# Patient Record
Sex: Female | Born: 1935 | Race: White | Hispanic: No | Smoking: Never smoker
Health system: Southern US, Community
[De-identification: ages and names within clinical notes are randomized; demographics above are authoritative.]

## PROBLEM LIST (undated history)

## (undated) DIAGNOSIS — F419 Anxiety disorder, unspecified: Secondary | ICD-10-CM

## (undated) DIAGNOSIS — I10 Essential (primary) hypertension: Secondary | ICD-10-CM

## (undated) DIAGNOSIS — G250 Essential tremor: Secondary | ICD-10-CM

## (undated) DIAGNOSIS — K589 Irritable bowel syndrome without diarrhea: Secondary | ICD-10-CM

## (undated) DIAGNOSIS — F32A Depression, unspecified: Secondary | ICD-10-CM

## (undated) DIAGNOSIS — N189 Chronic kidney disease, unspecified: Secondary | ICD-10-CM

## (undated) HISTORY — PX: EYE SURGERY: SHX253

---

## 2019-10-01 ENCOUNTER — Emergency Department (HOSPITAL_COMMUNITY): Payer: Medicare Other

## 2019-10-01 ENCOUNTER — Inpatient Hospital Stay (HOSPITAL_COMMUNITY)
Admission: EM | Admit: 2019-10-01 | Discharge: 2019-10-07 | DRG: 037 | Disposition: A | Discharge status: Rehabilitation Facility | Payer: Medicare Other | Attending: Internal Medicine | Admitting: Internal Medicine

## 2019-10-01 ENCOUNTER — Encounter (HOSPITAL_COMMUNITY): Payer: Self-pay | Admitting: Emergency Medicine

## 2019-10-01 ENCOUNTER — Inpatient Hospital Stay (HOSPITAL_COMMUNITY): Payer: Medicare Other

## 2019-10-01 ENCOUNTER — Other Ambulatory Visit: Payer: Self-pay

## 2019-10-01 DIAGNOSIS — R54 Age-related physical debility: Secondary | ICD-10-CM | POA: Diagnosis present

## 2019-10-01 DIAGNOSIS — R000 Tachycardia, unspecified: Secondary | ICD-10-CM | POA: Diagnosis not present

## 2019-10-01 DIAGNOSIS — E876 Hypokalemia: Secondary | ICD-10-CM | POA: Diagnosis not present

## 2019-10-01 DIAGNOSIS — I69328 Other speech and language deficits following cerebral infarction: Secondary | ICD-10-CM | POA: Diagnosis not present

## 2019-10-01 DIAGNOSIS — R2689 Other abnormalities of gait and mobility: Secondary | ICD-10-CM | POA: Diagnosis present

## 2019-10-01 DIAGNOSIS — Z66 Do not resuscitate: Secondary | ICD-10-CM | POA: Diagnosis not present

## 2019-10-01 DIAGNOSIS — N1831 Chronic kidney disease, stage 3a: Secondary | ICD-10-CM | POA: Diagnosis not present

## 2019-10-01 DIAGNOSIS — R29703 NIHSS score 3: Secondary | ICD-10-CM | POA: Diagnosis present

## 2019-10-01 DIAGNOSIS — R1310 Dysphagia, unspecified: Secondary | ICD-10-CM | POA: Diagnosis not present

## 2019-10-01 DIAGNOSIS — Z20822 Contact with and (suspected) exposure to covid-19: Secondary | ICD-10-CM | POA: Diagnosis not present

## 2019-10-01 DIAGNOSIS — I6522 Occlusion and stenosis of left carotid artery: Secondary | ICD-10-CM

## 2019-10-01 DIAGNOSIS — E46 Unspecified protein-calorie malnutrition: Secondary | ICD-10-CM | POA: Diagnosis not present

## 2019-10-01 DIAGNOSIS — K6811 Postprocedural retroperitoneal abscess: Secondary | ICD-10-CM | POA: Diagnosis not present

## 2019-10-01 DIAGNOSIS — I69318 Other symptoms and signs involving cognitive functions following cerebral infarction: Secondary | ICD-10-CM | POA: Diagnosis not present

## 2019-10-01 DIAGNOSIS — F329 Major depressive disorder, single episode, unspecified: Secondary | ICD-10-CM | POA: Diagnosis not present

## 2019-10-01 DIAGNOSIS — F411 Generalized anxiety disorder: Secondary | ICD-10-CM | POA: Diagnosis present

## 2019-10-01 DIAGNOSIS — I6389 Other cerebral infarction: Secondary | ICD-10-CM | POA: Diagnosis not present

## 2019-10-01 DIAGNOSIS — Z23 Encounter for immunization: Secondary | ICD-10-CM | POA: Diagnosis not present

## 2019-10-01 DIAGNOSIS — R4701 Aphasia: Secondary | ICD-10-CM | POA: Diagnosis present

## 2019-10-01 DIAGNOSIS — N179 Acute kidney failure, unspecified: Secondary | ICD-10-CM | POA: Diagnosis not present

## 2019-10-01 DIAGNOSIS — F32A Depression, unspecified: Secondary | ICD-10-CM | POA: Diagnosis present

## 2019-10-01 DIAGNOSIS — T148XXA Other injury of unspecified body region, initial encounter: Secondary | ICD-10-CM | POA: Diagnosis not present

## 2019-10-01 DIAGNOSIS — I10 Essential (primary) hypertension: Secondary | ICD-10-CM | POA: Diagnosis present

## 2019-10-01 DIAGNOSIS — R29810 Facial weakness: Secondary | ICD-10-CM | POA: Diagnosis present

## 2019-10-01 DIAGNOSIS — E785 Hyperlipidemia, unspecified: Secondary | ICD-10-CM | POA: Diagnosis present

## 2019-10-01 DIAGNOSIS — N390 Urinary tract infection, site not specified: Secondary | ICD-10-CM | POA: Diagnosis not present

## 2019-10-01 DIAGNOSIS — N189 Chronic kidney disease, unspecified: Secondary | ICD-10-CM | POA: Diagnosis not present

## 2019-10-01 DIAGNOSIS — K5900 Constipation, unspecified: Secondary | ICD-10-CM | POA: Diagnosis not present

## 2019-10-01 DIAGNOSIS — R0989 Other specified symptoms and signs involving the circulatory and respiratory systems: Secondary | ICD-10-CM | POA: Diagnosis not present

## 2019-10-01 DIAGNOSIS — Z8679 Personal history of other diseases of the circulatory system: Secondary | ICD-10-CM | POA: Diagnosis not present

## 2019-10-01 DIAGNOSIS — N183 Chronic kidney disease, stage 3 unspecified: Secondary | ICD-10-CM | POA: Diagnosis not present

## 2019-10-01 DIAGNOSIS — R636 Underweight: Secondary | ICD-10-CM | POA: Diagnosis present

## 2019-10-01 DIAGNOSIS — Z79899 Other long term (current) drug therapy: Secondary | ICD-10-CM

## 2019-10-01 DIAGNOSIS — M069 Rheumatoid arthritis, unspecified: Secondary | ICD-10-CM | POA: Diagnosis present

## 2019-10-01 DIAGNOSIS — G8191 Hemiplegia, unspecified affecting right dominant side: Secondary | ICD-10-CM | POA: Diagnosis present

## 2019-10-01 DIAGNOSIS — L89101 Pressure ulcer of unspecified part of back, stage 1: Secondary | ICD-10-CM | POA: Diagnosis present

## 2019-10-01 DIAGNOSIS — R7303 Prediabetes: Secondary | ICD-10-CM | POA: Diagnosis present

## 2019-10-01 DIAGNOSIS — E8809 Other disorders of plasma-protein metabolism, not elsewhere classified: Secondary | ICD-10-CM | POA: Diagnosis not present

## 2019-10-01 DIAGNOSIS — I129 Hypertensive chronic kidney disease with stage 1 through stage 4 chronic kidney disease, or unspecified chronic kidney disease: Secondary | ICD-10-CM | POA: Diagnosis present

## 2019-10-01 DIAGNOSIS — K580 Irritable bowel syndrome with diarrhea: Secondary | ICD-10-CM | POA: Diagnosis present

## 2019-10-01 DIAGNOSIS — I63312 Cerebral infarction due to thrombosis of left middle cerebral artery: Principal | ICD-10-CM | POA: Diagnosis present

## 2019-10-01 DIAGNOSIS — I69391 Dysphagia following cerebral infarction: Secondary | ICD-10-CM | POA: Diagnosis not present

## 2019-10-01 DIAGNOSIS — D62 Acute posthemorrhagic anemia: Secondary | ICD-10-CM | POA: Diagnosis not present

## 2019-10-01 DIAGNOSIS — G936 Cerebral edema: Secondary | ICD-10-CM | POA: Diagnosis present

## 2019-10-01 DIAGNOSIS — R471 Dysarthria and anarthria: Secondary | ICD-10-CM | POA: Diagnosis present

## 2019-10-01 DIAGNOSIS — Z811 Family history of alcohol abuse and dependence: Secondary | ICD-10-CM

## 2019-10-01 DIAGNOSIS — E43 Unspecified severe protein-calorie malnutrition: Secondary | ICD-10-CM | POA: Diagnosis present

## 2019-10-01 DIAGNOSIS — Z803 Family history of malignant neoplasm of breast: Secondary | ICD-10-CM

## 2019-10-01 DIAGNOSIS — I69322 Dysarthria following cerebral infarction: Secondary | ICD-10-CM | POA: Diagnosis not present

## 2019-10-01 DIAGNOSIS — K661 Hemoperitoneum: Secondary | ICD-10-CM | POA: Diagnosis present

## 2019-10-01 DIAGNOSIS — R1314 Dysphagia, pharyngoesophageal phase: Secondary | ICD-10-CM | POA: Diagnosis present

## 2019-10-01 DIAGNOSIS — I639 Cerebral infarction, unspecified: Secondary | ICD-10-CM | POA: Diagnosis present

## 2019-10-01 DIAGNOSIS — R531 Weakness: Secondary | ICD-10-CM | POA: Diagnosis not present

## 2019-10-01 DIAGNOSIS — I69390 Apraxia following cerebral infarction: Secondary | ICD-10-CM | POA: Diagnosis not present

## 2019-10-01 DIAGNOSIS — Z681 Body mass index (BMI) 19 or less, adult: Secondary | ICD-10-CM | POA: Diagnosis not present

## 2019-10-01 DIAGNOSIS — M720 Palmar fascial fibromatosis [Dupuytren]: Secondary | ICD-10-CM | POA: Diagnosis present

## 2019-10-01 DIAGNOSIS — L8990 Pressure ulcer of unspecified site, unspecified stage: Secondary | ICD-10-CM | POA: Insufficient documentation

## 2019-10-01 DIAGNOSIS — R4789 Other speech disturbances: Secondary | ICD-10-CM | POA: Diagnosis not present

## 2019-10-01 DIAGNOSIS — E871 Hypo-osmolality and hyponatremia: Secondary | ICD-10-CM | POA: Diagnosis present

## 2019-10-01 DIAGNOSIS — G250 Essential tremor: Secondary | ICD-10-CM | POA: Diagnosis present

## 2019-10-01 DIAGNOSIS — I63512 Cerebral infarction due to unspecified occlusion or stenosis of left middle cerebral artery: Secondary | ICD-10-CM

## 2019-10-01 DIAGNOSIS — M3119 Other thrombotic microangiopathy: Secondary | ICD-10-CM | POA: Diagnosis present

## 2019-10-01 DIAGNOSIS — I69351 Hemiplegia and hemiparesis following cerebral infarction affecting right dominant side: Secondary | ICD-10-CM | POA: Diagnosis present

## 2019-10-01 DIAGNOSIS — F419 Anxiety disorder, unspecified: Secondary | ICD-10-CM | POA: Diagnosis not present

## 2019-10-01 DIAGNOSIS — D72829 Elevated white blood cell count, unspecified: Secondary | ICD-10-CM | POA: Diagnosis not present

## 2019-10-01 DIAGNOSIS — I63232 Cerebral infarction due to unspecified occlusion or stenosis of left carotid arteries: Secondary | ICD-10-CM | POA: Diagnosis not present

## 2019-10-01 DIAGNOSIS — Z9889 Other specified postprocedural states: Secondary | ICD-10-CM | POA: Diagnosis not present

## 2019-10-01 HISTORY — DX: Irritable bowel syndrome, unspecified: K58.9

## 2019-10-01 HISTORY — DX: Depression, unspecified: F32.A

## 2019-10-01 HISTORY — DX: Anxiety disorder, unspecified: F41.9

## 2019-10-01 HISTORY — DX: Chronic kidney disease, unspecified: N18.9

## 2019-10-01 HISTORY — DX: Essential (primary) hypertension: I10

## 2019-10-01 HISTORY — DX: Essential tremor: G25.0

## 2019-10-01 LAB — CBC
HCT: 36.2 % (ref 36.0–46.0)
Hemoglobin: 11.2 g/dL — ABNORMAL LOW (ref 12.0–15.0)
MCH: 29.2 pg (ref 26.0–34.0)
MCHC: 30.9 g/dL (ref 30.0–36.0)
MCV: 94.5 fL (ref 80.0–100.0)
Platelets: 363 10*3/uL (ref 150–400)
RBC: 3.83 MIL/uL — ABNORMAL LOW (ref 3.87–5.11)
RDW: 13.8 % (ref 11.5–15.5)
WBC: 6.9 10*3/uL (ref 4.0–10.5)
nRBC: 0 % (ref 0.0–0.2)

## 2019-10-01 LAB — DIFFERENTIAL
Abs Immature Granulocytes: 0.02 10*3/uL (ref 0.00–0.07)
Basophils Absolute: 0.1 10*3/uL (ref 0.0–0.1)
Basophils Relative: 1 %
Eosinophils Absolute: 0.1 10*3/uL (ref 0.0–0.5)
Eosinophils Relative: 1 %
Immature Granulocytes: 0 %
Lymphocytes Relative: 24 %
Lymphs Abs: 1.7 10*3/uL (ref 0.7–4.0)
Monocytes Absolute: 0.7 10*3/uL (ref 0.1–1.0)
Monocytes Relative: 11 %
Neutro Abs: 4.4 10*3/uL (ref 1.7–7.7)
Neutrophils Relative %: 63 %

## 2019-10-01 LAB — I-STAT CHEM 8, ED
BUN: 24 mg/dL — ABNORMAL HIGH (ref 8–23)
Calcium, Ion: 1.18 mmol/L (ref 1.15–1.40)
Chloride: 104 mmol/L (ref 98–111)
Creatinine, Ser: 1.4 mg/dL — ABNORMAL HIGH (ref 0.44–1.00)
Glucose, Bld: 106 mg/dL — ABNORMAL HIGH (ref 70–99)
HCT: 35 % — ABNORMAL LOW (ref 36.0–46.0)
Hemoglobin: 11.9 g/dL — ABNORMAL LOW (ref 12.0–15.0)
Potassium: 4.7 mmol/L (ref 3.5–5.1)
Sodium: 137 mmol/L (ref 135–145)
TCO2: 22 mmol/L (ref 22–32)

## 2019-10-01 LAB — COMPREHENSIVE METABOLIC PANEL
ALT: 15 U/L (ref 0–44)
AST: 22 U/L (ref 15–41)
Albumin: 3.8 g/dL (ref 3.5–5.0)
Alkaline Phosphatase: 75 U/L (ref 38–126)
Anion gap: 4 — ABNORMAL LOW (ref 5–15)
BUN: 24 mg/dL — ABNORMAL HIGH (ref 8–23)
CO2: 22 mmol/L (ref 22–32)
Calcium: 9.8 mg/dL (ref 8.9–10.3)
Chloride: 111 mmol/L (ref 98–111)
Creatinine, Ser: 1.44 mg/dL — ABNORMAL HIGH (ref 0.44–1.00)
GFR calc Af Amer: 37 mL/min — ABNORMAL LOW (ref >60)
GFR calc non Af Amer: 32 mL/min — ABNORMAL LOW (ref >60)
Glucose, Bld: 112 mg/dL — ABNORMAL HIGH (ref 70–99)
Potassium: 4.7 mmol/L (ref 3.5–5.1)
Sodium: 137 mmol/L (ref 135–145)
Total Bilirubin: 0.7 mg/dL (ref 0.3–1.2)
Total Protein: 7.4 g/dL (ref 6.5–8.1)

## 2019-10-01 LAB — APTT: aPTT: 25 seconds (ref 24–36)

## 2019-10-01 LAB — RESPIRATORY PANEL BY RT PCR (FLU A&B, COVID)
Influenza A by PCR: NEGATIVE
Influenza B by PCR: NEGATIVE
SARS Coronavirus 2 by RT PCR: NEGATIVE

## 2019-10-01 LAB — HEMOGLOBIN A1C
Hgb A1c MFr Bld: 5.7 % — ABNORMAL HIGH (ref 4.8–5.6)
Mean Plasma Glucose: 116.89 mg/dL

## 2019-10-01 LAB — PROTIME-INR
INR: 1 (ref 0.8–1.2)
Prothrombin Time: 13.2 seconds (ref 11.4–15.2)

## 2019-10-01 LAB — CBG MONITORING, ED: Glucose-Capillary: 103 mg/dL — ABNORMAL HIGH (ref 70–99)

## 2019-10-01 MED ORDER — FLUOXETINE HCL 10 MG PO CAPS: 10.0000 mg | ORAL_CAPSULE | Freq: Every day | ORAL | Status: DC

## 2019-10-01 MED ORDER — SENNOSIDES-DOCUSATE SODIUM 8.6-50 MG PO TABS: 1.0000 | ORAL_TABLET | Freq: Every evening | ORAL | Status: DC | PRN

## 2019-10-01 MED ORDER — ATORVASTATIN CALCIUM 80 MG PO TABS
80.0000 mg | ORAL_TABLET | Freq: Every day | ORAL | Status: DC
  Administered 2019-10-01 – 2019-10-07 (×6): 80 mg via ORAL
  Filled 2019-10-01 (×5): qty 1

## 2019-10-01 MED ORDER — ASPIRIN 81 MG PO CHEW
81.0000 mg | CHEWABLE_TABLET | Freq: Every day | ORAL | Status: DC
  Administered 2019-10-01 – 2019-10-03 (×3): 81 mg via ORAL
  Filled 2019-10-01 (×3): qty 1

## 2019-10-01 MED ORDER — FLUOXETINE HCL 10 MG PO CAPS
10.0000 mg | ORAL_CAPSULE | Freq: Every day | ORAL | Status: DC
  Administered 2019-10-02 – 2019-10-07 (×6): 10 mg via ORAL
  Filled 2019-10-01 (×7): qty 1

## 2019-10-01 MED ORDER — ACETAMINOPHEN 650 MG RE SUPP: 650.0000 mg | RECTAL | Status: DC | PRN

## 2019-10-01 MED ORDER — STROKE: EARLY STAGES OF RECOVERY BOOK: Freq: Once | Status: DC

## 2019-10-01 MED ORDER — ACETAMINOPHEN 160 MG/5ML PO SOLN: 650.0000 mg | ORAL | Status: DC | PRN

## 2019-10-01 MED ORDER — SODIUM CHLORIDE 0.9% FLUSH
3.0000 mL | Freq: Once | INTRAVENOUS | Status: AC
  Administered 2019-10-01: 3 mL via INTRAVENOUS

## 2019-10-01 MED ORDER — ACETAMINOPHEN 325 MG PO TABS
650.0000 mg | ORAL_TABLET | ORAL | Status: DC | PRN
  Administered 2019-10-05 – 2019-10-06 (×2): 650 mg via ORAL
  Filled 2019-10-01 (×2): qty 2

## 2019-10-01 MED ORDER — IOHEXOL 350 MG/ML SOLN
100.0000 mL | Freq: Once | INTRAVENOUS | Status: AC | PRN
  Administered 2019-10-01: 100 mL via INTRAVENOUS

## 2019-10-01 MED ORDER — LACTATED RINGERS IV SOLN: INTRAVENOUS | Status: AC

## 2019-10-01 MED ORDER — ASPIRIN 300 MG RE SUPP: 300.0000 mg | Freq: Every day | RECTAL | Status: DC

## 2019-10-01 MED ORDER — ASPIRIN 325 MG PO TABS: 325.0000 mg | ORAL_TABLET | Freq: Every day | ORAL | Status: DC

## 2019-10-01 NOTE — Hospital Course (Addendum)
Hospital Course:  Acute Stroke: Patient presented to the [facility] on 10/01/19 after waking up with new onset R-sided weakness and difficulty speaking. Last known well time was >12 hours prior. Code stroke was called and CT head demonstrated a L parietal infarct; findings corroborated by MRI Brain. CTA neck revealed critical stenosis >90% of L internal carotid artery; findings corroborated by Vascular US carotid. TTE showed LVEF 70-75%, Grade I diastolic dysfunction, no valvular pathology, and no identified source of embolism. Patient was found to have profound weakness of RUE and moderate dysarthria with slurring of speech. Strength and sensation were intact elsewhere. Patient was out of the therapeutic window for thrombolysis. Permissive hypertension protocol was observed and patient received appropriate medical management with ASA, Plavix, and a statin. On 9/30, the patient's dysarthria and weakness were noted to be acutely worse but CT head was unchanged. It was determined that her worsened symptoms were likely related to post-stroke vasogenic edema and expected to improve with time.  Patient underwent L carotid endarterectomy on 10/1. On 10/2 her strength and speech were improved compared to previous. PT/OT/Speech recommended inpatient rehabilitation and patient was referred to CIR.    Follow-up recommendations:  Acute stroke:  Patient was living independently prior to stroke. Planning of moving in with her son and daughter-in-law after inpatient rehab.  - Reassess ADLs/IADLs  MDD: Anxiety: Patient with baseline depression/anxiety and at high risk of post-stroke depression.  - Suggest PHQ-9 at follow-up   HLD: Patient was previously on cholestyramine monotherapy. Started on atorvastatin 80mg  daily s/p stroke. - Recommend follow-up Lipid panel in 6-8 weeks.   Abnormal CT finding: CTA neck showed prominent pannus posterior to the dens with subluxation of C1 on C2 in a patient with ?history of  rheumatoid arthritis. Patient asymptomatic. - Consider rheumatology follow-up

## 2019-10-01 NOTE — Consult Note (Addendum)
Neurology Consult H&P  CC: Slurred speech, right sided weakness (face, arm, leg) and unable to walk this morning.  History is obtained from: Patient, chart, EMS, conveyed by her son.  HPI: Elizabeth Tapia is a 86 y.o. female who lives alone was found by her son to have slurred speech and right sided weakness. This was not her normal baseline and EMS called.   At baseline, she is independent and in good health which was the case when she went to bed last night.   LKW: 2000 on 09/30/2019 tpa given?: No, due to outside the window and possible hemorrhage. IR Thrombectomy? No Modified Rankin Scale: 1-No significant post stroke disability and can perform usual duties with stroke symptoms NIHSS: 3  ROS: A complete ROS was performed and is negative except as noted in the HPI.  History reviewed. No pertinent past medical history.  History reviewed. No pertinent family history.  Social History:  has no history on file for tobacco use, alcohol use, and drug use.  Prior to Admission medications   Not on File   Exam: Current vital signs: BP (!) 148/78   Pulse 88   Temp 98.4 F (36.9 C) (Oral)   Resp 17   SpO2 99%   Physical Exam  Constitutional: Appears well-developed and well-nourished.  Psych: Affect appropriate to situation Eyes: No scleral injection HENT: No OP obstrucion Head: Normocephalic.  Cardiovascular: Normal rate and regular rhythm.  Respiratory: Effort normal and breath sounds normal to anterior ascultation GI: Soft.  No distension. There is no tenderness.  Skin: WDI  Neuro: Mental Status: Patient is awake, alert, oriented to person, place, month, year, and situation. Patient is able to give a clear and coherent history. No signs of aphasia or neglect Cranial Nerves: II: Visual Fields are full. Pupils are equal, round, and reactive to light.   III,IV, VI: EOMI without ptosis or diploplia.  V: Facial sensation is symmetric to temperature VII: Facial movement is  symmetric.  VIII: hearing is intact to voice X: Uvula elevates symmetrically XI: Shoulder shrug is symmetric. XII: tongue is midline without atrophy or fasciculations.  Motor: Tone is normal. Bulk is normal. 5/5 strength was present in all four extremities.  Sensory: Sensation is symmetric to light touch and temperature in the arms and legs. Deep Tendon Reflexes: 2+ and symmetric in the biceps and patellae.  Plantars: Toes are downgoing bilaterally.  Cerebellar: FNF and HKS are intact bilaterally  I have reviewed the images obtained: Hypodensity left parietal cortex suggesting age indeterminate stroke. Small associated hyperdensity could represent hemorrhage. CTA neck showed critical stenosis in proximal left internal carotid artery >90%.     Primary Diagnosis:  Cerebral infarction due to thrombosis of left middle cerebral artery. - End artery.  Impression: 86 year old woman with stroke causing right sided weakness. The patient was coherent and able to state she thought she had a stroke. CT scan suggested possible petechial hemorrhage. CTA neck with proximal left internal carotid artery stenosis >90% may be likely source of stroke noted on CT and the patient will need further evaluation.  Plan: - Brain MRI.  - Carotid doppler. - Vascular surgery evaluation as this may be symptomatic stenosis. - Recommend TTE. - Recommend ordering Lipid panel. - Recommend Statin if LDL > 70 - Recommend obtaining HbA1c. - Aspirin 81mg  daily. - SBP goal - permissive hypertension first 24 h < 220/110. Hold home meds.  - Telemetry monitoring for arrhythmia. - Recommend bedside Swallow screen. - Recommend Stroke educatio -  Recommend PT/OT/SLP consult. - SCDs for now and consider chemical PPx if MRI is stable.  Discussed plan with ED and admission to hospitalist service.  This patient is critically ill and at significant risk of neurological worsening, death and care requires constant monitoring  of vital signs, hemodynamics,respiratory and cardiac monitoring, neurological assessment, discussion with family, other specialists and medical decision making of high complexity. I spent 75 minutes of neurocritical care time  in the care of  this patient. This was time spent independent of any time provided by nurse practitioner or PA.  Electronically signed by: Dr. Marisue Humble Pager: [phone]

## 2019-10-01 NOTE — H&P (Addendum)
Date: 10/01/2019               Patient Name:  Elizabeth Tapia MRN: [id number]  DOB: 02-25-1929 Age / Sex: 86 y.o., female   PCP: Tarri Fuller, MD              Medical Service: Internal Medicine Teaching Service              Attending Physician: Dr. Harden Mo    First Contact: Dr. Glenford Bayley Pager: [phone]  Second Contact: Dr. Lum Babe Pager: [phone]            After Hours (After 5p/  First Contact Pager: [phone]  weekends / holidays): Second Contact Pager: [phone]   Chief Complaint: Slurred speech, R sided weakness  History of Present Illness: History provided by patient and corroborated by her son. Elizabeth Tapia is an 86 year old female with a history of hypertension, hyperlipidemia, CKD IIIa, ?rheumatoid arthritis, anxiety, and depression who presents with an acute stroke. Patient was in her usual state of health last night and awoke this morning with slurred speech and R sided weakness. She was unable to walk. She lives with her grandson and called to him for help. She has no history of prior stroke or known vascular disease.  Her son reports that her speech is noticeably slurred compared to her baseline and that there is new facial asymmetry. She has a tremor most noticeable in her R arm that "has been there a long time" but is appreciably worse today.  Tremor is present at rest.  She denies vision changes, chest pain, SOB, or numbness.  She denies any dysuria, urinary frequency, or changes in bowel movements. She does endorse a slightly depressed appetite over the past few days and states that she "might have had a fever a few days ago, but it didn't amount to anything." She denies any cough, URI symptoms, or sick contacts. She is not vaccinated against COVID-19, but is willing to get her vaccine while in the hospital.   Brief ED Course- Code Stroke was activated and patient underwent CT w/o contrast which showed hypodensity of the L parietal cortex suggesting  stroke of indeterminate age and a small associated hyperdensity that could represent hemorrhage. CTA neck showed critical stenosis in proximal L internal carotid artery >90%. Because patient's last known well time was >12 hours ago and the possibility of hemorrhage, TPA was not administered. Patient admitted to IM teaching service for stroke workup.   Meds:  Current Meds  Medication Sig  . acetaminophen (TYLENOL) 500 MG tablet Take 500 mg by mouth every 6 (six) hours as needed for mild pain.  . cholestyramine (QUESTRAN) 4 g packet Take 1 packet by mouth 3 (three) times daily.  . diazepam (VALIUM) 2 MG tablet Take 2 mg by mouth every 12 (twelve) hours as needed for anxiety.  Marland Kitchen FLUoxetine (PROZAC) 10 MG capsule Take 10 mg by mouth daily.  . propranolol (INDERAL) 60 MG tablet Take 60 mg by mouth 2 (two) times daily.     Allergies: Allergies as of 10/01/2019  . (No Known Allergies)   History reviewed. No pertinent past medical history.  Family History: Sister died of breast CA. No other known family history of CA, stroke, or heart disease.   Social History: Lives with grandson but is mostly independent. Manages her own medications but is closely monitored by a son who lives nearby.  No etOH, tobacco, or drug use.   Review of Systems: A  complete ROS was negative except as per HPI.   Physical Exam: Blood pressure (!) 167/68, pulse 91, temperature 98.4 F (36.9 C), temperature source Oral, resp. rate 20, SpO2 98 %. Physical Exam Vitals reviewed.  Constitutional:      General: She is not in acute distress. Eyes:     Extraocular Movements: Extraocular movements intact.     Pupils: Pupils are equal, round, and reactive to light.  Neck:     Comments: Carotid bruit noted on L side. No bruit appreciated on R side.  Cardiovascular:     Rate and Rhythm: Normal rate and regular rhythm.  Neurological:     Mental Status: She is alert.     Cranial Nerves: Facial asymmetry (droop of lower R  face) present.     Sensory: Sensation is intact.     Motor: Tremor (R upper extremity) present.     Comments: Strength 3/5 in R upper extremity Strength 5/5 in LUE and bilateral LE Patient unable to perform finger to nose test due to tremor but heel to shin test was normal.  Psychiatric:        Mood and Affect: Mood and affect normal.        Speech: Speech is slurred.        Behavior: Behavior normal.        Thought Content: Thought content normal.     EKG: personally reviewed my interpretation is normal sinus rhythm.  Assessment & Plan by Problem: Elizabeth Tapia is an 86 yo woman with a history of HTN, HLD, CKD IIIa, anxiety, and depression admitted for workup of a stroke.  Active Problems:   Stroke [facility])  Stroke: Patient with hypertension and hyperlipidemia presenting with new onset slurred speech and R sided weakness. CT head consistent with ischemic stroke and possible petechial hemorrhage. CTA neck with critical stenosis of L carotid artery >90% and audible L-sided carotid bruit on exam. This may be the source of embolic stroke and warrants further workup to mitigate further stroke.  - Neurology following, appreciate their recommendations for the following:  - Brain MRI.   -Carotid doppler.  - Vascular surgery evaluation as this may be symptomatic stenosis.  -TTE.  - SCDs for now and consider chemical PPx if MRI is stable -A1c today - Initiate 81mg  daily. - Permissive hypertension first 24 h < 220/110. Hold home meds.  - Telemetry monitoring for arrhythmia. -Swallow study  - Diet per SLP -PT/OT/SLP consult.  Hyperlipidemia: - Hold home cholestyramine for now.  - Lipid panel  - Add statin if LDL >70  Hypertension: CKD IIIa: - Hold home propranolol during permissive hypertensive period as above  Depression: Anxiety: - Continue home Prozac 10mg  daily  Dispo: Admit patient to Inpatient with expected length of stay greater than 2 midnights.  Signed: , Medical Student 10/01/2019, 4:07 PM  Pager: [phone]  Attestation for Student Documentation:  I personally was present and performed or re-performed the history, physical exam and medical decision-making activities of this service and have verified that the service and findings are accurately documented in the student's note.  10/03/2019, MD 10/01/2019, 5:12 PM

## 2019-10-01 NOTE — ED Triage Notes (Signed)
Pt here via [facility] as code stroke. Pt have trouble ambulating, slurred speech, right sided weakness and right sided facial droop. Pt LKW was 9/27 2000.

## 2019-10-01 NOTE — ED Notes (Signed)
Attempted to call report x1, states that charge nurse needs to review

## 2019-10-01 NOTE — Progress Notes (Signed)
REASON FOR CONSULT:    Symptomatic left carotid stenosis.  The consult is requested by neurology.  ASSESSMENT & PLAN:   SYMPTOMATIC LEFT CAROTID STENOSIS: This patient presents with a left brain stroke and is found to have a very tight left carotid stenosis.  Depending upon how she does over the next few days she may be a candidate for left carotid endarterectomy.  She seems fairly with it for her age.  She has been started on aspirin.  I do not think that she is on a statin which I would recommend.  I would also recommend considering IV heparin given the severity of the stenosis if neurology does not think there are any signs of hemorrhage.  I will follow and consider left carotid endarterectomy.  I have discussed this with the patient.  We discussed the procedure potential complications.  She seems to understand.  Her carotid duplex and echocardiogram have been ordered but are pending.   Waverly Ferrari, MD Office: [phone]   HPI:   Elizabeth Tapia is a pleasant 86 y.o. female, who tells me that she developed the sudden onset of right-sided weakness at 10 PM last night.  However the records say this occurred this morning.  She reportedly had right sided weakness with slurred speech and facial droop on the right.  She lives independently but tells me that she has help at her home most of the time.  She is widowed.  She is right-handed.  She denies any previous history of stroke, TIAs, expressive or receptive aphasia, or amaurosis fugax.  She tells me that she occasionally takes aspirin but not routinely.  She is not on a statin.  She has no real risk factors for peripheral vascular disease.  She denies any history of diabetes, hypertension, hypercholesterolemia, family history of premature cardiovascular disease, or smoking history.  History reviewed. No pertinent past medical history.  History reviewed. No pertinent family history.  SOCIAL HISTORY: She is widowed.  She has 3  children 2 of which are living.  She is not a smoker. Social History   Socioeconomic History  . Marital status: Unknown    Spouse name: Not on file  . Number of children: Not on file  . Years of education: Not on file  . Highest education level: Not on file  Occupational History  . Not on file  Tobacco Use  . Smoking status: Not on file  Substance and Sexual Activity  . Alcohol use: Not on file  . Drug use: Not on file  . Sexual activity: Not on file  Other Topics Concern  . Not on file  Social History Narrative  . Not on file   Social Determinants of Health   Financial Resource Strain:   . Difficulty of Paying Living Expenses: Not on file  Food Insecurity:   . Worried About Programme researcher, broadcasting/film/video in the Last Year: Not on file  . Ran Out of Food in the Last Year: Not on file  Transportation Needs:   . Lack of Transportation (Medical): Not on file  . Lack of Transportation (Non-Medical): Not on file  Physical Activity:   . Days of Exercise per Week: Not on file  . Minutes of Exercise per Session: Not on file  Stress:   . Feeling of Stress : Not on file  Social Connections:   . Frequency of Communication with Friends and Family: Not on file  . Frequency of Social Gatherings with Friends and Family: Not on file  .  Attends Religious Services: Not on file  . Active Member of Clubs or Organizations: Not on file  . Attends Banker Meetings: Not on file  . Marital Status: Not on file  Intimate Partner Violence:   . Fear of Current or Ex-Partner: Not on file  . Emotionally Abused: Not on file  . Physically Abused: Not on file  . Sexually Abused: Not on file    No Known Allergies  Current Facility-Administered Medications  Medication Dose Route Frequency Provider Last Rate Last Admin  .  stroke: mapping our early stages of recovery book   Does not apply Once Masoudi, Shawna Orleans, MD      . acetaminophen (TYLENOL) tablet 650 mg  650 mg Oral Q4H PRN Masoudi,  Elhamalsadat, MD       Or  . acetaminophen (TYLENOL) 160 MG/5ML solution 650 mg  650 mg Per Tube Q4H PRN Masoudi, Elhamalsadat, MD       Or  . acetaminophen (TYLENOL) suppository 650 mg  650 mg Rectal Q4H PRN Masoudi, Elhamalsadat, MD      . aspirin chewable tablet 81 mg  81 mg Oral Daily Masoudi, Elhamalsadat, MD   81 mg at 10/01/19 1835  . FLUoxetine (PROZAC) capsule 10 mg  10 mg Oral Daily Masoudi, Elhamalsadat, MD      . senna-docusate (Senokot-S) tablet 1 tablet  1 tablet Oral QHS PRN Masoudi, Elhamalsadat, MD      . sodium chloride flush (NS) 0.9 % injection 3 mL  3 mL Intravenous Once Cathren Laine, MD       Current Outpatient Medications  Medication Sig Dispense Refill  . acetaminophen (TYLENOL) 500 MG tablet Take 500 mg by mouth every 6 (six) hours as needed for mild pain.    . cholestyramine (QUESTRAN) 4 g packet Take 1 packet by mouth 3 (three) times daily.    . diazepam (VALIUM) 2 MG tablet Take 2 mg by mouth every 12 (twelve) hours as needed for anxiety.    Marland Kitchen FLUoxetine (PROZAC) 10 MG capsule Take 10 mg by mouth daily.    . propranolol (INDERAL) 60 MG tablet Take 60 mg by mouth 2 (two) times daily.      REVIEW OF SYSTEMS:  [X]  denotes positive finding, [ ]  denotes negative finding Cardiac  Comments:  Chest pain or chest pressure:    Shortness of breath upon exertion: x   Short of breath when lying flat:    Irregular heart rhythm:        Vascular    Pain in calf, thigh, or hip brought on by ambulation:    Pain in feet at night that wakes you up from your sleep:     Blood clot in your veins:    Leg swelling:         Pulmonary    Oxygen at home:    Productive cough:     Wheezing:         Neurologic    Sudden weakness in arms or legs:  x  right arm  Sudden numbness in arms or legs:  x  right arm  Sudden onset of difficulty speaking or slurred speech:    Temporary loss of vision in one eye:     Problems with dizziness:         Gastrointestinal    Blood in stool:      Vomited blood:         Genitourinary    Burning when urinating:     Blood  in urine:        Psychiatric    Major depression:         Hematologic    Bleeding problems:    Problems with blood clotting too easily:        Skin    Rashes or ulcers:        Constitutional    Fever or chills:     PHYSICAL EXAM:   Vitals:   10/01/19 1615 10/01/19 1800 10/01/19 1815 10/01/19 1830  BP: (!) 161/59 (!) 154/81 (!) 163/86 (!) 164/79  Pulse: 85 100 100 (!) 102  Resp: 18 (!) 23    Temp:      TempSrc:      SpO2: 99% 97% 96% 96%    GENERAL: The patient is a well-nourished female, in no acute distress. The vital signs are documented above. CARDIAC: There is a regular rate and rhythm.  VASCULAR: I do not detect carotid bruits. She has palpable femoral, popliteal, dorsalis pedis, and posterior tibial pulses bilaterally. She has no significant lower extremity swelling. PULMONARY: There is good air exchange bilaterally without wheezing or rales. ABDOMEN: Soft and non-tender with normal pitched bowel sounds.  MUSCULOSKELETAL: There are no major deformities or cyanosis. NEUROLOGIC: She has significant right upper extremity weakness.  She has no significant lower extremity weakness.  She has good strength on the left side. SKIN: There are no ulcers or rashes noted. PSYCHIATRIC: The patient has a normal affect.  DATA:    CT ANGIOGRAM NECK: I have reviewed her CT angiogram of the neck.  She has a very tight possible short segment occlusion of the proximal ICA on the left.  There is no significant right carotid stenosis.  CT angiogram of the head shows possible distal arterial occlusion in the left MCA territory.  She has an area of hypoperfusion in the left parietal lobe corresponding to the area of hypodensity seen on her CT of the head.  CT HEAD: CT of the head shows a hypodensity in the left parietal cortex compatible with infarction.  MR BRAIN: I have reviewed her MRI of the brain.  This  shows multiple foci of acute infarcts within the left frontal and parietal lobes in a watershed distribution.  The most confluent area of acute infarct is in the left parietal lobe in the distal left MCA territory.  This corresponds with the finding on CT.  EKG: She has a normal sinus rhythm.

## 2019-10-01 NOTE — Progress Notes (Addendum)
Discussed Elizabeth Tapia with on-call neurology team regarding vascular surgery's recommendation for considering IV heparin until carotid endarterectomy. Advised to not initiate heparin at this time due to risk of hemorrhage and to maintain permissive hypertension for increased cerebral perfusion. Fluids started to keep bp elevated.

## 2019-10-01 NOTE — ED Notes (Signed)
Transportation requested  

## 2019-10-01 NOTE — Code Documentation (Signed)
Patient from home where she lives alone and her son checks up on her. She is typically very independent. She was LKW last night 09/30/19 at 2000. This am she was found unable to walk, right sided weakness, speech slurred and a right facial droop. [facility] was called and a code stroke was paged out. Pt taken to [facility] and met by RN and SRN. Neurologist met pt in [place]. A CT/CTA/CTP all completed. Pt's initial NIHSS 3 for right drift in upper and lower and mild dysarthria. BP 173/64, CBG WNL. She is outside the window for TPA and her scans were negative for LVO per MD. Care Plan: q2x12 then q4 vitals/mNIHSS. Hand off with Deidre Ala, RN. Elizabeth Tapia, Elizabeth Brunt, RN

## 2019-10-01 NOTE — ED Notes (Signed)
Pt remains in MRI 

## 2019-10-01 NOTE — ED Notes (Signed)
Attempted to call report x2

## 2019-10-01 NOTE — ED Provider Notes (Addendum)
[facility] EMERGENCY DEPARTMENT Provider Note   CSN: [id number] Arrival date & time: 10/01/19  1029     History Chief Complaint  Patient presents with  . Cerebrovascular Accident    Elizabeth Tapia is a 86 y.o. female.  Patient arrives as code stroke activation - patient noted to have right sided weakness, and slurred speech this AM. Symptoms acute onset, moderate, constant, persistent. Patient was last noted to be at her baseline around 8 PM last night. Pt denies headache. No chest pain or discomfort. No sob. Denies change in vision. Denies numbness. Pt does not recall having similar symptoms previously.   The history is provided by the patient and the EMS personnel. The history is limited by the condition of the patient.       History reviewed. No pertinent past medical history.  There are no problems to display for this patient.   History reviewed. No pertinent surgical history.   OB History   No obstetric history on file.     History reviewed. No pertinent family history.  Social History   Tobacco Use  . Smoking status: Not on file  Substance Use Topics  . Alcohol use: Not on file  . Drug use: Not on file    Home Medications Prior to Admission medications   Not on File    Allergies    Patient has no allergy information on record.  Review of Systems   Review of Systems  Constitutional: Negative for chills and fever.  HENT: Negative for trouble swallowing.   Eyes: Negative for visual disturbance.  Respiratory: Negative for shortness of breath.   Cardiovascular: Negative for chest pain.  Gastrointestinal: Negative for abdominal pain.  Genitourinary: Negative for dysuria.  Musculoskeletal: Negative for back pain and neck pain.  Skin: Negative for rash.  Neurological: Positive for weakness. Negative for headaches.  Hematological: Does not bruise/bleed easily.  Psychiatric/Behavioral: Negative for agitation.    Physical  Exam Updated Vital Signs BP (!) 173/64   Pulse 97   Temp 98.4 F (36.9 C)   Physical Exam Vitals and nursing note reviewed.  Constitutional:      Appearance: Normal appearance. She is well-developed.  HENT:     Head: Atraumatic.     Nose: Nose normal.     Mouth/Throat:     Mouth: Mucous membranes are moist.  Eyes:     General: No scleral icterus.    Conjunctiva/sclera: Conjunctivae normal.     Pupils: Pupils are equal, round, and reactive to light.  Neck:     Vascular: No carotid bruit.     Trachea: No tracheal deviation.  Cardiovascular:     Rate and Rhythm: Normal rate and regular rhythm.     Pulses: Normal pulses.     Heart sounds: Normal heart sounds. No murmur heard.  No friction rub. No gallop.   Pulmonary:     Effort: Pulmonary effort is normal. No respiratory distress.     Breath sounds: Normal breath sounds.  Abdominal:     General: Bowel sounds are normal. There is no distension.     Palpations: Abdomen is soft.     Tenderness: There is no abdominal tenderness. There is no guarding.  Genitourinary:    Comments: No cva tenderness.  Musculoskeletal:        General: No swelling.     Cervical back: Normal range of motion and neck supple. No rigidity. No muscular tenderness.  Skin:    General: Skin is warm and  dry.     Findings: No rash.  Neurological:     Mental Status: She is alert.     Comments: Alert, speech relatively clear ?mild dysarthria. Right sided weakness. Right pronator drift.   Psychiatric:        Mood and Affect: Mood normal.     ED Results / Procedures / Treatments   Labs (all labs ordered are listed, but only abnormal results are displayed) Results for orders placed or performed during the hospital encounter of 10/01/19  Protime-INR  Result Value Ref Range   Prothrombin Time 13.2 11.4 - 15.2 seconds   INR 1.0 0.8 - 1.2  APTT  Result Value Ref Range   aPTT 25 24 - 36 seconds  CBC  Result Value Ref Range   WBC 6.9 4.0 - 10.5 K/uL    RBC 3.83 (L) 3.87 - 5.11 MIL/uL   Hemoglobin 11.2 (L) 12.0 - 15.0 g/dL   HCT 78.2 36 - 46 %   MCV 94.5 80.0 - 100.0 fL   MCH 29.2 26.0 - 34.0 pg   MCHC 30.9 30.0 - 36.0 g/dL   RDW 95.6 21.3 - 08.6 %   Platelets 363 150 - 400 K/uL   nRBC 0.0 0.0 - 0.2 %  Differential  Result Value Ref Range   Neutrophils Relative % 63 %   Neutro Abs 4.4 1.7 - 7.7 K/uL   Lymphocytes Relative 24 %   Lymphs Abs 1.7 0.7 - 4.0 K/uL   Monocytes Relative 11 %   Monocytes Absolute 0.7 0 - 1 K/uL   Eosinophils Relative 1 %   Eosinophils Absolute 0.1 0 - 0 K/uL   Basophils Relative 1 %   Basophils Absolute 0.1 0 - 0 K/uL   Immature Granulocytes 0 %   Abs Immature Granulocytes 0.02 0.00 - 0.07 K/uL  CBG monitoring, ED  Result Value Ref Range   Glucose-Capillary 103 (H) 70 - 99 mg/dL  I-stat chem 8, ED  Result Value Ref Range   Sodium 137 135 - 145 mmol/L   Potassium 4.7 3.5 - 5.1 mmol/L   Chloride 104 98 - 111 mmol/L   BUN 24 (H) 8 - 23 mg/dL   Creatinine, Ser 5.78 (H) 0.44 - 1.00 mg/dL   Glucose, Bld 469 (H) 70 - 99 mg/dL   Calcium, Ion 6.29 5.28 - 1.40 mmol/L   TCO2 22 22 - 32 mmol/L   Hemoglobin 11.9 (L) 12.0 - 15.0 g/dL   HCT 41.3 (L) 36 - 46 %   CT CEREBRAL PERFUSION W CONTRAST  Result Date: 10/01/2019 CLINICAL DATA:  Acute neuro deficit. Right facial droop and right-sided weakness. Slurred speech. EXAM: CT ANGIOGRAPHY HEAD AND NECK TECHNIQUE: Multidetector CT imaging of the head and neck was performed using the standard protocol during bolus administration of intravenous contrast. Multiplanar CT image reconstructions and MIPs were obtained to evaluate the vascular anatomy. Carotid stenosis measurements (when applicable) are obtained utilizing NASCET criteria, using the distal internal carotid diameter as the denominator. CONTRAST:  OMNIPAQUE IOHEXOL 350 MG/ML SOLN COMPARISON:  CT head 10/01/2019 FINDINGS: CTA NECK FINDINGS Aortic arch: Atherosclerotic aortic arch. Proximal great vessels  widely patent. 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 Right carotid system: Mild atherosclerotic disease right carotid bifurcation without significant stenosis. Left carotid system: High-grade stenosis proximal left internal carotid artery with irregular lumen. This may be soft plaque or plaque rupture. This could be a source of emboli. Moderate stenosis proximal left external carotid artery. Vertebral arteries: Both  vertebral arteries widely patent to the basilar without stenosis. Skeleton: Cervical spondylosis.  No acute skeletal abnormality. Other neck: Prominent pannus posterior to the dens compatible with arthropathy. This is causing mild flattening of the ventral cord and mild spinal stenosis. Rule out rheumatoid arthritis or CPPD. No bony erosion identified. C1 is subluxed to the right which is likely due to ligament laxity. No fracture identified. Upper chest: Apical scarring bilaterally. No acute abnormality in the lung apices. Review of the MIP images confirms the above findings CTA HEAD FINDINGS Anterior circulation: Mild atherosclerotic disease in the cavernous carotid bilaterally without significant stenosis. Right middle cerebral artery widely patent. Hypoplastic left A1 segment. Both anterior cerebral arteries are patent from the right. Left M1 segment widely patent. No large vessel occlusion. There is an area of hypoperfusion in the left parietal lobe where there is an area of infarct on CT. This could be acute. Posterior circulation: Both vertebral arteries patent to the basilar. PICA patent bilaterally. Basilar widely patent. Right AICA patent. Superior cerebellar and posterior cerebral arteries patent bilaterally without stenosis or large vessel occlusion. Venous sinuses: Normal venous enhancement. Anatomic variants: None Review of the MIP images confirms the above findings IMPRESSION: 1. 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 String sinus  present with plaque irregularity. Possible source of emboli. 2. Area of hypoperfusion left parietal lobe on CTA corresponding to an area of hypodensity on CT. Possible distal arterial occlusion left MCA territory. I am not able to identify definite acute vessel occlusion. 3. Prominent pannus posterior to the dens with subluxation of C1 on C2 compatible with arthropathy and instability. Possible CPPD or rheumatoid arthritis. 4. These results were called by telephone at the time of interpretation on 10/01/2019 at 11:18 am to provider [facility] , who verbally acknowledged these results. Electronically Signed   By: Marlan Palau M.D.   On: 10/01/2019 11:19   CT HEAD CODE STROKE WO CONTRAST  Result Date: 10/01/2019 CLINICAL DATA:  Code stroke. Acute neuro deficit. Right facial droop and right-sided weakness. Slurred speech. EXAM: CT HEAD WITHOUT CONTRAST TECHNIQUE: Contiguous axial images were obtained from the base of the skull through the vertex without intravenous contrast. COMPARISON:  None. FINDINGS: Brain: Generalized atrophy. Extensive white matter hypodensity bilaterally. Hypodensity in the left parietal compatible with infarct possibly acute or subacute. This is approximately 2 cm in size. Small area of increased density within the hypodensity could represent acute hemorrhage. Vascular: Negative for hyperdense vessel Skull: Negative Sinuses/Orbits: Chronic bony thickening of the sphenoid sinus with associated air-fluid level. Remaining sinuses clear. Bilateral cataract extraction. Other: None ASPECTS (Alberta Stroke Program Early CT Score) - Ganglionic level infarction (caudate, lentiform nuclei, internal capsule, insula, M1-M3 cortex): 7 - Supraganglionic infarction (M4-M6 cortex): 2 Total score (0-10 with 10 being normal): 9 IMPRESSION: 1. Hypodensity left parietal cortex compatible with infarction which could be acute or subacute. Small associated hyperdensity could represent hemorrhage. No prior  studies for comparison 2. Generalized atrophy with extensive chronic microvascular ischemic change in the white matter 3. ASPECTS is 9 4. Code stroke imaging results were communicated on 10/01/2019 at 10:47 am to provider Thomasena Edis via Loretha Stapler Electronically Signed   By: Marlan Palau M.D.   On: 10/01/2019 10:51   CT ANGIO HEAD CODE STROKE  Result Date: 10/01/2019 CLINICAL DATA:  Acute neuro deficit. Right facial droop and right-sided weakness. Slurred speech. EXAM: CT ANGIOGRAPHY HEAD AND NECK TECHNIQUE: Multidetector CT imaging of the head and neck was performed using the  standard protocol during bolus administration of intravenous contrast. Multiplanar CT image reconstructions and MIPs were obtained to evaluate the vascular anatomy. Carotid stenosis measurements (when applicable) are obtained utilizing NASCET criteria, using the distal internal carotid diameter as the denominator. CONTRAST:  OMNIPAQUE IOHEXOL 350 MG/ML SOLN COMPARISON:  CT head 10/01/2019 FINDINGS: CTA NECK FINDINGS Aortic arch: Atherosclerotic aortic arch. Proximal great vessels widely patent. 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 Right carotid system: Mild atherosclerotic disease right carotid bifurcation without significant stenosis. Left carotid system: High-grade stenosis proximal left internal carotid artery with irregular lumen. This may be soft plaque or plaque rupture. This could be a source of emboli. Moderate stenosis proximal left external carotid artery. Vertebral arteries: Both vertebral arteries widely patent to the basilar without stenosis. Skeleton: Cervical spondylosis.  No acute skeletal abnormality. Other neck: Prominent pannus posterior to the dens compatible with arthropathy. This is causing mild flattening of the ventral cord and mild spinal stenosis. Rule out rheumatoid arthritis or CPPD. No bony erosion identified. C1 is subluxed to the right which is likely due to ligament laxity. No  fracture identified. Upper chest: Apical scarring bilaterally. No acute abnormality in the lung apices. Review of the MIP images confirms the above findings CTA HEAD FINDINGS Anterior circulation: Mild atherosclerotic disease in the cavernous carotid bilaterally without significant stenosis. Right middle cerebral artery widely patent. Hypoplastic left A1 segment. Both anterior cerebral arteries are patent from the right. Left M1 segment widely patent. No large vessel occlusion. There is an area of hypoperfusion in the left parietal lobe where there is an area of infarct on CT. This could be acute. Posterior circulation: Both vertebral arteries patent to the basilar. PICA patent bilaterally. Basilar widely patent. Right AICA patent. Superior cerebellar and posterior cerebral arteries patent bilaterally without stenosis or large vessel occlusion. Venous sinuses: Normal venous enhancement. Anatomic variants: None Review of the MIP images confirms the above findings IMPRESSION: 1. 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 String sinus present with plaque irregularity. Possible source of emboli. 2. Area of hypoperfusion left parietal lobe on CTA corresponding to an area of hypodensity on CT. Possible distal arterial occlusion left MCA territory. I am not able to identify definite acute vessel occlusion. 3. Prominent pannus posterior to the dens with subluxation of C1 on C2 compatible with arthropathy and instability. Possible CPPD or rheumatoid arthritis. 4. These results were called by telephone at the time of interpretation on 10/01/2019 at 11:18 am to provider [facility] , who verbally acknowledged these results. Electronically Signed   By: Marlan Palau M.D.   On: 10/01/2019 11:19   CT ANGIO NECK CODE STROKE  Result Date: 10/01/2019 CLINICAL DATA:  Acute neuro deficit. Right facial droop and right-sided weakness. Slurred speech. EXAM: CT ANGIOGRAPHY HEAD AND NECK TECHNIQUE:  Multidetector CT imaging of the head and neck was performed using the standard protocol during bolus administration of intravenous contrast. Multiplanar CT image reconstructions and MIPs were obtained to evaluate the vascular anatomy. Carotid stenosis measurements (when applicable) are obtained utilizing NASCET criteria, using the distal internal carotid diameter as the denominator. CONTRAST:  OMNIPAQUE IOHEXOL 350 MG/ML SOLN COMPARISON:  CT head 10/01/2019 FINDINGS: CTA NECK FINDINGS Aortic arch: Atherosclerotic aortic arch. Proximal great vessels widely patent. 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 Right carotid system: Mild atherosclerotic disease right carotid bifurcation without significant stenosis. Left carotid system: High-grade stenosis proximal left internal carotid artery with irregular lumen. This may be soft  plaque or plaque rupture. This could be a source of emboli. Moderate stenosis proximal left external carotid artery. Vertebral arteries: Both vertebral arteries widely patent to the basilar without stenosis. Skeleton: Cervical spondylosis.  No acute skeletal abnormality. Other neck: Prominent pannus posterior to the dens compatible with arthropathy. This is causing mild flattening of the ventral cord and mild spinal stenosis. Rule out rheumatoid arthritis or CPPD. No bony erosion identified. C1 is subluxed to the right which is likely due to ligament laxity. No fracture identified. Upper chest: Apical scarring bilaterally. No acute abnormality in the lung apices. Review of the MIP images confirms the above findings CTA HEAD FINDINGS Anterior circulation: Mild atherosclerotic disease in the cavernous carotid bilaterally without significant stenosis. Right middle cerebral artery widely patent. Hypoplastic left A1 segment. Both anterior cerebral arteries are patent from the right. Left M1 segment widely patent. No large vessel occlusion. There is an area of hypoperfusion  in the left parietal lobe where there is an area of infarct on CT. This could be acute. Posterior circulation: Both vertebral arteries patent to the basilar. PICA patent bilaterally. Basilar widely patent. Right AICA patent. Superior cerebellar and posterior cerebral arteries patent bilaterally without stenosis or large vessel occlusion. Venous sinuses: Normal venous enhancement. Anatomic variants: None Review of the MIP images confirms the above findings IMPRESSION: 1. 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 String sinus present with plaque irregularity. Possible source of emboli. 2. Area of hypoperfusion left parietal lobe on CTA corresponding to an area of hypodensity on CT. Possible distal arterial occlusion left MCA territory. I am not able to identify definite acute vessel occlusion. 3. Prominent pannus posterior to the dens with subluxation of C1 on C2 compatible with arthropathy and instability. Possible CPPD or rheumatoid arthritis. 4. These results were called by telephone at the time of interpretation on 10/01/2019 at 11:18 am to provider [facility] , who verbally acknowledged these results. Electronically Signed   By: Marlan Palau M.D.   On: 10/01/2019 11:19    ED ECG REPORT   Date: 10/01/2019  Rate: 84  Rhythm: normal sinus rhythm  QRS Axis: normal  Intervals: normal  ST/T Wave abnormalities: normal  Conduction Disutrbances:none  Narrative Interpretation:   Old EKG Reviewed: unchanged  I have personally reviewed the EKG tracing    Radiology CT CEREBRAL PERFUSION W CONTRAST  Result Date: 10/01/2019 CLINICAL DATA:  Acute neuro deficit. Right facial droop and right-sided weakness. Slurred speech. EXAM: CT ANGIOGRAPHY HEAD AND NECK TECHNIQUE: Multidetector CT imaging of the head and neck was performed using the standard protocol during bolus administration of intravenous contrast. Multiplanar CT image reconstructions and MIPs were obtained to  evaluate the vascular anatomy. Carotid stenosis measurements (when applicable) are obtained utilizing NASCET criteria, using the distal internal carotid diameter as the denominator. CONTRAST:  OMNIPAQUE IOHEXOL 350 MG/ML SOLN COMPARISON:  CT head 10/01/2019 FINDINGS: CTA NECK FINDINGS Aortic arch: Atherosclerotic aortic arch. Proximal great vessels widely patent. 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 Right carotid system: Mild atherosclerotic disease right carotid bifurcation without significant stenosis. Left carotid system: High-grade stenosis proximal left internal carotid artery with irregular lumen. This may be soft plaque or plaque rupture. This could be a source of emboli. Moderate stenosis proximal left external carotid artery. Vertebral arteries: Both vertebral arteries widely patent to the basilar without stenosis. Skeleton: Cervical spondylosis.  No acute skeletal abnormality. Other neck: Prominent pannus posterior to the dens compatible with arthropathy. This is causing  mild flattening of the ventral cord and mild spinal stenosis. Rule out rheumatoid arthritis or CPPD. No bony erosion identified. C1 is subluxed to the right which is likely due to ligament laxity. No fracture identified. Upper chest: Apical scarring bilaterally. No acute abnormality in the lung apices. Review of the MIP images confirms the above findings CTA HEAD FINDINGS Anterior circulation: Mild atherosclerotic disease in the cavernous carotid bilaterally without significant stenosis. Right middle cerebral artery widely patent. Hypoplastic left A1 segment. Both anterior cerebral arteries are patent from the right. Left M1 segment widely patent. No large vessel occlusion. There is an area of hypoperfusion in the left parietal lobe where there is an area of infarct on CT. This could be acute. Posterior circulation: Both vertebral arteries patent to the basilar. PICA patent bilaterally. Basilar widely  patent. Right AICA patent. Superior cerebellar and posterior cerebral arteries patent bilaterally without stenosis or large vessel occlusion. Venous sinuses: Normal venous enhancement. Anatomic variants: None Review of the MIP images confirms the above findings IMPRESSION: 1. 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 String sinus present with plaque irregularity. Possible source of emboli. 2. Area of hypoperfusion left parietal lobe on CTA corresponding to an area of hypodensity on CT. Possible distal arterial occlusion left MCA territory. I am not able to identify definite acute vessel occlusion. 3. Prominent pannus posterior to the dens with subluxation of C1 on C2 compatible with arthropathy and instability. Possible CPPD or rheumatoid arthritis. 4. These results were called by telephone at the time of interpretation on 10/01/2019 at 11:18 am to provider [facility] , who verbally acknowledged these results. Electronically Signed   By: Marlan Palau M.D.   On: 10/01/2019 11:19   CT HEAD CODE STROKE WO CONTRAST  Result Date: 10/01/2019 CLINICAL DATA:  Code stroke. Acute neuro deficit. Right facial droop and right-sided weakness. Slurred speech. EXAM: CT HEAD WITHOUT CONTRAST TECHNIQUE: Contiguous axial images were obtained from the base of the skull through the vertex without intravenous contrast. COMPARISON:  None. FINDINGS: Brain: Generalized atrophy. Extensive white matter hypodensity bilaterally. Hypodensity in the left parietal compatible with infarct possibly acute or subacute. This is approximately 2 cm in size. Small area of increased density within the hypodensity could represent acute hemorrhage. Vascular: Negative for hyperdense vessel Skull: Negative Sinuses/Orbits: Chronic bony thickening of the sphenoid sinus with associated air-fluid level. Remaining sinuses clear. Bilateral cataract extraction. Other: None ASPECTS (Alberta Stroke Program Early CT Score) -  Ganglionic level infarction (caudate, lentiform nuclei, internal capsule, insula, M1-M3 cortex): 7 - Supraganglionic infarction (M4-M6 cortex): 2 Total score (0-10 with 10 being normal): 9 IMPRESSION: 1. Hypodensity left parietal cortex compatible with infarction which could be acute or subacute. Small associated hyperdensity could represent hemorrhage. No prior studies for comparison 2. Generalized atrophy with extensive chronic microvascular ischemic change in the white matter 3. ASPECTS is 9 4. Code stroke imaging results were communicated on 10/01/2019 at 10:47 am to provider Thomasena Edis via Loretha Stapler Electronically Signed   By: Marlan Palau M.D.   On: 10/01/2019 10:51   CT ANGIO HEAD CODE STROKE  Result Date: 10/01/2019 CLINICAL DATA:  Acute neuro deficit. Right facial droop and right-sided weakness. Slurred speech. EXAM: CT ANGIOGRAPHY HEAD AND NECK TECHNIQUE: Multidetector CT imaging of the head and neck was performed using the standard protocol during bolus administration of intravenous contrast. Multiplanar CT image reconstructions and MIPs were obtained to evaluate the vascular anatomy. Carotid stenosis measurements (when applicable) are obtained utilizing NASCET criteria,  using the distal internal carotid diameter as the denominator. CONTRAST:  OMNIPAQUE IOHEXOL 350 MG/ML SOLN COMPARISON:  CT head 10/01/2019 FINDINGS: CTA NECK FINDINGS Aortic arch: Atherosclerotic aortic arch. Proximal great vessels widely patent. 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 Right carotid system: Mild atherosclerotic disease right carotid bifurcation without significant stenosis. Left carotid system: High-grade stenosis proximal left internal carotid artery with irregular lumen. This may be soft plaque or plaque rupture. This could be a source of emboli. Moderate stenosis proximal left external carotid artery. Vertebral arteries: Both vertebral arteries widely patent to the basilar without stenosis.  Skeleton: Cervical spondylosis.  No acute skeletal abnormality. Other neck: Prominent pannus posterior to the dens compatible with arthropathy. This is causing mild flattening of the ventral cord and mild spinal stenosis. Rule out rheumatoid arthritis or CPPD. No bony erosion identified. C1 is subluxed to the right which is likely due to ligament laxity. No fracture identified. Upper chest: Apical scarring bilaterally. No acute abnormality in the lung apices. Review of the MIP images confirms the above findings CTA HEAD FINDINGS Anterior circulation: Mild atherosclerotic disease in the cavernous carotid bilaterally without significant stenosis. Right middle cerebral artery widely patent. Hypoplastic left A1 segment. Both anterior cerebral arteries are patent from the right. Left M1 segment widely patent. No large vessel occlusion. There is an area of hypoperfusion in the left parietal lobe where there is an area of infarct on CT. This could be acute. Posterior circulation: Both vertebral arteries patent to the basilar. PICA patent bilaterally. Basilar widely patent. Right AICA patent. Superior cerebellar and posterior cerebral arteries patent bilaterally without stenosis or large vessel occlusion. Venous sinuses: Normal venous enhancement. Anatomic variants: None Review of the MIP images confirms the above findings IMPRESSION: 1. 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 String sinus present with plaque irregularity. Possible source of emboli. 2. Area of hypoperfusion left parietal lobe on CTA corresponding to an area of hypodensity on CT. Possible distal arterial occlusion left MCA territory. I am not able to identify definite acute vessel occlusion. 3. Prominent pannus posterior to the dens with subluxation of C1 on C2 compatible with arthropathy and instability. Possible CPPD or rheumatoid arthritis. 4. These results were called by telephone at the time of interpretation on  10/01/2019 at 11:18 am to provider [facility] , who verbally acknowledged these results. Electronically Signed   By: Marlan Palau M.D.   On: 10/01/2019 11:19   CT ANGIO NECK CODE STROKE  Result Date: 10/01/2019 CLINICAL DATA:  Acute neuro deficit. Right facial droop and right-sided weakness. Slurred speech. EXAM: CT ANGIOGRAPHY HEAD AND NECK TECHNIQUE: Multidetector CT imaging of the head and neck was performed using the standard protocol during bolus administration of intravenous contrast. Multiplanar CT image reconstructions and MIPs were obtained to evaluate the vascular anatomy. Carotid stenosis measurements (when applicable) are obtained utilizing NASCET criteria, using the distal internal carotid diameter as the denominator. CONTRAST:  OMNIPAQUE IOHEXOL 350 MG/ML SOLN COMPARISON:  CT head 10/01/2019 FINDINGS: CTA NECK FINDINGS Aortic arch: Atherosclerotic aortic arch. Proximal great vessels widely patent. 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 Right carotid system: Mild atherosclerotic disease right carotid bifurcation without significant stenosis. Left carotid system: High-grade stenosis proximal left internal carotid artery with irregular lumen. This may be soft plaque or plaque rupture. This could be a source of emboli. Moderate stenosis proximal left external carotid artery. Vertebral arteries: Both vertebral arteries widely patent to the basilar without stenosis. Skeleton:  Cervical spondylosis.  No acute skeletal abnormality. Other neck: Prominent pannus posterior to the dens compatible with arthropathy. This is causing mild flattening of the ventral cord and mild spinal stenosis. Rule out rheumatoid arthritis or CPPD. No bony erosion identified. C1 is subluxed to the right which is likely due to ligament laxity. No fracture identified. Upper chest: Apical scarring bilaterally. No acute abnormality in the lung apices. Review of the MIP images confirms the above  findings CTA HEAD FINDINGS Anterior circulation: Mild atherosclerotic disease in the cavernous carotid bilaterally without significant stenosis. Right middle cerebral artery widely patent. Hypoplastic left A1 segment. Both anterior cerebral arteries are patent from the right. Left M1 segment widely patent. No large vessel occlusion. There is an area of hypoperfusion in the left parietal lobe where there is an area of infarct on CT. This could be acute. Posterior circulation: Both vertebral arteries patent to the basilar. PICA patent bilaterally. Basilar widely patent. Right AICA patent. Superior cerebellar and posterior cerebral arteries patent bilaterally without stenosis or large vessel occlusion. Venous sinuses: Normal venous enhancement. Anatomic variants: None Review of the MIP images confirms the above findings IMPRESSION: 1. 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 String sinus present with plaque irregularity. Possible source of emboli. 2. Area of hypoperfusion left parietal lobe on CTA corresponding to an area of hypodensity on CT. Possible distal arterial occlusion left MCA territory. I am not able to identify definite acute vessel occlusion. 3. Prominent pannus posterior to the dens with subluxation of C1 on C2 compatible with arthropathy and instability. Possible CPPD or rheumatoid arthritis. 4. These results were called by telephone at the time of interpretation on 10/01/2019 at 11:18 am to provider [facility] , who verbally acknowledged these results. Electronically Signed   By: Marlan Palau M.D.   On: 10/01/2019 11:19    Procedures Procedures (including critical care time)  Medications Ordered in ED Medications  sodium chloride flush (NS) 0.9 % injection 3 mL (has no administration in time range)  iohexol (OMNIPAQUE) 350 MG/ML injection 100 mL (100 mLs Intravenous Contrast Given 10/01/19 1055)    ED Course  I have reviewed the triage vital signs and  the nursing notes.  Pertinent labs & imaging results that were available during my care of the patient were reviewed by me and considered in my medical decision making (see chart for details).    MDM Rules/Calculators/A&P                         Iv ns. Continuous pulse ox and cardiac monitoring. Stat labs and imaging. Pt was code stroke activation and taken immediately to CT.   MDM Number of Diagnoses or Management Options   Amount and/or Complexity of Data Reviewed Clinical lab tests: ordered and reviewed Tests in the radiology section of CPT: ordered and reviewed Tests in the medicine section of CPT: ordered and reviewed Discussion of test results with the performing providers: yes Decide to obtain previous medical records or to obtain history from someone other than the patient: yes Obtain history from someone other than the patient: yes Review and summarize past medical records: yes Discuss the patient with other providers: yes Independent visualization of images, tracings, or specimens: yes  Risk of Complications, Morbidity, and/or Mortality Presenting problems: high Diagnostic procedures: high Management options: high   Reviewed nursing notes and prior charts for additional history.   CT reviewed/interpreted by me - left parietal cva.  Labs reviewed/interpreted by me - Na, K normal. Cr sl elevated. WBC normal.  Recheck pt, no change in neuro exam from initial.  Neurology consulted.  Given time of onset symptoms, LVO neg, mild deficit - TPA not indicated. They indicate they will follow and consult, and request medicine admission.  Unassigned medicine consulted for admission.     Final Clinical Impression(s) / ED Diagnoses Final diagnoses:  None    Rx / DC Orders ED Discharge Orders    None           Cathren Laine, MD 10/01/19 1242

## 2019-10-02 ENCOUNTER — Encounter (HOSPITAL_COMMUNITY): Payer: Self-pay | Admitting: Internal Medicine

## 2019-10-02 ENCOUNTER — Inpatient Hospital Stay (HOSPITAL_COMMUNITY): Payer: Medicare Other

## 2019-10-02 DIAGNOSIS — F32A Depression, unspecified: Secondary | ICD-10-CM | POA: Diagnosis present

## 2019-10-02 DIAGNOSIS — R4789 Other speech disturbances: Secondary | ICD-10-CM

## 2019-10-02 DIAGNOSIS — F419 Anxiety disorder, unspecified: Secondary | ICD-10-CM

## 2019-10-02 DIAGNOSIS — R1310 Dysphagia, unspecified: Secondary | ICD-10-CM

## 2019-10-02 DIAGNOSIS — R000 Tachycardia, unspecified: Secondary | ICD-10-CM

## 2019-10-02 DIAGNOSIS — I10 Essential (primary) hypertension: Secondary | ICD-10-CM

## 2019-10-02 DIAGNOSIS — E785 Hyperlipidemia, unspecified: Secondary | ICD-10-CM

## 2019-10-02 DIAGNOSIS — F329 Major depressive disorder, single episode, unspecified: Secondary | ICD-10-CM

## 2019-10-02 DIAGNOSIS — F411 Generalized anxiety disorder: Secondary | ICD-10-CM | POA: Diagnosis present

## 2019-10-02 DIAGNOSIS — N189 Chronic kidney disease, unspecified: Secondary | ICD-10-CM | POA: Diagnosis present

## 2019-10-02 DIAGNOSIS — I6389 Other cerebral infarction: Secondary | ICD-10-CM

## 2019-10-02 DIAGNOSIS — R531 Weakness: Secondary | ICD-10-CM

## 2019-10-02 DIAGNOSIS — R7303 Prediabetes: Secondary | ICD-10-CM | POA: Diagnosis present

## 2019-10-02 DIAGNOSIS — I639 Cerebral infarction, unspecified: Secondary | ICD-10-CM

## 2019-10-02 LAB — CBC
HCT: 33.7 % — ABNORMAL LOW (ref 36.0–46.0)
Hemoglobin: 10.7 g/dL — ABNORMAL LOW (ref 12.0–15.0)
MCH: 29.6 pg (ref 26.0–34.0)
MCHC: 31.8 g/dL (ref 30.0–36.0)
MCV: 93.1 fL (ref 80.0–100.0)
Platelets: 345 10*3/uL (ref 150–400)
RBC: 3.62 MIL/uL — ABNORMAL LOW (ref 3.87–5.11)
RDW: 13.6 % (ref 11.5–15.5)
WBC: 6.3 10*3/uL (ref 4.0–10.5)
nRBC: 0 % (ref 0.0–0.2)

## 2019-10-02 LAB — LIPID PANEL
Cholesterol: 214 mg/dL — ABNORMAL HIGH (ref 0–200)
HDL: 44 mg/dL (ref >40)
LDL Cholesterol: 155 mg/dL — ABNORMAL HIGH (ref 0–99)
Total CHOL/HDL Ratio: 4.9 RATIO
Triglycerides: 75 mg/dL (ref <150)
VLDL: 15 mg/dL (ref 0–40)

## 2019-10-02 LAB — COMPREHENSIVE METABOLIC PANEL
ALT: 15 U/L (ref 0–44)
AST: 22 U/L (ref 15–41)
Albumin: 3.4 g/dL — ABNORMAL LOW (ref 3.5–5.0)
Alkaline Phosphatase: 69 U/L (ref 38–126)
Anion gap: 12 (ref 5–15)
BUN: 22 mg/dL (ref 8–23)
CO2: 21 mmol/L — ABNORMAL LOW (ref 22–32)
Calcium: 9.5 mg/dL (ref 8.9–10.3)
Chloride: 101 mmol/L (ref 98–111)
Creatinine, Ser: 1.33 mg/dL — ABNORMAL HIGH (ref 0.44–1.00)
GFR calc Af Amer: 41 mL/min — ABNORMAL LOW (ref >60)
GFR calc non Af Amer: 36 mL/min — ABNORMAL LOW (ref >60)
Glucose, Bld: 82 mg/dL (ref 70–99)
Potassium: 4.5 mmol/L (ref 3.5–5.1)
Sodium: 134 mmol/L — ABNORMAL LOW (ref 135–145)
Total Bilirubin: 1 mg/dL (ref 0.3–1.2)
Total Protein: 7 g/dL (ref 6.5–8.1)

## 2019-10-02 LAB — ECHOCARDIOGRAM COMPLETE
Area-P 1/2: 5.84 cm2
Calc EF: 71.3 %
Height: 62 in
S' Lateral: 1.5 cm
Single Plane A2C EF: 70.6 %
Single Plane A4C EF: 70.5 %
Weight: 1472 oz

## 2019-10-02 MED ORDER — LACTATED RINGERS IV SOLN: INTRAVENOUS | Status: AC

## 2019-10-02 MED ORDER — DIAZEPAM 2 MG PO TABS: 2.0000 mg | ORAL_TABLET | Freq: Two times a day (BID) | ORAL | Status: DC | PRN

## 2019-10-02 NOTE — Consult Note (Signed)
Physical Medicine and Rehabilitation Consult   Reason for Consult: Stroke with functional deficits.  Referring Physician: Dr. Sandre Kitty.   HPI: Elizabeth Tapia is a 86 y.o. RH-female with history of HTN, CKD, anxiety/depression, tremors who was admitted on 09/11/2019 with dysarthria and right hemiparesis.  History taken from chart review, son, and patient due to cognitive slowing. patient also reported having had malaise with decrease in appetite times few days.  CTA head/neck showed critical stenosis left ICA greater than 90% with string sign, hypoperfusion left parietal lobe with possible distal arterial occlusion left MCA and prominent pannus with subluxation of C1 on C2 compatible with arthropathy and instability--?  CPPD versus RA.  MRI brain showed multiple foci of acute infarcts within left frontal and parietal lobe in watershed distribution.  She was started on heparin ggt and Dr. Durwin Nora consulted for input. He recommended left carotid endarterectomy--carotid Dopplers pending.  Patient currently with reluctant to consider surgery.  Echocardiogram with ejection fraction of 70-75%, no wall abnormality and moderately elevated pulmonary arterial pressures.  PT evaluations revealed balance deficits with difficulty standing and CIR recommended to functional decline  Grandson (disabled veteran) has been living with patient for the past year--is there most of the time. Family stopped her driving early this year. Family provides meals and manages home. Plans for ILF or moving in with son after discharge.   Review of Systems  Constitutional: Negative for chills and fever.  HENT: Negative for hearing loss.   Eyes: Negative for blurred vision and double vision.  Respiratory: Negative for cough and shortness of breath.   Cardiovascular: Negative for chest pain and palpitations.  Gastrointestinal: Negative for constipation, heartburn and nausea.  Genitourinary: Negative for dysuria and urgency.    Musculoskeletal: Negative for back pain, falls, joint pain, myalgias and neck pain.  Skin: Negative for rash.  Neurological: Positive for dizziness (with therapy today), sensory change (right hand), speech change, focal weakness and weakness. Negative for headaches.  Psychiatric/Behavioral: Negative for memory loss.  All other systems reviewed and are negative.  Past Medical History:  Diagnosis Date  . Anxiety   . CKD (chronic kidney disease)   . Depression   . Essential tremor   . HTN (hypertension)   . IBS (irritable bowel syndrome)    with diarrhea     Past Surgical History:  Procedure Laterality Date  . EYE SURGERY       Family History  Problem Relation Age of Onset  . Alcohol abuse Father   . Alcohol abuse Sister   . Alcohol abuse Brother      Social History: Lives alone and independent without AD. She does not use tobacco, alcohol or illicit drugs.    Allergies: No Known Allergies    Medications Prior to Admission  Medication Sig Dispense Refill  . acetaminophen (TYLENOL) 500 MG tablet Take 500 mg by mouth every 6 (six) hours as needed for mild pain.    . cholestyramine (QUESTRAN) 4 g packet Take 1 packet by mouth 3 (three) times daily.    . diazepam (VALIUM) 2 MG tablet Take 2 mg by mouth every 12 (twelve) hours as needed for anxiety.    Marland Kitchen FLUoxetine (PROZAC) 10 MG capsule Take 10 mg by mouth daily.    . propranolol (INDERAL) 60 MG tablet Take 60 mg by mouth 2 (two) times daily.      Home: Home Living Family/patient expects to be discharged to:: Private residence Living Arrangements: Alone Available Help at Discharge:  Family, Available PRN/intermittently (grandson stays most nights; son (retired) checks in most day) Type of Home: House Home Access: Stairs to enter Secretary/administrator of Steps: 1 Entrance Stairs-Rails: Right Home Layout: One level Bathroom Shower/Tub: Walk-in shower Home Equipment: Cane - single point, Environmental consultant - 2 wheels, Shower seat -  built in Additional Comments: pt reports equipment was her mothers (?reliability)  Functional History: Prior Function Level of Independence: Needs assistance Gait / Transfers Assistance Needed: in past year fell due to slip on kitchen floor; otherwise independent ADL's / Homemaking Assistance Needed: does not drive; family does grocery shopping and cleaning house Functional Status:  Mobility: Bed Mobility Overal bed mobility: Needs Assistance Bed Mobility: Supine to Sit, Sit to Supine, Rolling Rolling: Supervision (to rt) Supine to sit: Mod assist Sit to supine: Min assist General bed mobility comments: HOB flat, no rail; pt able to scoot laterally in supine to her left, unable to scoot to her right (repeatedly rolls rt instead); able to advance legs off left EOB with incr time for RLE, pulls to sitting, however with posterior LOB wwhen attempting to seated scoot to EOB and get feet to the floor required mod assist  Transfers Overall transfer level: Needs assistance Equipment used:  (bed rail on her left) Transfers: Sit to/from Stand Sit to Stand: Mod assist General transfer comment: RLE tended to abduct and required assist for balance and to adduct RLE into weightbearing position to allow side step with LLE Ambulation/Gait General Gait Details: side step x 2 to pt's left only due to elevated HR and will need +2 assist for lines    ADL:    Cognition: Cognition Overall Cognitive Status: No family/caregiver present to determine baseline cognitive functioning Orientation Level: Oriented X4 Cognition Arousal/Alertness: Awake/alert Behavior During Therapy: WFL for tasks assessed/performed Overall Cognitive Status: No family/caregiver present to determine baseline cognitive functioning Area of Impairment: Safety/judgement, Awareness Safety/Judgement: Decreased awareness of safety, Decreased awareness of deficits Awareness: Intellectual General Comments: Patient able to state her rt  side is weak, however also states she can go home and work on getting better--doesn't want to stay in the hospital  Blood pressure 124/76, pulse (!) 103, temperature 98.2 F (36.8 C), temperature source Oral, resp. rate 20, height 5\' 2"  (1.575 m), weight 41.7 kg, SpO2 98 %. Physical Exam Vitals and nursing note reviewed.  Constitutional:      Appearance: Normal appearance. She is underweight.     Comments: Frail  HENT:     Head: Normocephalic and atraumatic.     Right Ear: External ear normal.     Left Ear: External ear normal.     Nose: Nose normal.  Eyes:     General:        Right eye: No discharge.        Left eye: No discharge.     Extraocular Movements: Extraocular movements intact.  Cardiovascular:     Rate and Rhythm: Regular rhythm. Tachycardia present.  Pulmonary:     Effort: Pulmonary effort is normal. No respiratory distress.     Breath sounds: Normal breath sounds. No stridor.  Abdominal:     General: Abdomen is flat. Bowel sounds are normal. There is no distension.  Musculoskeletal:     Cervical back: Normal range of motion.     Comments: No edema or tenderness in extremities  Skin:    General: Skin is warm and dry.     Coloration: Skin is not jaundiced.  Neurological:     Mental Status:  She is alert and oriented to person, place, and time.     Comments: Mild right facial droop with mildly ataxic speech. HOH Intentional tremors noted.  Motor: LUE/LLE: 4/5 proximal distal RUE: Shoulder abduction 2/5, elbow flexion/extension 2+/5, distally 0/5 Right lower extremity: Hip flexion, knee extension 3/5, ankle dorsiflexion 4+/5  Psychiatric:        Mood and Affect: Affect is blunt and flat.        Speech: Speech is delayed.     Results for orders placed or performed during the hospital encounter of 10/01/19 (from the past 24 hour(s))  Respiratory Panel by RT PCR (Flu A&B, Covid) - Nasopharyngeal Swab     Status: None   Collection Time: 10/01/19 12:40 PM    Specimen: Nasopharyngeal Swab  Result Value Ref Range   SARS Coronavirus 2 by RT PCR NEGATIVE NEGATIVE   Influenza A by PCR NEGATIVE NEGATIVE   Influenza B by PCR NEGATIVE NEGATIVE  CBC     Status: Abnormal   Collection Time: 10/02/19  6:25 AM  Result Value Ref Range   WBC 6.3 4.0 - 10.5 K/uL   RBC 3.62 (L) 3.87 - 5.11 MIL/uL   Hemoglobin 10.7 (L) 12.0 - 15.0 g/dL   HCT 16.1 (L) 36 - 46 %   MCV 93.1 80.0 - 100.0 fL   MCH 29.6 26.0 - 34.0 pg   MCHC 31.8 30.0 - 36.0 g/dL   RDW 09.6 04.5 - 40.9 %   Platelets 345 150 - 400 K/uL   nRBC 0.0 0.0 - 0.2 %  Comprehensive metabolic panel     Status: Abnormal   Collection Time: 10/02/19  6:25 AM  Result Value Ref Range   Sodium 134 (L) 135 - 145 mmol/L   Potassium 4.5 3.5 - 5.1 mmol/L   Chloride 101 98 - 111 mmol/L   CO2 21 (L) 22 - 32 mmol/L   Glucose, Bld 82 70 - 99 mg/dL   BUN 22 8 - 23 mg/dL   Creatinine, Ser 8.11 (H) 0.44 - 1.00 mg/dL   Calcium 9.5 8.9 - 91.4 mg/dL   Total Protein 7.0 6.5 - 8.1 g/dL   Albumin 3.4 (L) 3.5 - 5.0 g/dL   AST 22 15 - 41 U/L   ALT 15 0 - 44 U/L   Alkaline Phosphatase 69 38 - 126 U/L   Total Bilirubin 1.0 0.3 - 1.2 mg/dL   GFR calc non Af Amer 36 (L) >60 mL/min   GFR calc Af Amer 41 (L) >60 mL/min   Anion gap 12 5 - 15  Lipid panel     Status: Abnormal   Collection Time: 10/02/19  6:25 AM  Result Value Ref Range   Cholesterol 214 (H) 0 - 200 mg/dL   Triglycerides 75 <782 mg/dL   HDL 44 >95 mg/dL   Total CHOL/HDL Ratio 4.9 RATIO   VLDL 15 0 - 40 mg/dL   LDL Cholesterol 621 (H) 0 - 99 mg/dL   MR BRAIN WO CONTRAST  Result Date: 10/01/2019 CLINICAL DATA:  Neuro deficit, acute stroke suspected. EXAM: MRI HEAD WITHOUT CONTRAST TECHNIQUE: Only DWI and ADC imaging was performed given patient intolerance. COMPARISON:  Same day code stroke. FINDINGS: Only DWI and ADC imaging was performed given patient intolerance. This demonstrates multiple foci of acute infarcts within the left frontal and parietal lobe  cortex and subcortical white matter. The most confluent area of acute infarct is in the left parietal lobe (distal left MCA territory) correlates with the  hypodensity seen on CT. No evidence of hydrocephalus. IMPRESSION: Only DWI and ADC imaging was performed given patient intolerance. This demonstrates multiple foci of acute infarcts within the left frontal and parietal lobes in a watershed distribution. The most confluent area of acute infarct is in the left parietal lobe (distal left MCA territory) and correlates with the hypodensity seen on CT. Electronically Signed   By: Feliberto Harts MD   On: 10/01/2019 17:05   CT CEREBRAL PERFUSION W CONTRAST  Result Date: 10/01/2019 CLINICAL DATA:  Acute neuro deficit. Right facial droop and right-sided weakness. Slurred speech. EXAM: CT ANGIOGRAPHY HEAD AND NECK TECHNIQUE: Multidetector CT imaging of the head and neck was performed using the standard protocol during bolus administration of intravenous contrast. Multiplanar CT image reconstructions and MIPs were obtained to evaluate the vascular anatomy. Carotid stenosis measurements (when applicable) are obtained utilizing NASCET criteria, using the distal internal carotid diameter as the denominator. CONTRAST:  OMNIPAQUE IOHEXOL 350 MG/ML SOLN COMPARISON:  CT head 10/01/2019 FINDINGS: CTA NECK FINDINGS Aortic arch: Atherosclerotic aortic arch. Proximal great vessels widely patent. 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 Right carotid system: Mild atherosclerotic disease right carotid bifurcation without significant stenosis. Left carotid system: High-grade stenosis proximal left internal carotid artery with irregular lumen. This may be soft plaque or plaque rupture. This could be a source of emboli. Moderate stenosis proximal left external carotid artery. Vertebral arteries: Both vertebral arteries widely patent to the basilar without stenosis. Skeleton: Cervical spondylosis.  No acute  skeletal abnormality. Other neck: Prominent pannus posterior to the dens compatible with arthropathy. This is causing mild flattening of the ventral cord and mild spinal stenosis. Rule out rheumatoid arthritis or CPPD. No bony erosion identified. C1 is subluxed to the right which is likely due to ligament laxity. No fracture identified. Upper chest: Apical scarring bilaterally. No acute abnormality in the lung apices. Review of the MIP images confirms the above findings CTA HEAD FINDINGS Anterior circulation: Mild atherosclerotic disease in the cavernous carotid bilaterally without significant stenosis. Right middle cerebral artery widely patent. Hypoplastic left A1 segment. Both anterior cerebral arteries are patent from the right. Left M1 segment widely patent. No large vessel occlusion. There is an area of hypoperfusion in the left parietal lobe where there is an area of infarct on CT. This could be acute. Posterior circulation: Both vertebral arteries patent to the basilar. PICA patent bilaterally. Basilar widely patent. Right AICA patent. Superior cerebellar and posterior cerebral arteries patent bilaterally without stenosis or large vessel occlusion. Venous sinuses: Normal venous enhancement. Anatomic variants: None Review of the MIP images confirms the above findings IMPRESSION: 1. 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 String sinus present with plaque irregularity. Possible source of emboli. 2. Area of hypoperfusion left parietal lobe on CTA corresponding to an area of hypodensity on CT. Possible distal arterial occlusion left MCA territory. I am not able to identify definite acute vessel occlusion. 3. Prominent pannus posterior to the dens with subluxation of C1 on C2 compatible with arthropathy and instability. Possible CPPD or rheumatoid arthritis. 4. These results were called by telephone at the time of interpretation on 10/01/2019 at 11:18 am to provider [facility]  , who verbally acknowledged these results. Electronically Signed   By: Marlan Palau M.D.   On: 10/01/2019 11:19   ECHOCARDIOGRAM COMPLETE  Result Date: 10/02/2019    ECHOCARDIOGRAM REPORT   Patient Name:   Elizabeth Tapia Date of Exam: 10/02/2019 Medical Rec #:  [id number]      Height:       62.0 in Accession #:    [id number]     Weight:       92.0 lb Date of Birth:  [birth year]      BSA:          1.374 m Patient Age:    89 years       BP:           144/72 mmHg Patient Gender: F              HR:           113 bpm. Exam Location:  Inpatient Procedure: 2D Echo, 3D Echo, Cardiac Doppler and Color Doppler Indications:     Stroke  History:         Patient has no prior history of Echocardiogram examinations.                  Abnormal ECG; Risk Factors:Hypertension and Dyslipidemia. No                  prior cardiac history. Kidney disorder.  Sonographer:     Sheralyn Boatman RDCS Referring Phys:  [id number] Cathren Laine Diagnosing Phys: Donato Schultz MD  Sonographer Comments: Suboptimal parasternal window. IMPRESSIONS  1. Left ventricular ejection fraction, by estimation, is 70 to 75%. Left ventricular ejection fraction by 3D volume is 77 %. The left ventricle has hyperdynamic function. The left ventricle has no regional wall motion abnormalities. There is moderate asymmetric left ventricular hypertrophy of the basal-septal segment. Left ventricular diastolic parameters are consistent with Grade I diastolic dysfunction (impaired relaxation).  2. Right ventricular systolic function is normal. The right ventricular size is normal. There is moderately elevated pulmonary artery systolic pressure. The estimated right ventricular systolic pressure is 49.2 mmHg.  3. The mitral valve is normal in structure. No evidence of mitral valve regurgitation. No evidence of mitral stenosis.  4. The aortic valve is normal in structure. Aortic valve regurgitation is trivial. No aortic stenosis is present.  5. There is mild (Grade II) plaque.  6. The  inferior vena cava is normal in size with greater than 50% respiratory variability, suggesting right atrial pressure of 3 mmHg. Conclusion(s)/Recommendation(s): No intracardiac source of embolism detected on this transthoracic study. A transesophageal echocardiogram is recommended to exclude cardiac source of embolism if clinically indicated. FINDINGS  Left Ventricle: Left ventricular ejection fraction, by estimation, is 70 to 75%. Left ventricular ejection fraction by 3D volume is 77 %. The left ventricle has hyperdynamic function. The left ventricle has no regional wall motion abnormalities. The left ventricular internal cavity size was normal in size. There is moderate asymmetric left ventricular hypertrophy of the basal-septal segment. Left ventricular diastolic parameters are consistent with Grade I diastolic dysfunction (impaired relaxation). Right Ventricle: The right ventricular size is normal. No increase in right ventricular wall thickness. Right ventricular systolic function is normal. There is moderately elevated pulmonary artery systolic pressure. The tricuspid regurgitant velocity is 3.40 m/s, and with an assumed right atrial pressure of 3 mmHg, the estimated right ventricular systolic pressure is 49.2 mmHg. Left Atrium: Left atrial size was normal in size. Right Atrium: Right atrial size was normal in size. Pericardium: There is no evidence of pericardial effusion. Mitral Valve: The mitral valve is normal in structure. No evidence of mitral valve regurgitation. No evidence of mitral valve stenosis. MV peak gradient, 10.2 mmHg. The mean mitral valve gradient is 2.0 mmHg. Tricuspid Valve:  The tricuspid valve is normal in structure. Tricuspid valve regurgitation is mild . No evidence of tricuspid stenosis. Aortic Valve: The aortic valve is normal in structure. Aortic valve regurgitation is trivial. No aortic stenosis is present. Pulmonic Valve: The pulmonic valve was normal in structure. Pulmonic valve  regurgitation is not visualized. No evidence of pulmonic stenosis. Aorta: The aortic root is normal in size and structure. There is mild (Grade II) plaque. Venous: The inferior vena cava is normal in size with greater than 50% respiratory variability, suggesting right atrial pressure of 3 mmHg. IAS/Shunts: No atrial level shunt detected by color flow Doppler.  LEFT VENTRICLE PLAX 2D LVIDd:         2.20 cm         Diastology LVIDs:         1.50 cm         LV e' medial:    6.20 cm/s LV PW:         0.80 cm         LV E/e' medial:  13.4 LV IVS:        1.50 cm         LV e' lateral:   13.95 cm/s LVOT diam:     2.00 cm         LV E/e' lateral: 6.0 LV SV:         54 LV SV Index:   40 LVOT Area:     3.14 cm        3D Volume EF                                LV 3D EF:    Left                                             ventricular LV Volumes (MOD)                            ejection LV vol d, MOD    43.6 ml                    fraction by A2C:                                        3D volume LV vol d, MOD    39.0 ml                    is 77 %. A4C: LV vol s, MOD    12.8 ml A2C:                           3D Volume EF: LV vol s, MOD    11.5 ml       3D EF:        77 % A4C: LV SV MOD A2C:   30.8 ml LV SV MOD A4C:   39.0 ml LV SV MOD BP:    30.3 ml RIGHT VENTRICLE             IVC RV S prime:     18.00 cm/s  IVC diam:  1.00 cm TAPSE (M-mode): 2.2 cm LEFT ATRIUM           Index       RIGHT ATRIUM          Index LA diam:      2.80 cm 2.04 cm/m  RA Area:     5.90 cm LA Vol (A2C): 23.6 ml 17.17 ml/m RA Volume:   10.30 ml 7.49 ml/m LA Vol (A4C): 11.2 ml 8.15 ml/m  AORTIC VALVE LVOT Vmax:   106.00 cm/s LVOT Vmean:  62.500 cm/s LVOT VTI:    0.173 m  AORTA Ao Root diam: 3.00 cm MITRAL VALVE                TRICUSPID VALVE MV Area (PHT): 5.84 cm     TR Peak grad:   46.2 mmHg MV Peak grad:  10.2 mmHg    TR Vmax:        340.00 cm/s MV Mean grad:  2.0 mmHg MV Vmax:       1.60 m/s     SHUNTS MV Vmean:      63.9 cm/s    Systemic VTI:  0.17  m MV Decel Time: 130 msec     Systemic Diam: 2.00 cm MV E velocity: 83.20 cm/s MV A velocity: 148.00 cm/s MV E/A ratio:  0.56 Donato Schultz MD Electronically signed by Donato Schultz MD Signature Date/Time: 10/02/2019/11:05:51 AM    Final (Updated)    CT HEAD CODE STROKE WO CONTRAST  Result Date: 10/01/2019 CLINICAL DATA:  Code stroke. Acute neuro deficit. Right facial droop and right-sided weakness. Slurred speech. EXAM: CT HEAD WITHOUT CONTRAST TECHNIQUE: Contiguous axial images were obtained from the base of the skull through the vertex without intravenous contrast. COMPARISON:  None. FINDINGS: Brain: Generalized atrophy. Extensive white matter hypodensity bilaterally. Hypodensity in the left parietal compatible with infarct possibly acute or subacute. This is approximately 2 cm in size. Small area of increased density within the hypodensity could represent acute hemorrhage. Vascular: Negative for hyperdense vessel Skull: Negative Sinuses/Orbits: Chronic bony thickening of the sphenoid sinus with associated air-fluid level. Remaining sinuses clear. Bilateral cataract extraction. Other: None ASPECTS ([place]berta Stroke Program Early CT Score) - Ganglionic level infarction (caudate, lentiform nuclei, internal capsule, insula, M1-M3 cortex): 7 - Supraganglionic infarction (M4-M6 cortex): 2 Total score (0-10 with 10 being normal): 9 IMPRESSION: 1. Hypodensity left parietal cortex compatible with infarction which could be acute or subacute. Small associated hyperdensity could represent hemorrhage. No prior studies for comparison 2. Generalized atrophy with extensive chronic microvascular ischemic change in the white matter 3. ASPECTS is 9 4. Code stroke imaging results were communicated on 10/01/2019 at 10:47 am to provider Thomasena Edis via Loretha Stapler Electronically Signed   By: Marlan Palau M.D.   On: 10/01/2019 10:51   CT ANGIO HEAD CODE STROKE  Result Date: 10/01/2019 CLINICAL DATA:  Acute neuro deficit. Right facial droop  and right-sided weakness. Slurred speech. EXAM: CT ANGIOGRAPHY HEAD AND NECK TECHNIQUE: Multidetector CT imaging of the head and neck was performed using the standard protocol during bolus administration of intravenous contrast. Multiplanar CT image reconstructions and MIPs were obtained to evaluate the vascular anatomy. Carotid stenosis measurements (when applicable) are obtained utilizing NASCET criteria, using the distal internal carotid diameter as the denominator. CONTRAST:  OMNIPAQUE IOHEXOL 350 MG/ML SOLN COMPARISON:  CT head 10/01/2019 FINDINGS: CTA NECK FINDINGS Aortic arch: Atherosclerotic aortic arch. Proximal great vessels widely patent. 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 Right carotid system:  Mild atherosclerotic disease right carotid bifurcation without significant stenosis. Left carotid system: High-grade stenosis proximal left internal carotid artery with irregular lumen. This may be soft plaque or plaque rupture. This could be a source of emboli. Moderate stenosis proximal left external carotid artery. Vertebral arteries: Both vertebral arteries widely patent to the basilar without stenosis. Skeleton: Cervical spondylosis.  No acute skeletal abnormality. Other neck: Prominent pannus posterior to the dens compatible with arthropathy. This is causing mild flattening of the ventral cord and mild spinal stenosis. Rule out rheumatoid arthritis or CPPD. No bony erosion identified. C1 is subluxed to the right which is likely due to ligament laxity. No fracture identified. Upper chest: Apical scarring bilaterally. No acute abnormality in the lung apices. Review of the MIP images confirms the above findings CTA HEAD FINDINGS Anterior circulation: Mild atherosclerotic disease in the cavernous carotid bilaterally without significant stenosis. Right middle cerebral artery widely patent. Hypoplastic left A1 segment. Both anterior cerebral arteries are patent from the right. Left  M1 segment widely patent. No large vessel occlusion. There is an area of hypoperfusion in the left parietal lobe where there is an area of infarct on CT. This could be acute. Posterior circulation: Both vertebral arteries patent to the basilar. PICA patent bilaterally. Basilar widely patent. Right AICA patent. Superior cerebellar and posterior cerebral arteries patent bilaterally without stenosis or large vessel occlusion. Venous sinuses: Normal venous enhancement. Anatomic variants: None Review of the MIP images confirms the above findings IMPRESSION: 1. 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 String sinus present with plaque irregularity. Possible source of emboli. 2. Area of hypoperfusion left parietal lobe on CTA corresponding to an area of hypodensity on CT. Possible distal arterial occlusion left MCA territory. I am not able to identify definite acute vessel occlusion. 3. Prominent pannus posterior to the dens with subluxation of C1 on C2 compatible with arthropathy and instability. Possible CPPD or rheumatoid arthritis. 4. These results were called by telephone at the time of interpretation on 10/01/2019 at 11:18 am to provider [facility] , who verbally acknowledged these results. Electronically Signed   By: Marlan Palau M.D.   On: 10/01/2019 11:19   CT ANGIO NECK CODE STROKE  Result Date: 10/01/2019 CLINICAL DATA:  Acute neuro deficit. Right facial droop and right-sided weakness. Slurred speech. EXAM: CT ANGIOGRAPHY HEAD AND NECK TECHNIQUE: Multidetector CT imaging of the head and neck was performed using the standard protocol during bolus administration of intravenous contrast. Multiplanar CT image reconstructions and MIPs were obtained to evaluate the vascular anatomy. Carotid stenosis measurements (when applicable) are obtained utilizing NASCET criteria, using the distal internal carotid diameter as the denominator. CONTRAST:  OMNIPAQUE IOHEXOL 350 MG/ML  SOLN COMPARISON:  CT head 10/01/2019 FINDINGS: CTA NECK FINDINGS Aortic arch: Atherosclerotic aortic arch. Proximal great vessels widely patent. 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 Right carotid system: Mild atherosclerotic disease right carotid bifurcation without significant stenosis. Left carotid system: High-grade stenosis proximal left internal carotid artery with irregular lumen. This may be soft plaque or plaque rupture. This could be a source of emboli. Moderate stenosis proximal left external carotid artery. Vertebral arteries: Both vertebral arteries widely patent to the basilar without stenosis. Skeleton: Cervical spondylosis.  No acute skeletal abnormality. Other neck: Prominent pannus posterior to the dens compatible with arthropathy. This is causing mild flattening of the ventral cord and mild spinal stenosis. Rule out rheumatoid arthritis or CPPD. No bony erosion identified. C1 is subluxed to the right  which is likely due to ligament laxity. No fracture identified. Upper chest: Apical scarring bilaterally. No acute abnormality in the lung apices. Review of the MIP images confirms the above findings CTA HEAD FINDINGS Anterior circulation: Mild atherosclerotic disease in the cavernous carotid bilaterally without significant stenosis. Right middle cerebral artery widely patent. Hypoplastic left A1 segment. Both anterior cerebral arteries are patent from the right. Left M1 segment widely patent. No large vessel occlusion. There is an area of hypoperfusion in the left parietal lobe where there is an area of infarct on CT. This could be acute. Posterior circulation: Both vertebral arteries patent to the basilar. PICA patent bilaterally. Basilar widely patent. Right AICA patent. Superior cerebellar and posterior cerebral arteries patent bilaterally without stenosis or large vessel occlusion. Venous sinuses: Normal venous enhancement. Anatomic variants: None Review of the MIP images  confirms the above findings IMPRESSION: 1. 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 String sinus present with plaque irregularity. Possible source of emboli. 2. Area of hypoperfusion left parietal lobe on CTA corresponding to an area of hypodensity on CT. Possible distal arterial occlusion left MCA territory. I am not able to identify definite acute vessel occlusion. 3. Prominent pannus posterior to the dens with subluxation of C1 on C2 compatible with arthropathy and instability. Possible CPPD or rheumatoid arthritis. 4. These results were called by telephone at the time of interpretation on 10/01/2019 at 11:18 am to provider [facility] , who verbally acknowledged these results. Electronically Signed   By: Marlan Palau M.D.   On: 10/01/2019 11:19    Assessment/Plan: Diagnosis: left frontal and parietal lobe in watershed distribution.   Stroke: Continue secondary stroke prophylaxis and Risk Factor Modification listed below:   Antiplatelet therapy:   Blood Pressure Management:  Continue current medication with prn's with permisive HTN per primary team Statin Agent:   Prediabetes management:   Right sided hemiparesis: fit for orthosis to prevent contractures (resting hand splint for day, wrist cock up splint at night, etc) PT/OT for mobility, ADL training  Motor recovery: Fluoxetine Labs independently reviewed.  Records reviewed and summated above.  1. Does the need for close, 24 hr/day medical supervision in concert with the patient's rehab needs make it unreasonable for this patient to be served in a less intensive setting? Yes  2. Co-Morbidities requiring supervision/potential complications: HTN (monitor and provide prns in accordance with increased physical exertion and pain), anxiety/depression (ensure anxiety and resulting apprehension do not limit functional progress; consider prn medications if warranted), tremors, CKD (avoid nephrotoxic meds, repeat  labs), prediabetes (Monitor in accordance with exercise and adjust meds as necessary) 3. Due to safety, disease management, medication administration and patient education, does the patient require 24 hr/day rehab nursing? Yes 4. Does the patient require coordinated care of a physician, rehab nurse, therapy disciplines of PT/OT/SLP to address physical and functional deficits in the context of the above medical diagnosis(es)? Yes Addressing deficits in the following areas: balance, endurance, locomotion, strength, transferring, bowel/bladder control, bathing, dressing, toileting, cognition and psychosocial support 5. Can the patient actively participate in an intensive therapy program of at least 3 hrs of therapy per day at least 5 days per week? Yes 6. The potential for patient to make measurable gains while on inpatient rehab is excellent 7. Anticipated functional outcomes upon discharge from inpatient rehab are supervision and min assist  with PT, supervision and min assist with OT, modified independent with SLP. 8. Estimated rehab length of stay to reach the above  functional goals is: 10-15 days. 9. Anticipated discharge destination: Home 10. Overall Rehab/Functional Prognosis: good  RECOMMENDATIONS: This patient's condition is appropriate for continued rehabilitative care in the following setting: CIR after completion of medical work-up. Patient has agreed to participate in recommended program. Yes Note that insurance prior authorization may be required for reimbursement for recommended care.  Comment: Rehab Admissions Coordinator to follow up.  I have personally performed a face to face diagnostic evaluation, including, but not limited to relevant history and physical exam findings, of this patient and developed relevant assessment and plan.  Additionally, I have reviewed and concur with the physician assistant's documentation above.   Maryla Morrow, MD, ABPMR Jacquelynn Cree, PA-C 10/02/2019

## 2019-10-02 NOTE — Evaluation (Signed)
Occupational Therapy Evaluation Patient Details Name: Elizabeth Tapia MRN: [id number] DOB: 12/03/1929 Today's Date: 10/02/2019    History of Present Illness 86 year old female with a history of hypertension, hyperlipidemia, CKD IIIa, ?rheumatoid arthritis, anxiety, and depression who presented 10/01/19 with slurred speech, R sided weakness, and unable to walk. MRI multiple foci of acute infarcts within the left frontal and parietal lobes in a watershed distribution. +L ICA stenosis and pt considering L CEA   Clinical Impression   Pt seen for OT evaluation this date, OX3 with what appears to be fair insight to deficits. Occasional word finding difficulty but otherwise cognition appears intact. Acutely pt presents with R hemi weakness, sensation intact, R shoulder and elbow presenting with tone, trace contraction to wrist in GE positioning. No deficits observed during visual assessment. pt requiring mod-max A for bed mobility and transfer this date and demos need for mod-max A for all ADL's limited by acute deficits.  pt endorsing being Indep with basic ADL's and mobility in home where she lives alone, with family  (son and grandson) checking in on her daily. Pt resistive to recommendation of post acute therapy, but anticipate she would significantly benefit from post acute therapy, otherwise would require extensive A at home. Improved tolerance for transfers and sitting with BP remaining WFL, and HR 99-110 with sitting and transfers. OT will continue to follow acutely with recs listed below.      Follow Up Recommendations  CIR;Supervision/Assistance - 24 hour    Equipment Recommendations  Other (comment) (will continue to assess with further particiaption)    Recommendations for Other Services Rehab consult     Precautions / Restrictions Precautions Precautions: Fall Restrictions Weight Bearing Restrictions: No      Mobility Bed Mobility Overal bed mobility: Needs Assistance Bed  Mobility: Supine to Sit;Rolling Rolling: Mod assist (to L side)   Supine to sit: Mod assist Sit to supine: Min assist   General bed mobility comments: HOB flat, cues for squencing to sit to EOB to L side, requiring increased A at trunk for transition to sitting EOB.   Transfers Overall transfer level: Needs assistance Equipment used:  (bed rail on her left) Transfers: Sit to/from Stand Sit to Stand: Mod assist         General transfer comment: A for weight shifting to L side static standing, difficulty with righting reactions    Balance Overall balance assessment: Needs assistance Sitting-balance support: Bilateral upper extremity supported   Postural control: Posterior lean Standing balance support: Single extremity supported     ADL either performed or assessed with clinical judgement   ADL Overall ADL's : Needs assistance/impaired     Grooming: Wash/dry face;Minimal assistance Upper Body Dressing : Moderate assistance;Cueing for sequencing;Cueing for compensatory techniques   Lower Body Dressing: Maximal assistance   Toilet Transfer: Maximal assistance   introduced concepts of adaptive dressing/modification of task completion while seated EOB briefly prior to nursing arrival. Provided pt with positioning of RUE while in chair for increased attention and decreased risk of injury.      Vision Baseline Vision/History: Wears glasses Wears Glasses: At all times (Glasses not on during eval) Vision Assessment?: No apparent visual deficits     Perception Perception Perception Tested?: Yes Comments: grossly appears intact    Praxis      Pertinent Vitals/Pain Pain Assessment: No/denies pain     Hand Dominance Right   Extremity/Trunk Assessment Upper Extremity Assessment Upper Extremity Assessment: RUE deficits/detail RUE:  (Noted tone to ROM  at elbow/shoulder. R ring finger trigger) RUE Sensation: WNL RUE Coordination: decreased fine motor;decreased gross  motor   Lower Extremity Assessment Lower Extremity Assessment: RLE deficits/detail RLE Deficits / Details: pt able to isolate movements all joints; dorsiflexion results in supination; hip flexion able to raise leg off bed, knee extension grossly 4/5, ankle DF 4/5 RLE Sensation: WNL (pt denies changes)   Cervical / Trunk Assessment Cervical / Trunk Assessment: Kyphotic   Communication Communication Communication: Expressive difficulties (intermittent difficulty with word finding, not consistent)   Cognition Arousal/Alertness: Awake/alert Behavior During Therapy: WFL for tasks assessed/performed Overall Cognitive Status: No family/caregiver present to determine baseline cognitive functioning Area of Impairment: Safety/judgement;Awareness;Problem solving Safety/Judgement: Decreased awareness of safety Awareness: Intellectual   General Comments: Patient able to state her rt side is weak, however also states she can go home and work on getting better--doesn't want to stay in the hospital   General Comments  bed 124/70 HR 99, transfer to chair 114/97 HR 126    Exercises     Shoulder Instructions      Home Living Family/patient expects to be discharged to:: Private residence Living Arrangements: Alone Available Help at Discharge: Family;Available PRN/intermittently (son/grandson check in daily throughout the day) Type of Home: House Home Access: Stairs to enter [employer] of Steps: 1 Entrance Stairs-Rails: Right Home Layout: One level     Bathroom Shower/Tub: Walk-in shower         Home Equipment: Gilmer Mor - single point;Walker - 2 wheels;Shower seat - built in   Additional Comments: pt reports equipment was her mothers (?reliability)      Prior Functioning/Environment Level of Independence: Needs assistance  Gait / Transfers Assistance Needed: in past year fell due to slip on kitchen floor; otherwise independent ADL's / Homemaking Assistance Needed: Assist from  family for appts, driving, grocery shopping and sometimes medications             OT Problem List: Decreased strength;Decreased range of motion;Decreased activity tolerance;Impaired balance (sitting and/or standing);Decreased coordination;Decreased knowledge of use of DME or AE;Decreased knowledge of precautions;Impaired UE functional use;Impaired tone      OT Treatment/Interventions:      OT Goals(Current goals can be found in the care plan section) Acute Rehab OT Goals Patient Stated Goal: to get up by myself OT Goal Formulation: With patient ADL Goals Pt Will Perform Grooming: with set-up;with supervision Pt Will Perform Upper Body Dressing: with min assist Pt Will Transfer to Toilet: with min assist  OT Frequency: Min 3X/week   Barriers to D/C:            Co-evaluation              AM-PAC OT "6 Clicks" Daily Activity     Outcome Measure Help from another person eating meals?: A Little Help from another person taking care of personal grooming?: A Little Help from another person toileting, which includes using toliet, bedpan, or urinal?: Total Help from another person bathing (including washing, rinsing, drying)?: A Lot Help from another person to put on and taking off regular upper body clothing?: A Lot Help from another person to put on and taking off regular lower body clothing?: Total 6 Click Score: 12   End of Session Equipment Utilized During Treatment: Gait belt Nurse Communication: Mobility status  Activity Tolerance: Patient tolerated treatment well Patient left: in chair;with nursing/sitter in room;with chair alarm set;with call bell/phone within reach  OT Visit Diagnosis: Unsteadiness on feet (R26.81);Muscle weakness (generalized) (M62.81);Hemiplegia and hemiparesis Hemiplegia -  Right/Left: Right Hemiplegia - dominant/non-dominant: Dominant                Time: 1040-1104 OT Time Calculation (min): 24 min Charges:  OT Evaluation $OT Eval High  Complexity: 1 High  Careena Degraffenreid OTR/L acute rehab services Office: [phone] [facility] 10/02/2019, 1:10 PM

## 2019-10-02 NOTE — Progress Notes (Addendum)
Carotid artery duplex is completed. Refer to "CV Proc" under chart review to view preliminary results.  Preliminary results discussed with Dr. Edilia Bo.  10/02/2019 12:17 PM Eula Fried., MHA, RVT, RDCS, RDMS

## 2019-10-02 NOTE — Evaluation (Signed)
Physical Therapy Evaluation Patient Details Name: Elizabeth Tapia MRN: [id number] DOB: 1929-12-28 Today's Date: 10/02/2019   History of Present Illness  86 year old female with a history of hypertension, hyperlipidemia, CKD IIIa, ?rheumatoid arthritis, anxiety, and depression who presented 10/01/19 with slurred speech, R sided weakness, and unable to walk. MRI multiple foci of acute infarcts within the left frontal and parietal lobes in a watershed distribution. +L ICA stenosis and pt considering L CEA  Clinical Impression   Pt admitted with above diagnosis. All information provided by patient. Patient lives alone with grandson staying "most nights" (works during the day) and retired son checking in on her "most days." She was independent with mobility using no device and required assist for community needs due to no longer drives. She is aware of her rt-sided weakness, however is resistant to stay and get therapy to improve (stating she can get better at home). She agreed to therapy while she is here to improve her walking (required mod assist to take 2 side-steps with HR up to 144 bpm). Pt currently with functional limitations due to the deficits listed below (see PT Problem List). Pt will benefit from skilled PT to increase their independence and safety with mobility to allow discharge to the venue listed below.       Follow Up Recommendations CIR;Supervision/Assistance - 24 hour    Equipment Recommendations  Other (comment) (TBD next venue)    Recommendations for Other Services Rehab consult     Precautions / Restrictions Precautions Precautions: Fall      Mobility  Bed Mobility Overal bed mobility: Needs Assistance Bed Mobility: Supine to Sit;Sit to Supine;Rolling Rolling: Supervision (to rt)   Supine to sit: Mod assist Sit to supine: Min assist   General bed mobility comments: HOB flat, no rail; pt able to scoot laterally in supine to her left, unable to scoot to her right  (repeatedly rolls rt instead); able to advance legs off left EOB with incr time for RLE, pulls to sitting, however with posterior LOB wwhen attempting to seated scoot to EOB and get feet to the floor required mod assist   Transfers Overall transfer level: Needs assistance Equipment used:  (bed rail on her left) Transfers: Sit to/from Stand Sit to Stand: Mod assist         General transfer comment: RLE tended to abduct and required assist for balance and to adduct RLE into weightbearing position to allow side step with LLE  Ambulation/Gait             General Gait Details: side step x 2 to pt's left only due to elevated HR and will need +2 assist for lines  Stairs            Wheelchair Mobility    Modified Rankin (Stroke Patients Only) Modified Rankin (Stroke Patients Only) Pre-Morbid Rankin Score: Slight disability Modified Rankin: Severe disability     Balance Overall balance assessment: Needs assistance Sitting-balance support: Single extremity supported;Feet supported Sitting balance-Leahy Scale: Poor   Postural control: Posterior lean Standing balance support: Single extremity supported Standing balance-Leahy Scale: Poor                               Pertinent Vitals/Pain Pain Assessment: No/denies pain    Home Living Family/patient expects to be discharged to:: Private residence Living Arrangements: Alone Available Help at Discharge: Family;Available PRN/intermittently (grandson stays most nights; son (retired) checks in most day) Type  of Home: House Home Access: Stairs to enter Entrance Stairs-Rails: Right Entrance Stairs-Number of Steps: 1 Home Layout: One level Home Equipment: Cane - single point;Walker - 2 wheels;Shower seat - built in Additional Comments: pt reports equipment was her mothers (?reliability)    Prior Function Level of Independence: Needs assistance   Gait / Transfers Assistance Needed: in past year fell due to slip  on kitchen floor; otherwise independent  ADL's / Homemaking Assistance Needed: does not drive; family does grocery shopping and cleaning house        Hand Dominance   Dominant Hand: Right    Extremity/Trunk Assessment   Upper Extremity Assessment Upper Extremity Assessment: Defer to OT evaluation    Lower Extremity Assessment Lower Extremity Assessment: RLE deficits/detail RLE Deficits / Details: pt able to isolate movements all joints; dorsiflexion results in supination; hip flexion able to raise leg off bed, knee extension grossly 4/5, ankle DF 4/5 RLE Sensation: WNL (pt denies changes)    Cervical / Trunk Assessment Cervical / Trunk Assessment: Kyphotic  Communication   Communication: Expressive difficulties  Cognition Arousal/Alertness: Awake/alert Behavior During Therapy: WFL for tasks assessed/performed Overall Cognitive Status: No family/caregiver present to determine baseline cognitive functioning Area of Impairment: Safety/judgement;Awareness                         Safety/Judgement: Decreased awareness of safety;Decreased awareness of deficits Awareness: Intellectual   General Comments: Patient able to state her rt side is weak, however also states she can go home and work on getting better--doesn't want to stay in the hospital      General Comments General comments (skin integrity, edema, etc.): HR in supine 110, come to EOB incr to 144 bpm and slowly decr to 127 bpm    Exercises     Assessment/Plan    PT Assessment Patient needs continued PT services  PT Problem List Decreased strength;Decreased activity tolerance;Decreased balance;Decreased mobility;Decreased cognition;Decreased knowledge of use of DME;Decreased safety awareness;Cardiopulmonary status limiting activity       PT Treatment Interventions DME instruction;Gait training;Functional mobility training;Therapeutic activities;Balance training;Neuromuscular re-education;Cognitive  remediation;Patient/family education    PT Goals (Current goals can be found in the Care Plan section)  Acute Rehab PT Goals Patient Stated Goal: to be able to walk PT Goal Formulation: With patient Time For Goal Achievement: 10/16/19 Potential to Achieve Goals: Good    Frequency Min 4X/week   Barriers to discharge Decreased caregiver support unsure if family can provide 24/7     Co-evaluation               AM-PAC PT "6 Clicks" Mobility  Outcome Measure Help needed turning from your back to your side while in a flat bed without using bedrails?: A Little Help needed moving from lying on your back to sitting on the side of a flat bed without using bedrails?: A Lot Help needed moving to and from a bed to a chair (including a wheelchair)?: A Lot Help needed standing up from a chair using your arms (e.g., wheelchair or bedside chair)?: A Lot Help needed to walk in hospital room?: Total Help needed climbing 3-5 steps with a railing? : Total 6 Click Score: 11    End of Session Equipment Utilized During Treatment: Gait belt Activity Tolerance: Treatment limited secondary to medical complications (Comment) (elevated HR) Patient left: in bed;Other (comment) (with transporter ) Nurse Communication: Mobility status;Other (comment) (incr HR with activity) PT Visit Diagnosis: Hemiplegia and hemiparesis;Other symptoms and  signs involving the nervous system (R29.898);Other abnormalities of gait and mobility (R26.89) Hemiplegia - Right/Left: Right Hemiplegia - dominant/non-dominant: Dominant Hemiplegia - caused by: Cerebral infarction    Time: 8675-4492 PT Time Calculation (min) (ACUTE ONLY): 33 min   Charges:   PT Evaluation $PT Eval Moderate Complexity: 1 Mod PT Treatments $Therapeutic Activity: 8-22 mins         Jerolyn Center, PT Pager [phone]   Zena Amos 10/02/2019, 9:57 AM

## 2019-10-02 NOTE — Progress Notes (Signed)
Inpatient Rehab Admissions Coordinator Note:   Per therapy recommendations, pt was screened for CIR candidacy by Estill Dooms, PT, DPT.  At this time we are recommending a CIR consult and I will request an order per our protocol.  Please contact me with questions.   Estill Dooms, PT, DPT [phone] 10/02/19 11:07 AM

## 2019-10-02 NOTE — Progress Notes (Signed)
VASCULAR SURGERY:  As per my note this morning, and the note from Internal Medicine this afternoon, the patient is not interested in left carotid endarterectomy.  Vascular surgery will follow peripherally.  Waverly Ferrari, MD Office: [phone]

## 2019-10-02 NOTE — Progress Notes (Signed)
   VASCULAR SURGERY ASSESSMENT & PLAN:   LEFT BRAIN STROKE: No improvement in the right upper extremity weakness which may actually be a little worse this morning.  Her carotid duplex and echo are pending.  I again discussed with her the possibility of left carotid endarterectomy in order to lower her risk of future stroke.  She is 86 years old, and although she is very viable for her age, she understandably is reluctant to consider any surgery.  I will follow up tomorrow after her work-up is complete.  SUBJECTIVE:   No complaints overnight.  I again discussed with her left carotid endarterectomy given that the tight stenosis on the left is almost certainly the cause for her stroke.  I explained that we could lower her risk of future stroke with surgery.  She is understandably very reluctant to consider surgery given her age.  PHYSICAL EXAM:   Vitals:   10/01/19 2100 10/01/19 2200 10/02/19 0017 10/02/19 0347  BP: (!) 154/81 (!) 162/79 (!) 154/84 (!) 144/72  Pulse: 91 95 92 94  Resp: 18 19 15  (!) 21  Temp:   (!) 97.5 F (36.4 C) 98 F (36.7 C)  TempSrc:   Oral Oral  SpO2: 95% 97% 99% 97%  Weight:   41.7 kg   Height:   5\' 2"  (1.575 m)    Significant right upper extremity weakness which seems a little worse compared to last night.  LABS:   Lab Results  Component Value Date   WBC 6.9 10/01/2019   HGB 11.9 (L) 10/01/2019   HCT 35.0 (L) 10/01/2019   MCV 94.5 10/01/2019   PLT 363 10/01/2019   Lab Results  Component Value Date   CREATININE 1.40 (H) 10/01/2019   Lab Results  Component Value Date   INR 1.0 10/01/2019   CBG (last 3)  Recent Labs    10/01/19 1030  GLUCAP 103*    PROBLEM LIST:    Active Problems:   Stroke [facility])   CURRENT MEDS:   .  stroke: mapping our early stages of recovery book   Does not apply Once  . aspirin  81 mg Oral Daily  . atorvastatin  80 mg Oral Daily  . FLUoxetine  10 mg Oral Daily    10/03/19 Office:  [phone] 10/02/2019

## 2019-10-02 NOTE — Progress Notes (Signed)
OT Cancellation Note  Patient Details Name: Elizabeth Tapia MRN: [id number] DOB: 1929-07-03   Cancelled Treatment:    Reason Eval/Treat Not Completed: Other (comment) (pt leaving room for ECHO at time of arrival; OT will continue with attempts as able) Karinda Cabriales OTR/L acute rehab services Office: [phone]   [facility] 10/02/2019, 9:46 AM

## 2019-10-02 NOTE — Progress Notes (Signed)
  Echocardiogram 2D Echocardiogram has been performed.  Elizabeth Tapia 10/02/2019, 10:29 AM

## 2019-10-02 NOTE — Progress Notes (Signed)
Subjective:  Elizabeth Tapia reports feeling very well this morning, though she is complaining that her right arm feels "heavy" and she is frustrated with her loss of function in this arm. She is very clear that she is not interested in pursuing surgical intervention given her age. Her tremor is improved today; closer to her baseline. She denies any CP, abdominal pain, headache, visual changes, or new weakness today.  She reports some anxiety this morning. Takes valium 2mg  PRN at home and is requesting some today.  PT saw her this morning and recommended CIR for rehab of her R arm. We discussed this with her and she states that while she prefers to rehab at home, she will consider inpatient rehab and is open to talking to their team. Her primary goal at this time is returning home.   Objective:  Vital signs in last 24 hours: Vitals:   10/02/19 0017 10/02/19 0347 10/02/19 0818 10/02/19 1125  BP: (!) 154/84 (!) 144/72 (!) 141/81 124/76  Pulse: 92 94 91 (!) 103  Resp: 15 (!) 21 20   Temp: (!) 97.5 F (36.4 C) 98 F (36.7 C) 98.2 F (36.8 C) 98.2 F (36.8 C)  TempSrc: Oral Oral Oral Oral  SpO2: 99% 97% 99% 98%  Weight: 41.7 kg     Height: 5\' 2"  (1.575 m)      Weight change:   Intake/Output Summary (Last 24 hours) at 10/02/2019 1327 Last data filed at 10/02/2019 0900 Gross per 24 hour  Intake 120 ml  Output --  Net 120 ml   Physical Exam Vitals and nursing note reviewed.  Constitutional:      General: She is not in acute distress. Cardiovascular:     Rate and Rhythm: Tachycardia present.     Heart sounds: Normal heart sounds.  Pulmonary:     Effort: Pulmonary effort is normal.     Breath sounds: Normal breath sounds.  Musculoskeletal:        General: No swelling.     Right lower leg: No edema.     Left lower leg: No edema.  Neurological:     Mental Status: She is alert.     Motor: No weakness.     Comments: R sided facial droop unchanged from yesterday. Strength in R arm  remains 3/5. 5/5 in all other extremities.  Tremor is improved from yesterday. Present at rest and unchanged with activity.       Assessment/Plan:  Active Problems:   Stroke [facility])  Acute Stroke: Patient with history of HTN, HLD presented with difficulty speaking and R sided weakness. CT and MRI brain consistent with acute ischemic stroke of L parietal lobe and possible petechial bleed. CTA Neck with critical stenosis of L internal carotid artery >90%. Carotid duplex 10/04/2019 w/ velocities c/w 80-99% stenosis of L ICA. TTE with EF  70-75%, grade 1 diastolic dysfunction, no evidence of intracardiac source of embolism. Patient has been in sinus rhythm throughout hospitalization.  The main neurologic deficit noted on exam is severe weakness of the R arm. Strength 3/5, though perhaps marginally improved from yesterday.   The most likely source of Elizabeth Tapia's stroke is a L carotid atheroembolism. Vascular surgery is following and she is not interested in endarterectomy given her age. She was living mostly independently prior to this hospitalization, though she has a grandson who lives with her and a son nearby who visits regularly. Her goals at this time are to return home and regain as much independent  function as possible. Likely that CIR would be a good fit for her if she is amenable to it.  - Vascular surgery and neurology following, grateful for their recommendations. - Referral to CIR - Patient not interested in endarterectomy  - LDL 155, will start atorvastatin 80mg  daily - Baby aspirin daily - Continue LR 100 mL/hr to maintain good perfusion.    Tachycardia: Patient becoming progressively more tachycardic since last night. Likely that this is rebound tach from holding her B-blocker/Valium. - Restarting Valium today. B-blocker tomorrow.  - On telemetry. Continue to monitor vitals.    MDD:  Anxiety: Controlled on home Prozac and PRN Valium - Continue home meds  HTN: Controlled on  propranolol at home. Holding for now for permissive hypertension.  - Restart tomorrow  HLD: Information gathered from chart review as patient is not a great historian. It appears she does carry a diagnosis of HLD, but her only home med is cholestyramine. This is an unusual monotherapy. She also has a history of cholecystectomy, so possible that her cholestyramine was prescribed for diarrhea d/t poor bile acid absorption. Total cholesterol 214; HDL 44; LDL 155; Trigs 75. - Holding home cholestyramine for now. - Add statin as above.   Health Maintenance: Patient requesting inpatient COVID vaccination. - Consult inpatient vaccination team.    LOS: 1 day   [place], Medical Student 10/02/2019, 1:27 PM

## 2019-10-03 ENCOUNTER — Inpatient Hospital Stay: Payer: Medicare Other

## 2019-10-03 ENCOUNTER — Inpatient Hospital Stay (HOSPITAL_COMMUNITY): Payer: Medicare Other

## 2019-10-03 DIAGNOSIS — I129 Hypertensive chronic kidney disease with stage 1 through stage 4 chronic kidney disease, or unspecified chronic kidney disease: Secondary | ICD-10-CM

## 2019-10-03 DIAGNOSIS — I69322 Dysarthria following cerebral infarction: Secondary | ICD-10-CM

## 2019-10-03 DIAGNOSIS — Z23 Encounter for immunization: Secondary | ICD-10-CM

## 2019-10-03 DIAGNOSIS — G250 Essential tremor: Secondary | ICD-10-CM

## 2019-10-03 LAB — COMPREHENSIVE METABOLIC PANEL
ALT: 17 U/L (ref 0–44)
AST: 29 U/L (ref 15–41)
Albumin: 3.3 g/dL — ABNORMAL LOW (ref 3.5–5.0)
Alkaline Phosphatase: 68 U/L (ref 38–126)
Anion gap: 12 (ref 5–15)
BUN: 17 mg/dL (ref 8–23)
CO2: 22 mmol/L (ref 22–32)
Calcium: 9.2 mg/dL (ref 8.9–10.3)
Chloride: 102 mmol/L (ref 98–111)
Creatinine, Ser: 1.32 mg/dL — ABNORMAL HIGH (ref 0.44–1.00)
GFR calc Af Amer: 42 mL/min — ABNORMAL LOW (ref >60)
GFR calc non Af Amer: 36 mL/min — ABNORMAL LOW (ref >60)
Glucose, Bld: 104 mg/dL — ABNORMAL HIGH (ref 70–99)
Potassium: 3.9 mmol/L (ref 3.5–5.1)
Sodium: 136 mmol/L (ref 135–145)
Total Bilirubin: 1.1 mg/dL (ref 0.3–1.2)
Total Protein: 6.8 g/dL (ref 6.5–8.1)

## 2019-10-03 LAB — CBC
HCT: 32.5 % — ABNORMAL LOW (ref 36.0–46.0)
Hemoglobin: 10.6 g/dL — ABNORMAL LOW (ref 12.0–15.0)
MCH: 30.2 pg (ref 26.0–34.0)
MCHC: 32.6 g/dL (ref 30.0–36.0)
MCV: 92.6 fL (ref 80.0–100.0)
Platelets: 379 10*3/uL (ref 150–400)
RBC: 3.51 MIL/uL — ABNORMAL LOW (ref 3.87–5.11)
RDW: 13.7 % (ref 11.5–15.5)
WBC: 6.7 10*3/uL (ref 4.0–10.5)
nRBC: 0 % (ref 0.0–0.2)

## 2019-10-03 LAB — IRON AND TIBC
Iron: 21 ug/dL — ABNORMAL LOW (ref 28–170)
Saturation Ratios: 7 % — ABNORMAL LOW (ref 10.4–31.8)
TIBC: 309 ug/dL (ref 250–450)
UIBC: 288 ug/dL

## 2019-10-03 LAB — FERRITIN: Ferritin: 77 ng/mL (ref 11–307)

## 2019-10-03 MED ORDER — DIAZEPAM 2 MG PO TABS
2.0000 mg | ORAL_TABLET | Freq: Two times a day (BID) | ORAL | Status: DC
  Administered 2019-10-03 – 2019-10-07 (×8): 2 mg via ORAL
  Filled 2019-10-03 (×8): qty 1

## 2019-10-03 MED ORDER — PROPRANOLOL HCL 10 MG PO TABS
20.0000 mg | ORAL_TABLET | Freq: Two times a day (BID) | ORAL | Status: DC
  Administered 2019-10-03 – 2019-10-06 (×6): 20 mg via ORAL
  Filled 2019-10-03 (×6): qty 1
  Filled 2019-10-03: qty 2

## 2019-10-03 MED ORDER — MUPIROCIN 2 % EX OINT
1.0000 "application " | TOPICAL_OINTMENT | Freq: Two times a day (BID) | CUTANEOUS | Status: DC
  Administered 2019-10-03 – 2019-10-07 (×7): 1 via NASAL
  Filled 2019-10-03 (×3): qty 22

## 2019-10-03 NOTE — Progress Notes (Signed)
Physical Therapy Treatment Patient Details Name: Elizabeth Tapia MRN: [id number] DOB: 1929/07/11 Today's Date: 10/03/2019    History of Present Illness 86 year old female with a history of hypertension, hyperlipidemia, CKD IIIa, ?rheumatoid arthritis, anxiety, and depression who presented 10/01/19 with slurred speech, R sided weakness, and unable to walk. MRI multiple foci of acute infarcts within the left frontal and parietal lobes in a watershed distribution. +L ICA stenosis and pt considering L CEA    PT Comments    Patient noted to have much weaker voice than yesterday (noted difficulty with swallowing per RN note and SLP consulted). She is very motivated and cooperative throughout session. Per son, she had a kyphotic posture in standing PTA. She stands with moderate assistance with support to fully extend RLE x 2 reps. Pivots to chair with moderate assist with rt knee support, although knee "gives" and not fully buckling. Will need 2 person assist to attempt ambulation (especially duet to pt's fear when standing)    Follow Up Recommendations  CIR;Supervision/Assistance - 24 hour     Equipment Recommendations  Other (comment) (TBD next venue)    Recommendations for Other Services Rehab consult     Precautions / Restrictions Precautions Precautions: Fall    Mobility  Bed Mobility Overal bed mobility: Needs Assistance Bed Mobility: Rolling;Sidelying to Sit Rolling: Min assist (to L side) Sidelying to sit: Mod assist       General bed mobility comments: HOB flat, cues for squencing to sit to EOB to L side, +using rail and requiring increased A at trunk for transition to sitting EOB, then use of bed pad to help advance rt hip forward for feet to reach the floor (pt advancing her left hip)  Transfers Overall transfer level: Needs assistance Equipment used: None Transfers: Sit to/from [employer] Sit to Stand: Mod assist Stand pivot transfers: Mod assist        General transfer comment: stood x 2 from bed (once with PT beside her--pt more fearful; PT in front with pt holding on to PTs arm pt was able to stand more upright)  Ambulation/Gait             General Gait Details: pivotal steps to her left; Rt knee stabilized while shifting over her RLE to allow her to side step with LLE   Stairs             Wheelchair Mobility    Modified Rankin (Stroke Patients Only) Modified Rankin (Stroke Patients Only) Pre-Morbid Rankin Score: Slight disability Modified Rankin: Moderately severe disability     Balance Overall balance assessment: Needs assistance Sitting-balance support: Single extremity supported Sitting balance-Leahy Scale: Poor Sitting balance - Comments: typically leaning forward without imbalance, but appeared unsafe at times   Standing balance support: Single extremity supported Standing balance-Leahy Scale: Poor                              Cognition Arousal/Alertness: Awake/alert Behavior During Therapy: Impulsive (mildly impulsive; did well with step by step instructions) Overall Cognitive Status: Impaired/Different from baseline Area of Impairment: Safety/judgement;Awareness                         Safety/Judgement: Decreased awareness of safety Awareness: Intellectual   General Comments: better safety awareness (agrees to stay for rehab)      Exercises      General Comments General comments (skin integrity, edema, etc.):  Son present      Pertinent Vitals/Pain Pain Assessment: No/denies pain    Home Living                      Prior Function            PT Goals (current goals can now be found in the care plan section) Acute Rehab PT Goals Patient Stated Goal: to get up by myself PT Goal Formulation: With patient Time For Goal Achievement: 10/16/19 Potential to Achieve Goals: Good Progress towards PT goals: Progressing toward goals    Frequency    Min  4X/week      PT Plan Current plan remains appropriate    Co-evaluation              AM-PAC PT "6 Clicks" Mobility   Outcome Measure  Help needed turning from your back to your side while in a flat bed without using bedrails?: A Little Help needed moving from lying on your back to sitting on the side of a flat bed without using bedrails?: A Lot Help needed moving to and from a bed to a chair (including a wheelchair)?: A Lot Help needed standing up from a chair using your arms (e.g., wheelchair or bedside chair)?: A Lot Help needed to walk in hospital room?: Total Help needed climbing 3-5 steps with a railing? : Total 6 Click Score: 11    End of Session Equipment Utilized During Treatment: Gait belt Activity Tolerance: Patient tolerated treatment well Patient left: in chair;with call bell/phone within reach;with chair alarm set;with family/visitor present;Other (comment) (RN to give vaccination)   PT Visit Diagnosis: Hemiplegia and hemiparesis;Other symptoms and signs involving the nervous system (R29.898);Other abnormalities of gait and mobility (R26.89) Hemiplegia - Right/Left: Right Hemiplegia - dominant/non-dominant: Dominant Hemiplegia - caused by: Cerebral infarction     Time: 7824-2353 PT Time Calculation (min) (ACUTE ONLY): 22 min  Charges:  $Therapeutic Activity: 8-22 mins                      Jerolyn Center, PT Pager [phone]    Zena Amos 10/03/2019, 12:39 PM

## 2019-10-03 NOTE — Progress Notes (Signed)
Pt had a hard time swallowing her her pills coughed and choked after 2 pills thus unable to give the rest of her meds. When tried to give her water she swallows multiple times as if it's not going down. Speech might need to assess will notify MD.

## 2019-10-03 NOTE — Evaluation (Signed)
Clinical/Bedside Swallow Evaluation Patient Details  Name: Carnell Casamento MRN: [id number] Date of Birth: 04-05-1929  Today's Date: 10/03/2019 Time: SLP Start Time (ACUTE ONLY): 1316 SLP Stop Time (ACUTE ONLY): 1337 SLP Time Calculation (min) (ACUTE ONLY): 21 min  Past Medical History:  Past Medical History:  Diagnosis Date  . Anxiety   . CKD (chronic kidney disease)   . Depression   . Essential tremor   . HTN (hypertension)   . IBS (irritable bowel syndrome)    with diarrhea   Past Surgical History:  Past Surgical History:  Procedure Laterality Date  . EYE SURGERY     HPI:  Ms. Danser is an 86 year old female with a history of hypertension, hyperlipidemia, CKD IIIa, ?rheumatoid arthritis, anxiety, and depression who presents with an acute stroke. Patient was in her usual state of health last night and awoke this morning with slurred speech and R sided weakness. She was unable to walk. She lives with her grandson and called to him for help. She has no history of prior stroke or known vascular disease; 10/01/19 MRI head indicated: This demonstrates multiple foci of acute infarcts within the left frontal and parietal lobes in a watershed distribution. The most confluent area of acute infarct is in the left parietal lobe (distal left MCA territory) and correlates with the hypodensity seen on CT initially passed Yale swallow screen with new concerns for dysphagia per nursing stating "pills getting stuck, coughing with food/liquids."  BSE generated.   Assessment / Plan / Recommendation Clinical Impression  Pt presents with oropharyngeal/pharyngoesophageal dysphagia characterized by slight right oral weakness/decreased coordination and mandibular tremoring during consumption of various consistencies including: ice chips, thin via tsp-straw sips and puree; pt refused solids d/t globus sensation; vocal quality remained aphonic throughout BSE with pt stating via mouthing/whispering this is not  her "normal voice."  Suspected delay in the initiation of the swallow (which may be affected by presbyphagia given age) paired with effects of CVA; delayed cough, globus sensation and multiple swallows also noted throughout BSE suggesting potential pharyngeal/esophageal dysphagia; MBS recommended to r/o aspiration and determine safest diet to consume post-CVA.  Pt and son in agreement. ST will f/u for speech/dysphagia while in acute setting; thank you for this consult. SLP Visit Diagnosis: Dysphagia, oropharyngeal phase (R13.12);Dysphagia, pharyngoesophageal phase (R13.14);Aphonia (R49.1)    Aspiration Risk  Mild aspiration risk;Moderate aspiration risk    Diet Recommendation     Medication Administration: Crushed with puree    Other  Recommendations Recommended Consults: Consider esophageal assessment Oral Care Recommendations: Oral care BID   Follow up Recommendations Other (comment) (TBD)      Frequency and Duration min 2x/week  1 week       Prognosis Prognosis for Safe Diet Advancement: Good      Swallow Study   General Date of Onset: 10/01/19 HPI: Ms. Arrighi is an 86 year old female with a history of hypertension, hyperlipidemia, CKD IIIa, ?rheumatoid arthritis, anxiety, and depression who presents with an acute stroke. Patient was in her usual state of health last night and awoke this morning with slurred speech and R sided weakness. She was unable to walk. She lives with her grandson and called to him for help. She has no history of prior stroke or known vascular disease.  Type of Study: Bedside Swallow Evaluation Previous Swallow Assessment:  (passed yale upon admission 10/01/19) Diet Prior to this Study: Regular;Thin liquids Temperature Spikes Noted: No Respiratory Status: Room air History of Recent Intubation: No Behavior/Cognition:  Alert;Cooperative Oral Cavity Assessment: Within Functional Limits Oral Care Completed by SLP: Recent completion by staff Oral Cavity -  Dentition: Adequate natural dentition Vision: Functional for self-feeding Self-Feeding Abilities: Needs assist Patient Positioning: Upright in chair Baseline Vocal Quality: Aphonic Volitional Cough: Strong Volitional Swallow: Able to elicit    Oral/Motor/Sensory Function Overall Oral Motor/Sensory Function: Mild impairment Facial ROM: Reduced right Facial Symmetry: Abnormal symmetry right Facial Strength: Reduced right Facial Sensation: Within Functional Limits Lingual ROM: Within Functional Limits Lingual Symmetry: Abnormal symmetry right;Other (Comment) (slight) Lingual Strength: Reduced Lingual Sensation: Within Functional Limits Velum: Within Functional Limits Mandible: Impaired (tremor noted)   Ice Chips Ice chips: Impaired Presentation: Spoon Pharyngeal Phase Impairments: Multiple swallows   Thin Liquid Thin Liquid: Impaired Presentation: Cup;Straw Pharyngeal  Phase Impairments: Suspected delayed Swallow;Multiple swallows;Throat Clearing - Delayed    Nectar Thick Nectar Thick Liquid: Not tested   Honey Thick Honey Thick Liquid: Not tested   Puree Puree: Impaired Presentation: Spoon Pharyngeal Phase Impairments: Suspected delayed Swallow;Multiple swallows   Solid     Solid: Not tested (pt refused)      Tressie Stalker, M.S., CCC-SLP 10/03/2019,2:50 PM

## 2019-10-03 NOTE — Evaluation (Signed)
Speech Language Pathology Evaluation Patient Details Name: Leonela Kivi MRN: [id number] DOB: 1929/01/17 Today's Date: 10/03/2019 Time: 9675-9163 SLP Time Calculation (min) (ACUTE ONLY): 21 min  Problem List:  Patient Active Problem List   Diagnosis Date Noted  . Acute CVA (cerebrovascular accident) (HCC)   . Benign essential HTN   . Anxiety state   . Depression   . Chronic kidney disease   . Prediabetes   . Stroke [facility]) 10/01/2019   Past Medical History:  Past Medical History:  Diagnosis Date  . Anxiety   . CKD (chronic kidney disease)   . Depression   . Essential tremor   . HTN (hypertension)   . IBS (irritable bowel syndrome)    with diarrhea   Past Surgical History:  Past Surgical History:  Procedure Laterality Date  . EYE SURGERY     HPI:  Ms. Dollens is an 86 year old female with a history of hypertension, hyperlipidemia, CKD IIIa, ?rheumatoid arthritis, anxiety, and depression who presents with an acute stroke. Patient was in her usual state of health last night and awoke this morning with slurred speech and R sided weakness. She was unable to walk. She lives with her grandson and called to him for help. She has no history of prior stroke or known vascular disease.   10/01/19 MRI head indicated: This demonstrates multiple foci of acute infarcts within the left frontal and parietal lobes in a watershed distribution. The most confluent area of acute infarct is in the left parietal lobe (distal left MCA territory) and correlates with the hypodensity seen on CT  Assessment / Plan / Recommendation Clinical Impression  Full assessment unable to be completed d/t aphonic vocal quality/limited production of speech; cognitive impairment evident in the areas of safety/judgment within functional tasks during BSE/SLE.  Intelligibility affected d/t vocal quality being aphonic entire assessment.  Pt demonstrated awareness of deficits within area of swallowing during BSE indicating  globus sensation/change in vocal quality; Intellectual awareness deficit noted in PT evaluation per chart review.   Decreased recall of words noted during assessment, but pt was oriented x4.  Writing not assessed as dominant hand affected by CVA.  Reading unable to be fully assessed d/t vocal quality, but pt could read nametag/environmental signs within room via whispering or mouthing words. On-going assessment of cognition recommended as vocal quality improves.  Pt will be f/u by ST while in acute setting for cognitive deficits/dyphagia management.  Pending MBS.  Thank you for this consult.    SLP Assessment  SLP Recommendation/Assessment: Patient needs continued Speech Language Pathology Services SLP Visit Diagnosis: Dysphagia, oropharyngeal phase (R13.12);Dysphagia, pharyngoesophageal phase (R13.14);Aphonia (R49.1);Cognitive communication deficit (R41.841)    Follow Up Recommendations  Other (comment) (TBD)    Frequency and Duration min 2x/week  1 week      SLP Evaluation Cognition  Overall Cognitive Status: Impaired/Different from baseline Arousal/Alertness: Awake/alert Orientation Level: Oriented X4 Attention: Sustained Sustained Attention: Appears intact Memory: Impaired Memory Impairment: Retrieval deficit;Decreased recall of new information;Decreased short term memory Decreased Short Term Memory: Verbal basic;Functional basic Immediate Memory Recall: Sock;Blue;Bed Memory Recall Sock: With Cue Memory Recall Blue: Without Cue Memory Recall Bed: Without Cue Awareness: Impaired Awareness Impairment: Intellectual impairment Problem Solving: Impaired Problem Solving Impairment: Verbal basic;Functional basic Executive Function: Decision Making Decision Making: Impaired Decision Making Impairment: Verbal basic;Functional basic Behaviors: Impulsive Safety/Judgment: Impaired       Comprehension  Auditory Comprehension Overall Auditory Comprehension: Impaired Yes/No Questions:  Within Functional Limits Commands: Impaired Multistep Basic Commands: 50-74%  accurate Conversation: Simple Other Conversation Comments:  (Limited d/t vocal quality) EffectiveTechniques: Repetition;Visual/Gestural cues Visual Recognition/Discrimination Discrimination: Not tested Reading Comprehension Reading Status: Not tested    Expression Expression Primary Mode of Expression: Verbal Verbal Expression Overall Verbal Expression: Other (comment) (affected d/t aphonic vocal quality; gestured w/ verbal ) Initiation: No impairment Level of Generative/Spontaneous Verbalization: Phrase Repetition:  (DTA d/t vocal quality) Naming: Not tested (DTA d/t vocal quality) Interfering Components: Speech intelligibility Non-Verbal Means of Communication: Gestures Other Verbal Expression Comments:  (Pt able to state basic info via mouthing/whispering ) Written Expression Written Expression: Not tested   Oral / Motor  Oral Motor/Sensory Function Overall Oral Motor/Sensory Function: Mild impairment Facial ROM: Reduced right Facial Symmetry: Abnormal symmetry right Facial Strength: Reduced right Facial Sensation: Within Functional Limits Lingual ROM: Within Functional Limits Lingual Symmetry: Abnormal symmetry right;Other (Comment) (slight) Lingual Strength: Reduced Lingual Sensation: Within Functional Limits Velum: Within Functional Limits Mandible: Impaired (tremor noted) Motor Speech Overall Motor Speech: Other (comment) (DTA) Respiration: Within functional limits Phonation: Aphonic Articulation: Impaired Level of Impairment: Phrase Intelligibility: Intelligibility reduced Word: 25-49% accurate Phrase: 0-24% accurate Sentence: 0-24% accurate Conversation: 0-24% accurate Motor Planning: Not tested                      Tressie Stalker, M.S., CCC-SLP 10/03/2019, 3:11 PM

## 2019-10-03 NOTE — Progress Notes (Signed)
86 year old female that was seen in consultation on Tuesday by Dr. Edilia Bo for symptomatic left carotid stenosis with left hemispheric event.  Ultimately after initial evaluation and recommending carotid endarterectomy patient was not interested in surgery and Dr. Edilia Bo signed off.  We were contacted today that after meeting with rehabilitation the patient wanted to reconsider surgery.  I discussed with both the patient and her son at bedside who both agree to proceed with left carotid endarterectomy.  Plan is surgery tomorrow and please have the patient n.p.o. after midnight.  I discussed risk and benefits including cranial nerve injury, bleeding sometimes requiring return to the OR, infection, risk of anesthesia, 1% risk of perioperative stroke.  We discussed goal of surgery is ultimately stroke risk reduction.  I did review her CTA neck and MRI brain.  She presented with right sided weakness and feels she is at her baseline since that event.  Very functional prior to this event.  Cephus Shelling, MD [facility] Office: [phone]   Cephus Shelling

## 2019-10-03 NOTE — Progress Notes (Signed)
Subjective:  Ms. Wineland reports feeling worse this morning. She is having more difficulty speaking and R sided weakness compared to yesterday. She also reports new difficulty swallowing with both liquids and solids.  She has been discussing her goals of care with her family and has decided that she would like to go through with endarterectomy after all. She understands that the surgery is unlikely to benefit the symptoms she is having now, but is to reduce the risk of future stroke. She does not feel that she is being pressured into this decision. She and her son state that their goal is "to keep her comfortable and at home as long as possible." Per her son, she is mentating at her baseline. She is having no new vision changes, chest pain, or difficulty breathing.  Reports that she remains anxious and has not had any doses of her PRN Valium.   Objective:  Vital signs in last 24 hours: Vitals:   10/02/19 1951 10/02/19 2350 10/03/19 0332 10/03/19 0804  BP: 136/80 (!) 153/75 (!) 150/83 (!) 143/77  Pulse: 99 90 (!) 101 88  Resp:  18 18 20   Temp: 98.3 F (36.8 C) 98.2 F (36.8 C) 97.9 F (36.6 C) 98.2 F (36.8 C)  TempSrc: Oral Oral Oral Oral  SpO2: 96% 96% 100% 96%  Weight:      Height:       Weight change:   Intake/Output Summary (Last 24 hours) at 10/03/2019 1106 Last data filed at 10/03/2019 0339 Gross per 24 hour  Intake 1087.28 ml  Output 1100 ml  Net -12.72 ml   Physical Exam Vitals and nursing note reviewed.  Constitutional:      General: She is not in acute distress. Cardiovascular:     Rate and Rhythm: Regular rhythm. Tachycardia present.     Heart sounds: Normal heart sounds.     Comments: Trace pitting edema of bilateral LE Pulmonary:     Effort: Pulmonary effort is normal.     Breath sounds: Normal breath sounds.  Abdominal:     General: Bowel sounds are normal.     Tenderness: There is no abdominal tenderness.  Neurological:     Mental Status: She is  alert.     Cranial Nerves: Dysarthria (worse than yesterday) and facial asymmetry (worse than yesterday) present.     Comments: Attempts to smile, but appears to require several accessory muscles.  R-sided facial droop worse that previous.  3/5 strength in R arm. 4/5 strength in R leg. 5/5 strength in LUE and LLE.  Essential tremor improved from yesterday.  Psychiatric:        Mood and Affect: Mood is anxious.     Assessment/Plan:  Active Problems:   Stroke [facility])   Acute CVA (cerebrovascular accident) (HCC)   Benign essential HTN   Anxiety state   Depression   Chronic kidney disease   Prediabetes  Acute Stroke: Patient with history of HTN, HLD presented with difficulty speaking and R sided weakness. CT and MRI brain consistent with acute ischemic stroke of L parietal lobe and possible petechial bleed. CTA Neck with critical stenosis of L internal carotid artery >90%. Carotid duplex [facility] w/ velocities c/w 80-99% stenosis of L ICA. TTE with EF  70-75%, grade 1 diastolic dysfunction, no evidence of intracardiac source of embolism. Patient has been in sinus rhythm throughout hospitalization.  Dysarthria and R sided weakness worse in last day. 3/5 strength in RUE, 4/5 in RLE. CT head unchanged from previous.  The most likely source of Ms. Mandt's stroke is a L carotid atheroembolism. She is scheduled for L carotid endarterectomy tomorrow. It seems she came to this decision independently and without undue pressure from her family. After surgery, she will likely need inpatient rehabilitation to regain strength and mobility. She lived relatively independently prior to this event and has a strong desire to ultimately return home.  - L carotid endarterectomy tomorrow.  - CIR aware of patient, will re-evaluate after surgery  - Hold aspirin for surgery - Atorvastatin 80mg  daily - Consider adding Plavix after surgery   Tachycardia: Improving since yesterday, HR still around 100. Likely that  this was rebound tach from holding her B-blocker/Valium. - On telemetry. Continue to monitor vitals.    MDD:  Anxiety: Controlled on home Prozac and PRN Valium - Continue home Prozac - Scheduling Valium BID  HTN: Controlled on propranolol 60mg  BID at home. Had been holding for permissive hypertension. - Restart propranolol at 20mg  BID today and titrate up to her home dose of 60mg  BID  Essential Tremor: Patient with long-standing essential tremor most noticeable in L arm and perioral muscles. Improved today compared to admission. Possible that her home propranolol was providing some relief and that she is improving now that we are restarting this.  - Propranolol as above.   HLD: Information gathered from chart review as patient is not a great historian. It appears she does carry a diagnosis of HLD, but her only home med is cholestyramine. This is an unusual monotherapy. She also has a history of cholecystectomy, so possible that her cholestyramine was prescribed for diarrhea d/t poor bile acid absorption. Total cholesterol 214; HDL 44; LDL 155; Trigs 75. - Holding home cholestyramine for now. - Statin as above  Health Maintenance: Patient requesting inpatient COVID vaccination. - Received first dose of Moderna vaccine this morning   LOS: 2 days   [place], Medical Student 10/03/2019, 11:06 AM

## 2019-10-03 NOTE — Progress Notes (Signed)
   Covid-19 Vaccination Clinic  Name:  Elizabeth Tapia    MRN: [id number] DOB: January 20, 1929  10/03/2019  Ms. [name] was observed post Covid-19 immunization for 15 minutes without incident. She was provided with Vaccine Information Sheet and instruction to access the V-Safe system.   Ms. [name] was instructed to call 911 with any severe reactions post vaccine: Marland Kitchen Difficulty breathing  . Swelling of face and throat  . A fast heartbeat  . A bad rash all over body  . Dizziness and weakness   Immunizations Administered    Name Date Dose VIS Date Route   Moderna COVID-19 Vaccine 10/03/2019 12:03 PM 0.5 mL 12/2018 Intramuscular   Manufacturer: Moderna   Lot: [id number]   NDC: 22025-427-06

## 2019-10-03 NOTE — PMR Pre-admission (Signed)
PMR Admission Coordinator Pre-Admission Assessment ° °Patient: Elizabeth Tapia is an 86 y.o., female °MRN: [id number] °DOB: 07/07/1929 °Height: 5' 2" (157.5 cm) °Weight: 41.7 kg °            °Insurance Information °HMO:     PPO:      PCP:      IPA:      80/20:      OTHER:  °PRIMARY: Medicare A and B      Policy#: [id number]      Subscriber: pt °CM Name:       Phone#:      Fax#:  °Pre-Cert#: verified online      Employer:  °Benefits:  Phone #:      Name:  °Eff. Date: 09/03/96 A and B     Deduct: $1484      Out of Pocket Max: n/a      Life Max: n/a °CIR: 100%      SNF: 20 full days °Outpatient: 80%     Co-Pay: 20% °Home Health: 100%      Co-Pay:  °DME: 80%     Co-Pay: 20% °Providers: pt choice °SECONDARY: BCBS Supplement      Policy#: [id number]      Phone#:  ° °Financial Counselor:       Phone#:  ° °The “Data Collection Information Summary” for patients in Inpatient Rehabilitation Facilities with attached “Privacy Act Statement-Health Care Records” was provided and verbally reviewed with: Patient and Family ° °Emergency Contact Information °Contact Information   ° Name Relation Home Work Mobile  ° Pyle, [name] Son   [phone]  °  ° °Current Medical History  °Patient Admitting Diagnosis: L ICA CVA °History of Present Illness: [name] is a 86 y.o. RH-female with history of HTN, CKD, anxiety/depression, tremors who was admitted on 09/11/2019 with dysarthria and right hemiparesis.  History taken from chart review, son, and patient due to cognitive slowing. Patient also reported having had malaise with decrease in appetite times few days.  CTA head/neck showed critical stenosis left ICA greater than 90% with string sign, hypoperfusion left parietal lobe with possible distal arterial occlusion left MCA and prominent pannus with subluxation of C1 on C2 compatible with arthropathy and instability--?  CPPD versus RA.  MRI brain showed multiple foci of acute infarcts within left frontal and parietal lobe in  watershed distribution.  She was started on heparin ggt and Dr. Dixon consulted for input. He recommended left carotid endarterectomy--carotid Dopplers pending.  Echocardiogram with ejection fraction of 70-75%, no wall abnormality and moderately elevated pulmonary arterial pressures.  Pt underwent carotid endarterectomy on 10/1.  PT evaluations revealed balance deficits with difficulty standing and CIR recommended to functional decline ° °Complete NIHSS TOTAL: 6 °Glasgow Coma Scale Score: 15 ° °Past Medical History  °Past Medical History:  °Diagnosis Date  °• Anxiety   °• CKD (chronic kidney disease)   °• Depression   °• Essential tremor   °• HTN (hypertension)   °• IBS (irritable bowel syndrome)   ° with diarrhea  ° ° °Family History  °family history includes Alcohol abuse in her brother, father, and sister. ° °Prior Rehab/Hospitalizations:  °Has the patient had prior rehab or hospitalizations prior to admission? No ° °Has the patient had major surgery during 100 days prior to admission? Yes ° °Current Medications  ° °Current Facility-Administered Medications:  °•   stroke: mapping our early stages of recovery book, , Does not apply, Once, Collins, Emma M, PA-C °•  0.9 %    sodium chloride infusion, 500 mL, Intravenous, Once PRN, Collins, Emma M, PA-C °•  0.9 %  sodium chloride infusion, , Intravenous, Continuous, Collins, Emma M, PA-C, Paused at 10/05/19 1128 °•  acetaminophen (TYLENOL) tablet 650 mg, 650 mg, Oral, Q4H PRN, 650 mg at 10/06/19 2115 **OR** acetaminophen (TYLENOL) 160 MG/5ML solution 650 mg, 650 mg, Per Tube, Q4H PRN **OR** acetaminophen (TYLENOL) suppository 650 mg, 650 mg, Rectal, Q4H PRN, Collins, Emma M, PA-C °•  alum & mag hydroxide-simeth (MAALOX/MYLANTA) 200-200-20 MG/5ML suspension 15-30 mL, 15-30 mL, Oral, Q2H PRN, Collins, Emma M, PA-C °•  aspirin EC tablet 325 mg, 325 mg, Oral, Daily, Collins, Emma M, PA-C, 325 mg at 10/07/19 0842 °•  atorvastatin (LIPITOR) tablet 80 mg, 80 mg, Oral, Daily,  Collins, Emma M, PA-C, 80 mg at 10/07/19 0843 °•  bisacodyl (DULCOLAX) EC tablet 5 mg, 5 mg, Oral, Daily PRN, Collins, Emma M, PA-C °•  Chlorhexidine Gluconate Cloth 2 % PADS 6 each, 6 each, Topical, Daily, Raines, Alexander N, MD, 6 each at 10/07/19 0843 °•  clevidipine (CLEVIPREX) infusion 0.5 mg/mL, 1 mg/hr, Intravenous, Continuous, Collins, Emma M, PA-C, Stopped at 10/05/19 0654 °•  clopidogrel (PLAVIX) tablet 75 mg, 75 mg, Oral, Daily, Watson, Julia, MD, 75 mg at 10/07/19 0842 °•  diazepam (VALIUM) tablet 2 mg, 2 mg, Oral, q12n4p, Collins, Emma M, PA-C, 2 mg at 10/07/19 0842 °•  docusate sodium (COLACE) capsule 100 mg, 100 mg, Oral, Daily, Collins, Emma M, PA-C, 100 mg at 10/07/19 0843 °•  FLUoxetine (PROZAC) capsule 10 mg, 10 mg, Oral, Daily, Collins, Emma M, PA-C, 10 mg at 10/07/19 0842 °•  guaiFENesin-dextromethorphan (ROBITUSSIN DM) 100-10 MG/5ML syrup 15 mL, 15 mL, Oral, Q4H PRN, Collins, Emma M, PA-C, 15 mL at 10/06/19 2115 °•  hydrALAZINE (APRESOLINE) injection 5 mg, 5 mg, Intravenous, Q20 Min PRN, Collins, Emma M, PA-C °•  HYDROmorphone (DILAUDID) injection 0.5-1 mg, 0.5-1 mg, Intravenous, Q2H PRN, Collins, Emma M, PA-C °•  labetalol (NORMODYNE) injection 10 mg, 10 mg, Intravenous, Q10 min PRN, Collins, Emma M, PA-C °•  magnesium sulfate IVPB 2 g 50 mL, 2 g, Intravenous, Daily PRN, Collins, Emma M, PA-C °•  metoprolol tartrate (LOPRESSOR) injection 2-5 mg, 2-5 mg, Intravenous, Q2H PRN, Collins, Emma M, PA-C °•  mupirocin ointment (BACTROBAN) 2 % 1 application, 1 application, Nasal, BID, Collins, Emma M, PA-C, 1 application at 10/07/19 0843 °•  ondansetron (ZOFRAN) injection 4 mg, 4 mg, Intravenous, Q6H PRN, Collins, Emma M, PA-C °•  oxyCODONE (Oxy IR/ROXICODONE) immediate release tablet 5-10 mg, 5-10 mg, Oral, Q4H PRN, Collins, Emma M, PA-C °•  pantoprazole (PROTONIX) EC tablet 40 mg, 40 mg, Oral, Daily, Collins, Emma M, PA-C, 40 mg at 10/07/19 0842 °•  phenol (CHLORASEPTIC) mouth spray 1 spray, 1  spray, Mouth/Throat, PRN, Collins, Emma M, PA-C °•  potassium chloride SA (KLOR-CON) CR tablet 20-40 mEq, 20-40 mEq, Oral, Daily PRN, Collins, Emma M, PA-C °•  propranolol (INDERAL) tablet 60 mg, 60 mg, Oral, BID, Watson, Julia, MD, 60 mg at 10/07/19 0845 °•  senna-docusate (Senokot-S) tablet 1 tablet, 1 tablet, Oral, QHS PRN, Collins, Emma M, PA-C ° °Patients Current Diet:  °Diet Order   °       °  DIET DYS 3 Room service appropriate? Yes with Assist; Fluid consistency: Thin  Diet effective now       °  °  °  °  ° ° °Precautions / Restrictions °Precautions °Precautions: Fall °Restrictions °Weight Bearing Restrictions: No  ° °Has   the patient had 2 or more falls or a fall with injury in the past year?Yes ° °Prior Activity Level °Limited Community (1-2x/wk): family took to appointments, errands, no DME used at baseline but pt has fallen ° °Prior Functional Level °Prior Function °Level of Independence: Needs assistance °Gait / Transfers Assistance Needed: in past year fell due to slip on kitchen floor; otherwise independent °ADL's / Homemaking Assistance Needed: Assist from family for appts, driving, grocery shopping and sometimes medications  ° °Self Care: Did the patient need help bathing, dressing, using the toilet or eating? ° Independent ° °Indoor Mobility: Did the patient need assistance with walking from room to room (with or without device)? Independent ° °Stairs: Did the patient need assistance with internal or external stairs (with or without device)? Independent ° °Functional Cognition: Did the patient need help planning regular tasks such as shopping or remembering to take medications? Independent ° °Home Assistive Devices / Equipment °Home Equipment: Cane - single point, Walker - 2 wheels, Shower seat - built in ° °Prior Device Use: Indicate devices/aids used by the patient prior to current illness, exacerbation or injury? none ° °Current Functional Level °Cognition ° Arousal/Alertness:  Awake/alert °Overall Cognitive Status: Impaired/Different from baseline °Orientation Level: Oriented X4 °Safety/Judgement: Decreased awareness of safety, Decreased awareness of deficits °General Comments: better safety awareness (agrees to stay for rehab) °Attention: Sustained °Sustained Attention: Appears intact °Memory: Impaired °Memory Impairment: Retrieval deficit, Decreased recall of new information, Decreased short term memory °Decreased Short Term Memory: Verbal basic, Functional basic °Awareness: Impaired °Awareness Impairment: Intellectual impairment °Problem Solving: Impaired °Problem Solving Impairment: Verbal basic, Functional basic °Executive Function: Decision Making °Decision Making: Impaired °Decision Making Impairment: Verbal basic, Functional basic °Behaviors: Impulsive °Safety/Judgment: Impaired °   °Extremity Assessment °(includes Sensation/Coordination) ° Upper Extremity Assessment: RUE deficits/detail °RUE:  (Noted tone to ROM at elbow/shoulder. R ring finger trigger) °RUE Sensation: WNL °RUE Coordination: decreased fine motor, decreased gross motor  °Lower Extremity Assessment: RLE deficits/detail °RLE Deficits / Details: pt able to isolate movements all joints; dorsiflexion results in supination; hip flexion able to raise leg off bed, knee extension grossly 4/5, ankle DF 4/5 °RLE Sensation: WNL (pt denies changes)  °  °ADLs ° Overall ADL's : Needs assistance/impaired °Grooming: Wash/dry face, Minimal assistance °Upper Body Dressing : Moderate assistance, Cueing for sequencing, Cueing for compensatory techniques °Lower Body Dressing: Maximal assistance °Toilet Transfer: Maximal assistance  °  °Mobility ° Overal bed mobility: Needs Assistance °Bed Mobility: Supine to Sit °Rolling: Min assist (to L side) °Sidelying to sit: Mod assist °Supine to sit: Mod assist, HOB elevated °Sit to supine: Min assist °General bed mobility comments: assist with BLE and to elevate trunk, use of bed pad to scoot to  EOB  °  °Transfers ° Overall transfer level: Needs assistance °Equipment used: 2 person hand held assist °Transfers: Sit to/from Stand, Stand Pivot Transfers °Sit to Stand: Mod assist, +2 physical assistance °Stand pivot transfers: +2 physical assistance, Mod assist °General transfer comment: standing trial x 2. Pt demostrates very flexed posture, unable to correct with cues/assist hindering ability to progress gait. SPT performed bed to recliner toward left.  °  °Ambulation / Gait / Stairs / Wheelchair Mobility ° Ambulation/Gait °General Gait Details: shuffle, pivotal steps toward left for transfer to recliner  °  °Posture / Balance Dynamic Sitting Balance °Sitting balance - Comments: assist to maintain balance °Balance °Overall balance assessment: Needs assistance °Sitting-balance support: Single extremity supported, Feet unsupported °Sitting balance-Leahy Scale: Poor °Sitting balance - Comments:   assist to maintain balance Postural control: Posterior lean Standing balance support: Bilateral upper extremity supported, During functional activity Standing balance-Leahy Scale: Poor Standing balance comment: reliant on external support    Special needs/care consideration Designated visitor son Jeneen Rinks     Previous Environmental health practitioner (from acute therapy documentation) Living Arrangements: Alone  Lives With: Family Available Help at Discharge: Family, Friend(s), Available 24 hours/day Type of Home: House Home Layout: One level Home Access: Stairs to enter Entrance Stairs-Rails: Right Entrance Stairs-Number of Steps: 1 Bathroom Shower/Tub: Walk-in shower Additional Comments: pt reports equipment was her mothers (?reliability)  Discharge Living Setting Plans for Discharge Living Setting: Lives with (comment) (son and daughter in law) Type of Home at Discharge: House Discharge Home Layout: One level Discharge Home Access: Level entry (level through the back entrance) Discharge Bathroom Shower/Tub:  Walk-in shower Discharge Bathroom Toilet: Standard Discharge Bathroom Accessibility: Yes How Accessible: Accessible via walker Does the patient have any problems obtaining your medications?: No  Social/Family/Support Systems Anticipated Caregiver: Kennia Vanvorst (son) Anticipated Caregiver's Contact Information: [phone] Ability/Limitations of Caregiver: n/a Caregiver Availability: 24/7 Discharge Plan Discussed with Primary Caregiver: Yes Is Caregiver In Agreement with Plan?: Yes Does Caregiver/Family have Issues with Lodging/Transportation while Pt is in Rehab?: No   Goals Patient/Family Goal for Rehab: PT/OT/SLP supervision Expected length of stay: 12-15 days Additional Information: Pt's son at bedside and states that he plans to move pt home with he and his wife after rehab stay.  They are retired and can provide 24/7 assist if needed. Pt/Family Agrees to Admission and willing to participate: Yes Program Orientation Provided & Reviewed with Pt/Caregiver Including Roles  & Responsibilities: Yes   Decrease burden of Care through IP rehab admission: n/a   Possible need for SNF placement upon discharge: Not anticipated. Plan to go home with her son and his wife who are retired and can provide adequate support.    Patient Condition: I have reviewed medical records from [facility], spoken with CM, and patient and son. I met with patient at the bedside for inpatient rehabilitation assessment.  Patient will benefit from ongoing PT, OT and SLP, can actively participate in 3 hours of therapy a day 5 days of the week, and can make measurable gains during the admission.  Patient will also benefit from the coordinated team approach during an Inpatient Acute Rehabilitation admission.  The patient will receive intensive therapy as well as Rehabilitation physician, nursing, social worker, and care management interventions.  Due to safety, skin/wound care, disease management, medication  administration, pain management and patient education the patient requires 24 hour a day rehabilitation nursing.  The patient is currently mod +2 with mobility and basic ADLs.  Discharge setting and therapy post discharge at home with home health is anticipated.  Patient has agreed to participate in the Acute Inpatient Rehabilitation Program and will admit today.  Preadmission Screen Completed UU:[id number] Lannie Fields, PT, 10/07/2019 10:57 AM ______________________________________________________________________   Discussed status with Dr. Naaman Plummer on 10/07/19 at 10:57 AM  and received approval for admission today.   Admission Coordinator:   Michel Santee, PT, DPT time 10:57 AM Sudie Grumbling 10/07/19

## 2019-10-03 NOTE — H&P (View-Only) (Signed)
86-year-old female that was seen in consultation on Tuesday by Dr. Dickson for symptomatic left carotid stenosis with left hemispheric event.  Ultimately after initial evaluation and recommending carotid endarterectomy patient was not interested in surgery and Dr. Dickson signed off.  We were contacted today that after meeting with rehabilitation the patient wanted to reconsider surgery.  I discussed with both the patient and her son at bedside who both agree to proceed with left carotid endarterectomy.  Plan is surgery tomorrow and please have the patient n.p.o. after midnight.  I discussed risk and benefits including cranial nerve injury, bleeding sometimes requiring return to the OR, infection, risk of anesthesia, 1% risk of perioperative stroke.  We discussed goal of surgery is ultimately stroke risk reduction.  I did review her CTA neck and MRI brain.  She presented with right sided weakness and feels she is at her baseline since that event.  Very functional prior to this event.  Elizabeth Tapia J. Elizabeth Winkles, MD [facility] [place] Office: [phone]   Elizabeth Tapia J Elizabeth Tapia    

## 2019-10-03 NOTE — Progress Notes (Signed)
Inpatient Rehab Admissions:  Inpatient Rehab Consult received.  I met with patient and her son, Jeneen Rinks, at the bedside for rehabilitation assessment and to discuss goals and expectations of an inpatient rehab admission.  Per Jeneen Rinks, they are agreeable to CIR for pt for rehab, and pt nods as well.  We discussed possibility of pt admitting to CIR today, if medically cleared.  Jeneen Rinks does mention that they are perhaps considering the CEA, which would need to be done before admission to CIR.  We discussed timeline for both, and son would like to speak to medical team again.  I spoke to Dr. Konrad Penta to let her know and they will follow up with the family.   Signed: Shann Medal, PT, DPT Admissions Coordinator [phone] 10/03/19  10:41 AM

## 2019-10-03 NOTE — Anesthesia Preprocedure Evaluation (Addendum)
Anesthesia Evaluation  Patient identified by MRN, date of birth, ID band Patient awake    Reviewed: Allergy & Precautions, NPO status , Patient's Chart, lab work & pertinent test results, reviewed documented beta blocker date and time   Airway Mallampati: II  TM Distance: >3 FB Neck ROM: Full    Dental  (+) Dental Advisory Given, Teeth Intact   Pulmonary neg pulmonary ROS,    Pulmonary exam normal breath sounds clear to auscultation       Cardiovascular hypertension, Pt. on home beta blockers + CAD  Normal cardiovascular exam Rhythm:Regular Rate:Normal     Neuro/Psych PSYCHIATRIC DISORDERS Anxiety Depression CVA    GI/Hepatic negative GI ROS, Neg liver ROS,   Endo/Other  negative endocrine ROS  Renal/GU Renal disease     Musculoskeletal negative musculoskeletal ROS (+)   Abdominal   Peds  Hematology negative hematology ROS (+)   Anesthesia Other Findings   Reproductive/Obstetrics                            Anesthesia Physical Anesthesia Plan  ASA: III  Anesthesia Plan: General   Post-op Pain Management:    Induction: Intravenous  PONV Risk Score and Plan: 3 and Ondansetron, Dexamethasone and Treatment may vary due to age or medical condition  Airway Management Planned: Oral ETT  Additional Equipment: Arterial line  Intra-op Plan:   Post-operative Plan: Possible Post-op intubation/ventilation  Informed Consent: I have reviewed the patients History and Physical, chart, labs and discussed the procedure including the risks, benefits and alternatives for the proposed anesthesia with the patient or authorized representative who has indicated his/her understanding and acceptance.   Patient has DNR.  Discussed DNR with patient and Suspend DNR.   Dental advisory given  Plan Discussed with: CRNA  Anesthesia Plan Comments: (Called son this AM, but no answer. Dr. Edilia Bo adamant the  patient is able to provide consent because her issues are dysarthria no cognitive. Dr. Edilia Bo and I both discussed her DNR status and she wishes to rescind the DNR for the procedure. )       Anesthesia Quick Evaluation

## 2019-10-03 NOTE — Progress Notes (Signed)
Inpatient Rehab Admissions Coordinator:   Spoke to Dr. Laddie Aquas who states pt agreeable to CEA, which is planned for tomorrow.  Will f/u with patient afterwards.   Estill Dooms, PT, DPT Admissions Coordinator [phone] 10/03/19  11:55 AM

## 2019-10-04 ENCOUNTER — Inpatient Hospital Stay (HOSPITAL_COMMUNITY): Payer: Medicare Other | Admitting: Anesthesiology

## 2019-10-04 ENCOUNTER — Encounter (HOSPITAL_COMMUNITY)
Admission: EM | Disposition: A | Discharge status: Rehabilitation Facility | Payer: Self-pay | Source: Home / Self Care | Attending: Internal Medicine

## 2019-10-04 DIAGNOSIS — I6522 Occlusion and stenosis of left carotid artery: Secondary | ICD-10-CM

## 2019-10-04 DIAGNOSIS — L8990 Pressure ulcer of unspecified site, unspecified stage: Secondary | ICD-10-CM | POA: Insufficient documentation

## 2019-10-04 HISTORY — PX: ENDARTERECTOMY: SHX5162

## 2019-10-04 LAB — BASIC METABOLIC PANEL
Anion gap: 10 (ref 5–15)
BUN: 19 mg/dL (ref 8–23)
CO2: 25 mmol/L (ref 22–32)
Calcium: 9.5 mg/dL (ref 8.9–10.3)
Chloride: 100 mmol/L (ref 98–111)
Creatinine, Ser: 1.24 mg/dL — ABNORMAL HIGH (ref 0.44–1.00)
GFR calc Af Amer: 45 mL/min — ABNORMAL LOW (ref >60)
GFR calc non Af Amer: 39 mL/min — ABNORMAL LOW (ref >60)
Glucose, Bld: 100 mg/dL — ABNORMAL HIGH (ref 70–99)
Potassium: 4.2 mmol/L (ref 3.5–5.1)
Sodium: 135 mmol/L (ref 135–145)

## 2019-10-04 LAB — CBC
HCT: 35.4 % — ABNORMAL LOW (ref 36.0–46.0)
Hemoglobin: 11.2 g/dL — ABNORMAL LOW (ref 12.0–15.0)
MCH: 29.1 pg (ref 26.0–34.0)
MCHC: 31.6 g/dL (ref 30.0–36.0)
MCV: 91.9 fL (ref 80.0–100.0)
Platelets: 375 10*3/uL (ref 150–400)
RBC: 3.85 MIL/uL — ABNORMAL LOW (ref 3.87–5.11)
RDW: 13.6 % (ref 11.5–15.5)
WBC: 7.7 10*3/uL (ref 4.0–10.5)
nRBC: 0 % (ref 0.0–0.2)

## 2019-10-04 LAB — POCT ACTIVATED CLOTTING TIME
Activated Clotting Time: 219 seconds
Activated Clotting Time: 230 seconds

## 2019-10-04 LAB — SURGICAL PCR SCREEN
MRSA, PCR: NEGATIVE
Staphylococcus aureus: NEGATIVE

## 2019-10-04 SURGERY — ENDARTERECTOMY, CAROTID
Anesthesia: General | Site: Neck | Laterality: Left

## 2019-10-04 MED ORDER — MAGNESIUM SULFATE 2 GM/50ML IV SOLN: 2.0000 g | Freq: Every day | INTRAVENOUS | Status: DC | PRN

## 2019-10-04 MED ORDER — HYDRALAZINE HCL 20 MG/ML IJ SOLN: 5.0000 mg | INTRAMUSCULAR | Status: DC | PRN

## 2019-10-04 MED ORDER — CLEVIDIPINE BUTYRATE 0.5 MG/ML IV EMUL
1.0000 mg/h | INTRAVENOUS | Status: DC
  Filled 2019-10-04: qty 100

## 2019-10-04 MED ORDER — FENTANYL CITRATE (PF) 100 MCG/2ML IJ SOLN: 25.0000 ug | INTRAMUSCULAR | Status: DC | PRN

## 2019-10-04 MED ORDER — SODIUM CHLORIDE 0.9 % IV SOLN: INTRAVENOUS | Status: DC

## 2019-10-04 MED ORDER — HYDROMORPHONE HCL 1 MG/ML IJ SOLN: 0.5000 mg | INTRAMUSCULAR | Status: DC | PRN

## 2019-10-04 MED ORDER — DEXAMETHASONE SODIUM PHOSPHATE 10 MG/ML IJ SOLN
INTRAMUSCULAR | Status: DC | PRN
  Administered 2019-10-04: 4 mg via INTRAVENOUS

## 2019-10-04 MED ORDER — CEFAZOLIN SODIUM 1 G IJ SOLR
INTRAMUSCULAR | Status: AC
  Filled 2019-10-04: qty 20

## 2019-10-04 MED ORDER — FENTANYL CITRATE (PF) 250 MCG/5ML IJ SOLN
INTRAMUSCULAR | Status: AC
  Filled 2019-10-04: qty 5

## 2019-10-04 MED ORDER — DEXTRAN 40 IN SALINE 10-0.9 % IV SOLN
INTRAVENOUS | Status: AC | PRN
  Administered 2019-10-04: 500 mL

## 2019-10-04 MED ORDER — ASPIRIN EC 325 MG PO TBEC
325.0000 mg | DELAYED_RELEASE_TABLET | Freq: Every day | ORAL | Status: DC
  Administered 2019-10-04 – 2019-10-07 (×4): 325 mg via ORAL
  Filled 2019-10-04 (×4): qty 1

## 2019-10-04 MED ORDER — SODIUM CHLORIDE 0.9 % IV SOLN: 500.0000 mL | Freq: Once | INTRAVENOUS | Status: DC | PRN

## 2019-10-04 MED ORDER — PROPOFOL 10 MG/ML IV BOLUS
INTRAVENOUS | Status: AC
  Filled 2019-10-04: qty 20

## 2019-10-04 MED ORDER — HEPARIN SODIUM (PORCINE) 1000 UNIT/ML IJ SOLN
INTRAMUSCULAR | Status: DC | PRN
  Administered 2019-10-04: 1000 [IU] via INTRAVENOUS
  Administered 2019-10-04: 4500 [IU] via INTRAVENOUS

## 2019-10-04 MED ORDER — ESMOLOL HCL 100 MG/10ML IV SOLN
INTRAVENOUS | Status: AC
  Filled 2019-10-04: qty 10

## 2019-10-04 MED ORDER — PROTAMINE SULFATE 10 MG/ML IV SOLN
INTRAVENOUS | Status: DC | PRN
  Administered 2019-10-04: 30 mg via INTRAVENOUS

## 2019-10-04 MED ORDER — METOPROLOL TARTRATE 5 MG/5ML IV SOLN: 2.0000 mg | INTRAVENOUS | Status: DC | PRN

## 2019-10-04 MED ORDER — LABETALOL HCL 5 MG/ML IV SOLN: 10.0000 mg | INTRAVENOUS | Status: DC | PRN

## 2019-10-04 MED ORDER — ONDANSETRON HCL 4 MG/2ML IJ SOLN: 4.0000 mg | Freq: Four times a day (QID) | INTRAMUSCULAR | Status: DC | PRN

## 2019-10-04 MED ORDER — LIDOCAINE HCL (PF) 1 % IJ SOLN
INTRAMUSCULAR | Status: DC | PRN
  Administered 2019-10-04: 10 mL

## 2019-10-04 MED ORDER — LACTATED RINGERS IV SOLN: INTRAVENOUS | Status: DC | PRN

## 2019-10-04 MED ORDER — ONDANSETRON HCL 4 MG/2ML IJ SOLN: 4.0000 mg | Freq: Once | INTRAMUSCULAR | Status: DC | PRN

## 2019-10-04 MED ORDER — BISACODYL 5 MG PO TBEC: 5.0000 mg | DELAYED_RELEASE_TABLET | Freq: Every day | ORAL | Status: DC | PRN

## 2019-10-04 MED ORDER — CHLORHEXIDINE GLUCONATE CLOTH 2 % EX PADS
6.0000 | MEDICATED_PAD | Freq: Every day | CUTANEOUS | Status: DC
  Administered 2019-10-04 – 2019-10-07 (×4): 6 via TOPICAL

## 2019-10-04 MED ORDER — AMISULPRIDE (ANTIEMETIC) 5 MG/2ML IV SOLN
10.0000 mg | Freq: Once | INTRAVENOUS | Status: DC
  Administered 2019-10-04: 10 mg via INTRAVENOUS

## 2019-10-04 MED ORDER — FENTANYL CITRATE (PF) 100 MCG/2ML IJ SOLN
INTRAMUSCULAR | Status: DC | PRN
Start: 2019-10-04 — End: 2019-10-04
  Administered 2019-10-04: 50 ug via INTRAVENOUS
  Administered 2019-10-04: 25 ug via INTRAVENOUS

## 2019-10-04 MED ORDER — CEFAZOLIN SODIUM-DEXTROSE 2-3 GM-%(50ML) IV SOLR
INTRAVENOUS | Status: DC | PRN
  Administered 2019-10-04: 2 g via INTRAVENOUS

## 2019-10-04 MED ORDER — PROPOFOL 10 MG/ML IV BOLUS
INTRAVENOUS | Status: DC | PRN
  Administered 2019-10-04: 60 mg via INTRAVENOUS
  Administered 2019-10-04: 20 mg via INTRAVENOUS

## 2019-10-04 MED ORDER — OXYCODONE HCL 5 MG PO TABS: 5.0000 mg | ORAL_TABLET | ORAL | Status: DC | PRN

## 2019-10-04 MED ORDER — AMISULPRIDE (ANTIEMETIC) 5 MG/2ML IV SOLN
INTRAVENOUS | Status: AC
  Filled 2019-10-04: qty 4

## 2019-10-04 MED ORDER — 0.9 % SODIUM CHLORIDE (POUR BTL) OPTIME
TOPICAL | Status: DC | PRN
  Administered 2019-10-04: 1000 mL

## 2019-10-04 MED ORDER — PHENYLEPHRINE 40 MCG/ML (10ML) SYRINGE FOR IV PUSH (FOR BLOOD PRESSURE SUPPORT)
PREFILLED_SYRINGE | INTRAVENOUS | Status: DC | PRN
  Administered 2019-10-04: 40 ug via INTRAVENOUS
  Administered 2019-10-04: 80 ug via INTRAVENOUS

## 2019-10-04 MED ORDER — HEPARIN SODIUM (PORCINE) 1000 UNIT/ML IJ SOLN
INTRAMUSCULAR | Status: AC
  Filled 2019-10-04: qty 1

## 2019-10-04 MED ORDER — PROTAMINE SULFATE 10 MG/ML IV SOLN
INTRAVENOUS | Status: AC
  Filled 2019-10-04: qty 5

## 2019-10-04 MED ORDER — GUAIFENESIN-DM 100-10 MG/5ML PO SYRP
15.0000 mL | ORAL_SOLUTION | ORAL | Status: DC | PRN
  Administered 2019-10-06: 15 mL via ORAL
  Filled 2019-10-04: qty 15

## 2019-10-04 MED ORDER — CLEVIDIPINE BUTYRATE 0.5 MG/ML IV EMUL
INTRAVENOUS | Status: DC | PRN
  Administered 2019-10-04: 2 mg/h via INTRAVENOUS

## 2019-10-04 MED ORDER — SODIUM CHLORIDE 0.9 % IV SOLN
INTRAVENOUS | Status: DC | PRN
  Administered 2019-10-04: 500 mL

## 2019-10-04 MED ORDER — ROCURONIUM BROMIDE 10 MG/ML (PF) SYRINGE
PREFILLED_SYRINGE | INTRAVENOUS | Status: DC | PRN
  Administered 2019-10-04: 30 mg via INTRAVENOUS
  Administered 2019-10-04: 10 mg via INTRAVENOUS

## 2019-10-04 MED ORDER — PHENOL 1.4 % MT LIQD: 1.0000 | OROMUCOSAL | Status: DC | PRN

## 2019-10-04 MED ORDER — PHENYLEPHRINE HCL-NACL 10-0.9 MG/250ML-% IV SOLN
INTRAVENOUS | Status: DC | PRN
  Administered 2019-10-04: 75 ug/min via INTRAVENOUS

## 2019-10-04 MED ORDER — ALUM & MAG HYDROXIDE-SIMETH 200-200-20 MG/5ML PO SUSP: 15.0000 mL | ORAL | Status: DC | PRN

## 2019-10-04 MED ORDER — LIDOCAINE 2% (20 MG/ML) 5 ML SYRINGE
INTRAMUSCULAR | Status: DC | PRN
  Administered 2019-10-04: 40 mg via INTRAVENOUS

## 2019-10-04 MED ORDER — SUGAMMADEX SODIUM 200 MG/2ML IV SOLN
INTRAVENOUS | Status: DC | PRN
  Administered 2019-10-04: 80 mg via INTRAVENOUS

## 2019-10-04 MED ORDER — ONDANSETRON HCL 4 MG/2ML IJ SOLN
INTRAMUSCULAR | Status: DC | PRN
  Administered 2019-10-04 (×2): 4 mg via INTRAVENOUS

## 2019-10-04 MED ORDER — EPHEDRINE SULFATE-NACL 50-0.9 MG/10ML-% IV SOSY
PREFILLED_SYRINGE | INTRAVENOUS | Status: DC | PRN
  Administered 2019-10-04 (×3): 5 mg via INTRAVENOUS

## 2019-10-04 MED ORDER — PANTOPRAZOLE SODIUM 40 MG PO TBEC
40.0000 mg | DELAYED_RELEASE_TABLET | Freq: Every day | ORAL | Status: DC
  Administered 2019-10-04 – 2019-10-07 (×4): 40 mg via ORAL
  Filled 2019-10-04 (×3): qty 1

## 2019-10-04 MED ORDER — POTASSIUM CHLORIDE CRYS ER 20 MEQ PO TBCR: 20.0000 meq | EXTENDED_RELEASE_TABLET | Freq: Every day | ORAL | Status: DC | PRN

## 2019-10-04 MED ORDER — DOCUSATE SODIUM 100 MG PO CAPS
100.0000 mg | ORAL_CAPSULE | Freq: Every day | ORAL | Status: DC
  Administered 2019-10-05 – 2019-10-07 (×3): 100 mg via ORAL
  Filled 2019-10-04 (×3): qty 1

## 2019-10-04 MED ORDER — CEFAZOLIN SODIUM-DEXTROSE 1-4 GM/50ML-% IV SOLN
1.0000 g | Freq: Two times a day (BID) | INTRAVENOUS | Status: AC
  Administered 2019-10-04 – 2019-10-05 (×2): 1 g via INTRAVENOUS
  Filled 2019-10-04 (×2): qty 50

## 2019-10-04 SURGICAL SUPPLY — 45 items
ADH SKN CLS APL DERMABOND .7 (GAUZE/BANDAGES/DRESSINGS) ×1
BAG DECANTER FOR FLEXI CONT (MISCELLANEOUS) ×3 IMPLANT
CANISTER SUCT 3000ML PPV (MISCELLANEOUS) ×3 IMPLANT
CANNULA VESSEL 3MM 2 BLNT TIP (CANNULA) ×9 IMPLANT
CATH ROBINSON RED A/P 18FR (CATHETERS) ×3 IMPLANT
CLIP VESOCCLUDE MED 24/CT (CLIP) ×3 IMPLANT
CLIP VESOCCLUDE SM WIDE 24/CT (CLIP) ×3 IMPLANT
COVER WAND RF STERILE (DRAPES) ×3 IMPLANT
DERMABOND ADVANCED (GAUZE/BANDAGES/DRESSINGS) ×2
DERMABOND ADVANCED .7 DNX12 (GAUZE/BANDAGES/DRESSINGS) ×1 IMPLANT
DRAIN CHANNEL 15F RND FF W/TCR (WOUND CARE) IMPLANT
ELECT REM PT RETURN 9FT ADLT (ELECTROSURGICAL) ×3
ELECTRODE REM PT RTRN 9FT ADLT (ELECTROSURGICAL) ×1 IMPLANT
EVACUATOR SILICONE 100CC (DRAIN) IMPLANT
GLOVE BIO SURGEON STRL SZ7.5 (GLOVE) ×3 IMPLANT
GLOVE BIOGEL PI IND STRL 8 (GLOVE) ×1 IMPLANT
GLOVE BIOGEL PI INDICATOR 8 (GLOVE) ×2
GOWN STRL REUS W/ TWL LRG LVL3 (GOWN DISPOSABLE) ×3 IMPLANT
GOWN STRL REUS W/TWL LRG LVL3 (GOWN DISPOSABLE) ×9
KIT BASIN OR (CUSTOM PROCEDURE TRAY) ×3 IMPLANT
KIT SHUNT ARGYLE CAROTID ART 6 (VASCULAR PRODUCTS) IMPLANT
KIT TURNOVER KIT B (KITS) ×3 IMPLANT
NDL HYPO 25GX1X1/2 BEV (NEEDLE) ×1 IMPLANT
NEEDLE HYPO 25GX1X1/2 BEV (NEEDLE) ×3 IMPLANT
NS IRRIG 1000ML POUR BTL (IV SOLUTION) ×6 IMPLANT
PACK CAROTID (CUSTOM PROCEDURE TRAY) ×3 IMPLANT
PAD ARMBOARD 7.5X6 YLW CONV (MISCELLANEOUS) ×6 IMPLANT
PATCH VASC XENOSURE 1CMX6CM (Vascular Products) ×3 IMPLANT
PATCH VASC XENOSURE 1X6 (Vascular Products) IMPLANT
POSITIONER HEAD DONUT 9IN (MISCELLANEOUS) ×3 IMPLANT
SET WALTER ACTIVATION W/DRAPE (SET/KITS/TRAYS/PACK) IMPLANT
SHUNT CAROTID BYPASS 10 (VASCULAR PRODUCTS) ×2 IMPLANT
SHUNT CAROTID BYPASS 12 (VASCULAR PRODUCTS) IMPLANT
SPONGE SURGIFOAM ABS GEL 100 (HEMOSTASIS) IMPLANT
SUT PROLENE 6 0 BV (SUTURE) ×6 IMPLANT
SUT SILK 2 0 PERMA HAND 18 BK (SUTURE) IMPLANT
SUT SILK 3 0 (SUTURE) ×3
SUT SILK 3-0 18XBRD TIE 12 (SUTURE) IMPLANT
SUT VIC AB 3-0 SH 27 (SUTURE) ×3
SUT VIC AB 3-0 SH 27X BRD (SUTURE) ×1 IMPLANT
SUT VICRYL 4-0 PS2 18IN ABS (SUTURE) ×3 IMPLANT
SYR 20ML LL LF (SYRINGE) ×3 IMPLANT
SYR CONTROL 10ML LL (SYRINGE) ×3 IMPLANT
TOWEL GREEN STERILE (TOWEL DISPOSABLE) ×3 IMPLANT
WATER STERILE IRR 1000ML POUR (IV SOLUTION) ×3 IMPLANT

## 2019-10-04 NOTE — Transfer of Care (Signed)
Immediate Anesthesia Transfer of Care Note  Patient: Elizabeth Tapia  Procedure(s) Performed: ENDARTERECTOMY CAROTID (Left Neck)  Patient Location: PACU  Anesthesia Type:General  Level of Consciousness: awake, alert  and oriented  Airway & Oxygen Therapy: Patient Spontanous Breathing and Patient connected to nasal cannula oxygen  Post-op Assessment: Report given to RN, Post -op Vital signs reviewed and stable and Patient moving all extremities  Post vital signs: Reviewed and stable  Last Vitals:  Vitals Value Taken Time  BP 128/59 10/04/19 1010  Temp    Pulse 99 10/04/19 1011  Resp 12 10/04/19 1011  SpO2 95 % 10/04/19 1011  Vitals shown include unvalidated device data.  Last Pain:  Vitals:   10/04/19 0303  TempSrc: Oral  PainSc:          Complications: No complications documented. Baseline right sided weakness as per preop. Pt c/o nausea despite additional IV zofran 4 mg given. Dr. Renold Don notified.

## 2019-10-04 NOTE — Anesthesia Procedure Notes (Signed)
Arterial Line Insertion Start/End10/01/2019 7:05 AM, 10/04/2019 7:15 AM Performed by: Jodell Cipro, CRNA  Patient location: Pre-op. Lidocaine 1% used for infiltration Left, radial was placed Catheter size: 20 G  Procedure performed without using ultrasound guided technique. Following insertion, dressing applied and Biopatch. Post procedure assessment: normal  Patient tolerated the procedure well with no immediate complications.

## 2019-10-04 NOTE — Interval H&P Note (Signed)
History and Physical Interval Note:  10/04/2019 7:05 AM  Elizabeth Tapia  has presented today for surgery, with the diagnosis of LEFT CAROTID ARTERY STENOSIS.  The various methods of treatment have been discussed with the patient and family. After consideration of risks, benefits and other options for treatment, the patient has consented to  Procedure(s): ENDARTERECTOMY CAROTID (Left) as a surgical intervention.  The patient's history has been reviewed, patient examined, no change in status, stable for surgery.  I have reviewed the patient's chart and labs.  Questions were answered to the patient's satisfaction.     Waverly Ferrari

## 2019-10-04 NOTE — Progress Notes (Signed)
PHARMACY NOTE:  ANTIMICROBIAL RENAL DOSAGE ADJUSTMENT  Current antimicrobial regimen includes a mismatch between antimicrobial dosage and estimated renal function.  As per policy approved by the Pharmacy & Therapeutics and Medical Executive Committees, the antimicrobial dosage will be adjusted accordingly.  Current antimicrobial dosage:  Cefazolin 2g IV every 8 hours x 2 doses  Indication: Post-op coverage  Renal Function:  Estimated Creatinine Clearance: 20.6 mL/min (A) (by C-G formula based on SCr of 1.24 mg/dL (H)). []      On intermittent HD, scheduled: []      On CRRT    Antimicrobial dosage has been changed to:  Cefazolin 1g IV every 12 hours x 2 doses  Additional comments:  Thank you for allowing pharmacy to be a part of this patient's care.  , PharmD, BCPS Clinical Pharmacist Clinical phone for 10/04/2019: Georgina Pillion 10/04/2019 12:44 PM   **Pharmacist phone directory can now be found on [email or web address] ([email or web address]).  Listed under [facility].

## 2019-10-04 NOTE — Progress Notes (Signed)
Patient for Left endarterectomy this morning .  Report called.  CHG bath given.  Consent signed.  Pre- procedure checklist done.  Patient transferred in bed  to short stay accompanied by RN.

## 2019-10-04 NOTE — Anesthesia Procedure Notes (Signed)
Procedure Name: Intubation Date/Time: 10/04/2019 7:41 AM Performed by: Leonor Liv, CRNA Pre-anesthesia Checklist: Patient identified, Emergency Drugs available, Suction available and Patient being monitored Patient Re-evaluated:Patient Re-evaluated prior to induction Oxygen Delivery Method: Circle System Utilized Preoxygenation: Pre-oxygenation with 100% oxygen Induction Type: IV induction Ventilation: Mask ventilation without difficulty Laryngoscope Size: Mac and 3 Grade View: Grade I Tube type: Oral Tube size: 7.0 mm Number of attempts: 1 Airway Equipment and Method: Stylet Placement Confirmation: ETT inserted through vocal cords under direct vision,  positive ETCO2 and breath sounds checked- equal and bilateral Secured at: 21 cm Tube secured with: Tape Dental Injury: Teeth and Oropharynx as per pre-operative assessment

## 2019-10-04 NOTE — Progress Notes (Signed)
Subjective:  Patient interviewed at bedside after L carotid endarterectomy.  Her voice is very faint, so communication relied heavily on lip-reading.  [name] reports that she is doing "very well" after surgery today. She remains frustrated that she cannot speak. States "I like to talk" and "I don't know what I would do if I couldn't talk." Her R arm still feels very heavy, unchanged from yesterday. She has no new complaints today.   Objective:  Vital signs in last 24 hours: Vitals:   10/04/19 1200 10/04/19 1228 10/04/19 1300 10/04/19 1400  BP: 132/68 (!) 124/48 (!) 122/53 (!) 124/48  Pulse: 79 72 70 72  Resp: 16 16 15 16   Temp:  98.1 F (36.7 C)    TempSrc:      SpO2: 97% 97% 96% 97%  Weight:      Height:       Weight change:   Intake/Output Summary (Last 24 hours) at 10/04/2019 1451 Last data filed at 10/04/2019 1011 Gross per 24 hour  Intake 1510 ml  Output 700 ml  Net 810 ml   Physical Exam Neck:     Comments: Surgical incision with overlying Dermabond without erythema or dehiscence.  Cardiovascular:     Rate and Rhythm: Normal rate and regular rhythm.     Heart sounds: Normal heart sounds. No murmur heard.   Neurological:     Mental Status: She is alert. Mental status is at baseline.     Comments: Speech hypophonic and dysarthric. Unchanged from previous. R facial droop unchanged from previous.  R arm strength 3/5, unchanged from previous. RLE strength 5/5, improved from yesterday.  Essential tremor of LUE and perioral muscles improved from yesterday.   Psychiatric:     Comments: Mood and affect appropriate; less anxious than yesterday.      Assessment/Plan:  Active Problems:   Stroke [facility])   Acute CVA (cerebrovascular accident) (HCC)   Benign essential HTN   Anxiety state   Depression   Chronic kidney disease   Prediabetes  Acute Stroke: Patient with history of HTN, HLD presented with difficulty speaking and R sided weakness. CT and MRI brain  consistent with acute ischemic stroke of L parietal lobe and possible petechial bleed. CTA Neck with critical stenosis of L internal carotid artery >90%. Carotid duplexUS w/ velocities c/w 80-99% stenosis of L ICA. TTE with EF 70-75%, grade 1 diastolic dysfunction, no evidence of intracardiac source of embolism. Patient has been in sinus rhythm throughout hospitalization.  The most likely source of [name]'s stroke is a L carotid atheroembolism. S/p L carotid endarterectomy. Reports doing well. Strength of RLE much improved compared to yesterday. Hypophonia, dysarthria, and RUE strength remain unchanged. Likely that she will require inpatient rehab for speech, strength, and RUE function. Patient currently in ICU on cleviprex drip for BP support during period of carotid baroreceptor instability s/p endarterectomy.  - CIR aware of patient, will re-evaluate.  - Aspirin held for surgery, restart once patient is moved to floor - Atorvastatin 80mg  daily - Consider adding Plavix after patient moved to floor  Tachycardia, resolved: Tachycardia resolved soon after restarting her propranolol and diazepam. Likelythat thiswas rebound tach from [facility] holding her B-blocker/Valium. - On telemetry. Continue to monitor vitals.  MDD:  Anxiety: Controlled on home Prozac and PRN Valium - Continue home Prozac - Valium 2mg  BID  HTN: Controlled on propranolol 60mg  BID at home. Had been holding for permissive hypertension. - Propranolol 20mg  BID today and titrate up to her  home dose of 60mg  BID  Essential Tremor: Patient with long-standing essential tremor most noticeable in L arm and perioral muscles. Improved today compared to admission. Possible that her home propranolol was providing some relief and that she is improving now that we are restarting this.  - Propranolol as above.   HLD: Information gathered from chart review as patient is not a great historian. It appears she does carry a diagnosis of  HLD, but her only home med is cholestyramine. This is an unusual monotherapy. She also has a history of cholecystectomy, so possible that her cholestyramine was prescribed for diarrhea d/t poor bile acid absorption. Total cholesterol 214; HDL 44; LDL 155;Trigs 75. - Holding home cholestyramine - Statin as above  Health Maintenance: Received first dose of Moderna COVID vaccine yesterday.    LOS: 3 days   , Medical Student 10/04/2019, 2:51 PM

## 2019-10-04 NOTE — Progress Notes (Signed)
SLP Cancellation Note  Patient Details Name: Elizabeth Tapia MRN: [id number] DOB: 03-04-1929   Cancelled treatment:       Reason Eval/Treat Not Completed: Patient at procedure or test/unavailable (Pt off unit for CEA. SLP will follow up on subsequent date.)  Yahaira Bruski I. Vear Clock, MS, CCC-SLP Acute Rehabilitation Services Office number [phone] Pager [phone]  Scheryl Marten 10/04/2019, 5:45 PM

## 2019-10-04 NOTE — Progress Notes (Signed)
   VASCULAR SURGERY POSTOP:   Doing well postop.  Her blood pressures under good control on Cleviprex.  She is on aspirin and is on a statin.   SUBJECTIVE:   No complaints.  PHYSICAL EXAM:   Vitals:   10/04/19 0303 10/04/19 1000 10/04/19 1015 10/04/19 1055  BP: (!) 150/75  121/69 (!) 119/58  Pulse: 79  99   Resp: 16  16 12   Temp: 97.7 F (36.5 C) 98 F (36.7 C)  (!) 97 F (36.1 C)  TempSrc: Oral     SpO2: 97%  98%   Weight:      Height:       NEURO: Her neurologic exam is at its baseline with profound left upper extremity weakness and expressive aphasia.  Good strength in the lower extremities. Her left neck incision looks fine.  LABS:   Lab Results  Component Value Date   WBC 7.7 10/04/2019   HGB 11.2 (L) 10/04/2019   HCT 35.4 (L) 10/04/2019   MCV 91.9 10/04/2019   PLT 375 10/04/2019   PROBLEM LIST:    Active Problems:   Stroke (HCC)   Acute CVA (cerebrovascular accident) (HCC)   Benign essential HTN   Anxiety state   Depression   Chronic kidney disease   Prediabetes   CURRENT MEDS:   .  stroke: mapping our early stages of recovery book   Does not apply Once  . amisulpride      . aspirin EC  325 mg Oral Daily  . atorvastatin  80 mg Oral Daily  . Chlorhexidine Gluconate Cloth  6 each Topical Daily  . diazepam  2 mg Oral q12n4p  . [START ON 10/05/2019] docusate sodium  100 mg Oral Daily  . FLUoxetine  10 mg Oral Daily  . mupirocin ointment  1 application Nasal BID  . pantoprazole  40 mg Oral Daily  . propranolol  20 mg Oral BID    12/05/2019 Office: Waverly Ferrari 10/04/2019

## 2019-10-04 NOTE — Op Note (Signed)
NAME: Elizabeth Tapia    MRN: [id number] DOB: [birth year]    DATE OF OPERATION: 10/04/2019  PREOP DIAGNOSIS:    Symptomatic left carotid stenosis  POSTOP DIAGNOSIS:    Same  PROCEDURE:    Left carotid endarterectomy with bovine pericardial patch angioplasty  SURGEON: Di Kindle. Edilia Bo, MD  ASSIST: Clinton Gallant, PA  ANESTHESIA: General  EBL: Minimal  INDICATIONS:    Elizabeth Tapia is a 86 y.o. female who had a left brain stroke.  In order to lower her risk for future stroke left carotid endarterectomy was recommended.  She had a critical left carotid stenosis.  FINDINGS:   Significant scar tissue around the internal carotid artery with the vagus nerve stuck to the artery.  The plaque was fairly focal.  TECHNIQUE:   The patient was taken to the operating room and received a general anesthetic.  Arterial line had been placed by anesthesia.  The left neck was prepped and draped in the usual sterile fashion.  An incision was made along the anterior border of the sternocleidomastoid and the dissection carried down to the common carotid artery which was dissected free and controlled with a remote Rummel tourniquet.  There was significant scarring around the carotid bifurcation and the proximal internal carotid artery.  I suspect this was related to the plaque.  I was able to dissected out the external carotid artery and controlled this with a vessel loop.  The internal carotid artery was controlled distally in the neck connected between the 2 dissections to safely dissected the vagus nerve off of the internal carotid artery.  The patient was heparinized previously.  ACT was monitored throughout the procedure.  Clamp was then placed on the internal than the common then the external carotid artery.  A longitudinal arteriotomy was made in the common carotid artery.  This was extended through the plaque into the internal carotid artery.  A 10 shunt was placed into the internal carotid  artery, backbled and then placed into the common carotid artery and secured with Rummel tourniquet.  Flow was reestablished to the shunt.  Flow was checked with a Doppler.  An endarterectomy plane was established proximally and the plaque was sharply divided.  Eversion endarterectomy was performed of the external carotid artery.  Distally there is a nice tapering the plaque and no tacking sutures were required.  A bovine pericardial patch was then sewn using continuous 6-0 Prolene suture.  Prior to completing the patch closure the shunt was removed.  The artery was backbled and flushed appropriately and the anastomosis completed.  Flow was reestablished first to the external carotid artery and into the internal carotid artery.  At the completion there was a good signal in the internal carotid artery with good diastolic flow.  Hemostasis was obtained in the wounds.  The wounds were then closed with a deep layer of 3-0 Vicryl, the platysma was closed with running 3-0 Vicryl and the skin closed with 4-0 Vicryl.  Dermabond was applied.  The patient awoke at her baseline.  She has marked weakness in the right upper extremity which she had preop.  She was moving her lower extremities.  She was transferred to the recovery room in stable condition.  All needle and sponge counts were correct.  Given the complexity of the case a first assistant was necessary in order to expedient the procedure and safely perform the technical aspects of the operation.  Elizabeth Ferrari, MD, FACS [facility]  DATE  OF DICTATION:   10/04/2019

## 2019-10-04 NOTE — Anesthesia Postprocedure Evaluation (Signed)
Anesthesia Post Note  Patient: Elizabeth Tapia  Procedure(s) Performed: ENDARTERECTOMY CAROTID (Left Neck)     Patient location during evaluation: PACU Anesthesia Type: General Level of consciousness: sedated and patient cooperative Pain management: pain level controlled Vital Signs Assessment: post-procedure vital signs reviewed and stable Respiratory status: spontaneous breathing Cardiovascular status: stable Anesthetic complications: no   No complications documented.  Last Vitals:  Vitals:   10/04/19 1015 10/04/19 1055  BP: 121/69 (!) 119/58  Pulse: 99   Resp: 16 12  Temp:  (!) 36.1 C  SpO2: 98%     Last Pain:  Vitals:   10/04/19 0303  TempSrc: Oral  PainSc:                  Lewie Loron

## 2019-10-05 LAB — CBC
HCT: 30.5 % — ABNORMAL LOW (ref 36.0–46.0)
Hemoglobin: 9.4 g/dL — ABNORMAL LOW (ref 12.0–15.0)
MCH: 28.9 pg (ref 26.0–34.0)
MCHC: 30.8 g/dL (ref 30.0–36.0)
MCV: 93.8 fL (ref 80.0–100.0)
Platelets: 362 10*3/uL (ref 150–400)
RBC: 3.25 MIL/uL — ABNORMAL LOW (ref 3.87–5.11)
RDW: 13.5 % (ref 11.5–15.5)
WBC: 7.9 10*3/uL (ref 4.0–10.5)
nRBC: 0 % (ref 0.0–0.2)

## 2019-10-05 LAB — BASIC METABOLIC PANEL
Anion gap: 14 (ref 5–15)
BUN: 27 mg/dL — ABNORMAL HIGH (ref 8–23)
CO2: 19 mmol/L — ABNORMAL LOW (ref 22–32)
Calcium: 8.5 mg/dL — ABNORMAL LOW (ref 8.9–10.3)
Chloride: 100 mmol/L (ref 98–111)
Creatinine, Ser: 1.4 mg/dL — ABNORMAL HIGH (ref 0.44–1.00)
GFR calc Af Amer: 39 mL/min — ABNORMAL LOW (ref >60)
GFR calc non Af Amer: 33 mL/min — ABNORMAL LOW (ref >60)
Glucose, Bld: 81 mg/dL (ref 70–99)
Potassium: 4.5 mmol/L (ref 3.5–5.1)
Sodium: 133 mmol/L — ABNORMAL LOW (ref 135–145)

## 2019-10-05 MED ORDER — CLOPIDOGREL BISULFATE 75 MG PO TABS
75.0000 mg | ORAL_TABLET | Freq: Every day | ORAL | Status: DC
  Administered 2019-10-05 – 2019-10-07 (×3): 75 mg via ORAL
  Filled 2019-10-05 (×3): qty 1

## 2019-10-05 NOTE — Progress Notes (Signed)
Received female pt awake alert  Not in distress via PCU bed with urse around with O2 via [place] at 2 LPM, connected pt to cardiac monitor, with IV cannula at left and right arm, purewick in place connected to suction wall, oriented pt to nurse call bell and room, TV on as per pt request

## 2019-10-05 NOTE — Progress Notes (Signed)
    Subjective  - POD #1, s/p left CEA  No complaints this am    Physical Exam:  Neck incision without hematoma Expressive aphasia and right UE weakness unchanged from prre-op    Assessment/Plan:  POD #1  Doing well from vascular perspective Continue ASA and plavix Continue to manage blood pressure  Wells Stephenie Navejas 10/05/2019 9:11 AM --  Vitals:   10/05/19 0600 10/05/19 0700  BP: (!) 128/54 (!) 132/54  Pulse: 71 82  Resp: 16 18  Temp:    SpO2: 97% 99%    Intake/Output Summary (Last 24 hours) at 10/05/2019 0911 Last data filed at 10/05/2019 0700 Gross per 24 hour  Intake 1172.91 ml  Output 700 ml  Net 472.91 ml     Laboratory CBC    Component Value Date/Time   WBC 7.9 10/05/2019 0501   HGB 9.4 (L) 10/05/2019 0501   HCT 30.5 (L) 10/05/2019 0501   PLT 362 10/05/2019 0501    BMET    Component Value Date/Time   NA 133 (L) 10/05/2019 0501   K 4.5 10/05/2019 0501   CL 100 10/05/2019 0501   CO2 19 (L) 10/05/2019 0501   GLUCOSE 81 10/05/2019 0501   BUN 27 (H) 10/05/2019 0501   CREATININE 1.40 (H) 10/05/2019 0501   CALCIUM 8.5 (L) 10/05/2019 0501   GFRNONAA 33 (L) 10/05/2019 0501   GFRAA 39 (L) 10/05/2019 0501    COAG Lab Results  Component Value Date   INR 1.0 10/01/2019   No results found for: PTT  Antibiotics Anti-infectives (From admission, onward)   Start     Dose/Rate Route Frequency Ordered Stop   10/04/19 2000  ceFAZolin (ANCEF) IVPB 1 g/50 mL premix        1 g 100 mL/hr over 30 Minutes Intravenous Every 12 hours 10/04/19 1227 10/05/19 0842       V. Charlena Cross, M.D., [facility] [place] Office: [phone] Pager:  [phone]

## 2019-10-05 NOTE — Progress Notes (Signed)
°  Speech Language Pathology Treatment: Dysphagia  Patient Details Name: Elizabeth Tapia MRN: [id number] DOB: 03-09-1929 Today's Date: 10/05/2019 Time: 7341-9379 SLP Time Calculation (min) (ACUTE ONLY): 23 min  Assessment / Plan / Recommendation Clinical Impression  Asked by medical service to re-assess Elizabeth Tapia's swallowing after choking episode at breakfast.  Pt communicative, participatory.  Son at bedside.  She describes choking while eating her french toast this am, saying that it scared her a bit.  Pt presents with mild jaw deviation, tongue midline upon extension.  Voice is diplophonic, hoarse c/w phonation after stroke but before CEA according to her son. Pt consumed 3 ounces of water without difficulty.  She consumed crackers and applesauce with functional mastication, adequate lip seal, preserved sensation lips/mouth.  There were no overt s/s of aspiration, even when taxed with mixed solid/liquid consistencies.  Cautioned pt to take her time when eating - we discussed potential impact of surgery and CVA on swallowing, which in this case appears to be quite mild. Changed diet to dysphagia 3, thin liquids; crush meds.  SLP will follow. D/W RN.   HPI HPI:  Elizabeth Tapia is an 86 year old female with a history of hypertension, hyperlipidemia, CKD IIIa, ?rheumatoid arthritis, anxiety, and depression who presents with an acute stroke. Patient was in her usual state of health last night and awoke this morning with slurred speech and R sided weakness. She was unable to walk. She lives with her grandson and called to him for help. She has no history of prior stroke or known vascular disease.   10/01/19 MRI head indicated: This demonstrates multiple foci of acute infarcts within the left frontal and parietal lobes in a watershed distribution.  Underwent left CEA 10/04/19.       SLP Plan  Continue with current plan of care       Recommendations  Diet recommendations: Dysphagia 3 (mechanical  soft);Thin liquid Liquids provided via: Cup;Straw Medication Administration: Crushed with puree Supervision: Staff to assist with self feeding Postural Changes and/or Swallow Maneuvers: Seated upright 90 degrees                Oral Care Recommendations: Oral care BID SLP Visit Diagnosis: Dysphagia, oropharyngeal phase (R13.12) Plan: Continue with current plan of care       GO               Pola Furno L. Samson Frederic, MA CCC/SLP Acute Rehabilitation Services Office number [phone] Pager [phone]  Blenda Mounts Laurice 10/05/2019, 10:35 AM

## 2019-10-05 NOTE — Progress Notes (Signed)
Physical Therapy Treatment Patient Details Name: Adelaine Roppolo MRN: [id number] DOB: [birth year] Today's Date: 10/05/2019    History of Present Illness 86 year old female with a history of hypertension, hyperlipidemia, CKD IIIa, ?rheumatoid arthritis, anxiety, and depression who presented 10/01/19 with slurred speech, R sided weakness, and unable to walk. MRI multiple foci of acute infarcts within the left frontal and parietal lobes in a watershed distribution. +L ICA stenosis. Pt underwent LCEA 10/1.    PT Comments    Pt received in bed having just finished lunch. Agreeable to participation in PT. She required mod assist bed mobility, +2 mod assist sit to stand, and +2 mod assist SPT. Ambulation attempt hindered by inability to attain upright stance. Pt maintained very flexed posture during static stance and transfer. Pt able to take shuffle pivotal steps toward left bed to recliner. Pt in recliner with feet elevated at end of session.    Follow Up Recommendations  CIR;Supervision/Assistance - 24 hour     Equipment Recommendations  Other (comment) (TBD next venue)    Recommendations for Other Services       Precautions / Restrictions Precautions Precautions: Fall    Mobility  Bed Mobility Overal bed mobility: Needs Assistance Bed Mobility: Supine to Sit     Supine to sit: Mod assist;HOB elevated     General bed mobility comments: assist with BLE and to elevate trunk, use of bed pad to scoot to EOB  Transfers Overall transfer level: Needs assistance Equipment used: 2 person hand held assist Transfers: Sit to/from [employer] Sit to Stand: Mod assist;+2 physical assistance Stand pivot transfers: +2 physical assistance;Mod assist       General transfer comment: standing trial x 2. Pt demostrates very flexed posture, unable to correct with cues/assist hindering ability to progress gait. SPT performed bed to recliner toward left.  Ambulation/Gait              General Gait Details: shuffle, pivotal steps toward left for transfer to recliner   Stairs             Wheelchair Mobility    Modified Rankin (Stroke Patients Only) Modified Rankin (Stroke Patients Only) Pre-Morbid Rankin Score: Slight disability Modified Rankin: Moderately severe disability     Balance Overall balance assessment: Needs assistance Sitting-balance support: Single extremity supported;Feet unsupported Sitting balance-Leahy Scale: Poor Sitting balance - Comments: assist to maintain balance Postural control: Posterior lean Standing balance support: Bilateral upper extremity supported;During functional activity Standing balance-Leahy Scale: Poor Standing balance comment: reliant on external support                            Cognition Arousal/Alertness: Awake/alert Behavior During Therapy: WFL for tasks assessed/performed Overall Cognitive Status: Impaired/Different from baseline Area of Impairment: Safety/judgement;Awareness;Problem solving                         Safety/Judgement: Decreased awareness of safety;Decreased awareness of deficits Awareness: Intellectual Problem Solving: Difficulty sequencing;Requires verbal cues        Exercises      General Comments General comments (skin integrity, edema, etc.): VSS      Pertinent Vitals/Pain Pain Assessment: No/denies pain    Home Living                      Prior Function            PT Goals (current goals can  now be found in the care plan section) Acute Rehab PT Goals Patient Stated Goal: not stated Progress towards PT goals: Progressing toward goals    Frequency    Min 4X/week      PT Plan Current plan remains appropriate    Co-evaluation              AM-PAC PT "6 Clicks" Mobility   Outcome Measure  Help needed turning from your back to your side while in a flat bed without using bedrails?: A Little Help needed moving from lying  on your back to sitting on the side of a flat bed without using bedrails?: A Lot Help needed moving to and from a bed to a chair (including a wheelchair)?: A Lot Help needed standing up from a chair using your arms (e.g., wheelchair or bedside chair)?: A Lot Help needed to walk in hospital room?: Total Help needed climbing 3-5 steps with a railing? : Total 6 Click Score: 11    End of Session Equipment Utilized During Treatment: Gait belt Activity Tolerance: Patient tolerated treatment well Patient left: in chair;with call bell/phone within reach;with chair alarm set Nurse Communication: Mobility status PT Visit Diagnosis: Hemiplegia and hemiparesis;Other symptoms and signs involving the nervous system (R29.898);Other abnormalities of gait and mobility (R26.89) Hemiplegia - Right/Left: Right Hemiplegia - dominant/non-dominant: Dominant Hemiplegia - caused by: Cerebral infarction     Time: 0630-1601 PT Time Calculation (min) (ACUTE ONLY): 18 min  Charges:  $Therapeutic Activity: 8-22 mins                     Aida Raider, PT  Office # [phone] Pager [phone]    Ilda Foil 10/05/2019, 2:41 PM

## 2019-10-05 NOTE — Progress Notes (Signed)
Subjective:  Patient interviewed at bedside. She had a choking episode with breakfast but reports "I'm doing very well aside from getting choked." Her surgical site pain is "not bad at all." Weakness and difficulty speaking are both improved from yesterday. She has no complaints at this time.  Denies CP, visual changes, or new weakness.   Objective:  Vital signs in last 24 hours: Vitals:   10/05/19 0500 10/05/19 0600 10/05/19 0700 10/05/19 0800  BP: (!) 127/55 (!) 128/54 (!) 132/54   Pulse: 84 71 82   Resp: 18 16 18    Temp:    97.8 F (36.6 C)  TempSrc:    Oral  SpO2: 99% 97% 99%   Weight:      Height:       Weight change:   Intake/Output Summary (Last 24 hours) at 10/05/2019 1040 Last data filed at 10/05/2019 0700 Gross per 24 hour  Intake 872.91 ml  Output 700 ml  Net 172.91 ml   Physical Exam Neck:     Comments: Surgical scar with overlying dermabond, without swelling, erythema or dehiscence Cardiovascular:     Rate and Rhythm: Normal rate and regular rhythm.     Heart sounds: Normal heart sounds. No murmur heard.   Pulmonary:     Effort: Pulmonary effort is normal.     Breath sounds: Normal breath sounds.  Neurological:     Mental Status: She is alert. Mental status is at baseline.     Comments: Speech remains hypophonic, though improved from previous. R-sided facial droop unchanged from previous.  Able to raise R arm ~8 cm against gravity. Grip strength 4/5 in RUE. Strength 5/5 in RLE, LUE, LLE.  Essential tremor of L arm improved from previous. No tremor noted in perioral muscles today.  Psychiatric:     Comments: Less anxious than previous.     Assessment/Plan:  Active Problems:   Stroke (HCC)   Acute CVA (cerebrovascular accident) (HCC)   Benign essential HTN   Anxiety state   Depression   Chronic kidney disease   Prediabetes   Pressure injury of skin  Acute Stroke: Patient with history of HTN, HLD presented with difficulty speaking and R sided  weakness. CT and MRI brain consistent with acute ischemic stroke of L parietal lobe and possible petechial bleed. CTA Neck with critical stenosis of L internal carotid artery >90%. Carotid duplexUS w/ velocities c/w 80-99% stenosis of L ICA. TTE with EF 70-75%, grade 1 diastolic dysfunction, no evidence of intracardiac source of embolism. Patient has been in sinus rhythm throughout hospitalization. The most likely source of Elizabeth Tapia's stroke is a L carotid atheroembolism. S/p L carotid endarterectomy. Reports doing well. Strength of RLE much improved compared to yesterday. Hypophonia, dysarthria, and RUE strength remain unchanged. Likely that she will require inpatient rehab for speech, strength, and RUE function. Patient has been in 4N ICU for Cleviprex gtt. She was taken off this morning and can now be transferred to the floor.  - Transfer to floor -CIR aware of patient with plans to admit her  -Restart ASA 81mg  daily - Start Plavix  - Atorvastatin 80mg  daily   Tachycardia, resolved: Tachycardia resolved soon after restarting her propranolol and diazepam. Likelythat thiswas rebound tach from 12/05/2019 holding her B-blocker/Valium. - On telemetry. Continue to monitor vitals.  MDD:  Anxiety: Controlled on home Prozac and PRN Valium - Continue homeProzac - Valium 2mg  BID  HTN: Controlled on propranolol60mg  BIDat home.Had been holding for permissive hypertension. - Propranolol 20mg   BID today and titrate up to her home dose of 60mg  BID  Essential Tremor: Patient with long-standing essential tremor most noticeable in L arm and perioral muscles. Improved today compared to admission. Possible that her home propranolol was providing some relief and that she is improving now that we have restarted this.  - Propranolol as above.  HLD: Information gathered from chart review as patient is not a great historian. It appears she does carry a diagnosis of HLD, but her only home med is  cholestyramine. This is an unusual monotherapy. She also has a history of cholecystectomy, so possible that her cholestyramine was prescribed for diarrhea d/t poor bile acid absorption. Total cholesterol 214; HDL 44; LDL 155;Trigs 75. - Holding home cholestyramine -Statin as above  Health Maintenance: Received first dose of Moderna COVID vaccine 9/30.     LOS: 4 days   10/30, Medical Student 10/05/2019, 10:40 AM

## 2019-10-05 NOTE — Discharge Summary (Addendum)
Name: Elizabeth Tapia MRN: [id number] DOB: 04-12-1929 86 y.o. PCP: Tarri Fuller, MD  Date of Admission: 10/01/2019 10:31 AM Date of Discharge:  Attending Physician: Gust Rung, DO  Discharge Diagnosis: 1. Acute left parietal CVA 2. Left Carotid stenosis 3. S/P Left carotid endarterectomy 10/04/19  Discharge Medications: Allergies as of 10/07/2019   No Known Allergies      Medication List     STOP taking these medications    cholestyramine 4 g packet Commonly known as: QUESTRAN       TAKE these medications    acetaminophen 500 MG tablet Commonly known as: TYLENOL Take 500 mg by mouth every 6 (six) hours as needed for mild pain.   aspirin EC 81 MG tablet Take 4 tablets (325 mg total) by mouth daily.   atorvastatin 80 MG tablet Commonly known as: LIPITOR Take 1 tablet (80 mg total) by mouth daily.   clopidogrel 75 MG tablet Commonly known as: PLAVIX Take 1 tablet (75 mg total) by mouth daily.   diazepam 2 MG tablet Commonly known as: VALIUM Take 2 mg by mouth every 12 (twelve) hours as needed for anxiety.   FLUoxetine 10 MG capsule Commonly known as: PROZAC Take 10 mg by mouth daily.   propranolol 60 MG tablet Commonly known as: INDERAL Take 60 mg by mouth 2 (two) times daily.        Disposition and follow-up:   Ms.Ayahna Tapia was discharged from [facility] in Good condition.  At the hospital follow up visit please address:  Acute stroke:  Patient was living independently prior to stroke. Planning of moving in with her son and daughter-in-law after inpatient rehab.  - Patient discharged to inpatient rehab. - Patient placed on dysphagia 3 (mechanical soft) diet with thin liquids secondary to a choking episode at breakfast on 10/05/19. She will require continued speech therapy. - Reassess ADLs/IADLs  MDD: Anxiety: Patient with baseline depression/anxiety and at high risk of post-stroke depression. She was continued on  her home regimen of fluoxetine 10 mg daily and diazepam 2 mg every 12 hours as needed in the hospital. - Suggest PHQ-9 at follow-up    HLD: Patient was previously on cholestyramine monotherapy. Started on atorvastatin 80mg  daily s/p stroke. - Recommend follow-up Lipid panel in 6-8 weeks  Abnormal CT finding: CTA neck showed prominent pannus posterior to the dens with subluxation of C1 on C2 in a patient with ?history of rheumatoid arthritis. Patient asymptomatic. - Consider rheumatology follow-up   Health Maintenance: Received first dose of Moderna COVID vaccine 9/30.  - Ensure patient gets second dose.  2.  Labs / imaging needed at time of follow-up: none  3.  Pending labs/ test needing follow-up: none  Follow-up Appointments:  Follow-up Information     10/30, MD In 2 weeks.   Specialties: Vascular Surgery, Cardiology Why: Office will call you to arrange your appt (sent) Contact information: [address] [place] [phone]         [phone], MD Follow up in 1 week(s).   Specialty: Family Medicine Contact information: [address] [place] [phone]                 Hospital Course by problem list:  Acute Left Parietal Stroke with Left Carotid stenosis: Patient presented to the [facility] on 10/01/19 after waking up with new onset R-sided weakness and difficulty speaking. Last known normal was >12 hours prior. Code stroke was called  and CT head demonstrated a L parietal infarct; findings corroborated by MRI Brain. CTA neck revealed critical stenosis >90% of L internal carotid artery; findings corroborated by Vascular US carotid. TTE showed LVEF 70-75%, Grade I diastolic dysfunction, no valvular pathology, and no identified source of embolism. Patient was found to have profound weakness of RUE and moderate dysarthria with slurring of speech. Strength and sensation were intact elsewhere. Patient was out of the  therapeutic window for thrombolysis. Permissive hypertension protocol was observed and patient received appropriate medical management with ASA, Plavix, and a statin. On 9/30, the patient's dysarthria and weakness were noted to be acutely worse but CT head was unchanged. It was determined that her worsened symptoms were likely related to post-stroke vasogenic edema and expected to improve with time. She underwent L carotid endarterectomy on 10/1. On 10/2 her strength and speech were improved compared to previous. PT/OT/Speech recommended inpatient rehabilitation and patient was discharged to CIR.  - Aspirin 81 mg daily - Plavix 75 mg daily - Atorvastatin 80 mg daily   Discharge Vitals:   BP (!) 144/67 (BP Location: Right Arm)   Pulse 81   Temp 98.2 F (36.8 C) (Oral)   Resp 20   Ht 5\' 2"  (1.575 m)   Wt 41.7 kg   SpO2 97%   BMI 16.83 kg/m   Pertinent Labs, Studies, and Procedures:    10/01/19 MRI HEAD WITHOUT CONTRAST   TECHNIQUE: Only DWI and ADC imaging was performed given patient intolerance.   COMPARISON:  Same day code stroke.   FINDINGS: Only DWI and ADC imaging was performed given patient intolerance.   This demonstrates multiple foci of acute infarcts within the left frontal and parietal lobe cortex and subcortical white matter. The most confluent area of acute infarct is in the left parietal lobe (distal left MCA territory) correlates with the hypodensity seen on CT.   No evidence of hydrocephalus.   IMPRESSION: Only DWI and ADC imaging was performed given patient intolerance.   This demonstrates multiple foci of acute infarcts within the left frontal and parietal lobes in a watershed distribution. The most confluent area of acute infarct is in the left parietal lobe (distal left MCA territory) and correlates with the hypodensity seen on CT.   10/03/19 CT HEAD WITHOUT CONTRAST   COMPARISON:  CT and MRI 10/01/2019   FINDINGS: Brain: Similar appearance of high  left frontal and left parietal hypodensities with areas of loss of gray-white differentiation, compatible with acute/subacute infarcts better seen on recent DWI MRI. Similar small area of relative hyperdensity in the left parietal infarct. No new hemorrhage. No progressive mass effect. No midline shift. Basal cisterns are patent. No evidence of new large vascular territory infarct. No hydrocephalus. Similar additional scattered white matter hypoattenuation, compatible with chronic microvascular ischemic disease.   Vascular: Calcific atherosclerosis without evidence of hyperdense vessel.   Skull: Normal. Negative for fracture or focal lesion.   Sinuses/Orbits: Similar chronic right sphenoid sinus disease. Remaining sinuses are clear.   Other: Similar underpneumatized left mastoid air cells with trace left mastoid effusion.   IMPRESSION: 1. Similar findings of acute/subacute left frontoparietal infarcts. 2. Similar small area of relative hyperdensity in the left parietal infarct may represent preserved brain parenchyma (favored) or a small amount of hemorrhage. No new hemorrhage. No progressive mass effect.   Discharge Instructions: Discharge Instructions     Call MD for:  difficulty breathing, headache or visual disturbances   Complete by: As directed    Call MD  for:  persistant dizziness or light-headedness   Complete by: As directed    Call MD for:  redness, tenderness, or signs of infection (pain, swelling, redness, odor or green/yellow discharge around incision site)   Complete by: As directed    Call MD for:  severe uncontrolled pain   Complete by: As directed    Discharge instructions   Complete by: As directed    Ms. Kall, it was a pleasure taking care of you. You were admitted to the hospital after having a stroke. You were evaluated by neurologists (brain doctors) and started on medicines to help prevent another stroke: aspirin, Plavix, and a statin  (cholesterol-lowering medicine). You had surgery to clean out blockage in the artery on the left side of your neck, your carotid artery. This procedure is called a carotid endarterectomy, and vascular surgeons performed this surgery. It is very important that you follow up with vascular surgery. We would also like you to follow-up with your primary care doctor and neurology as needed. Continue the medications that were started in the hospital, and your other doctors will help you decide which medicines you will need to continue and for how long when you follow-up with them.  Take care!   Increase activity slowly   Complete by: As directed    No wound care   Complete by: As directed          Signed: Alphonzo Severance, MD 10/07/2019, 11:00 AM   Pager: [phone]

## 2019-10-06 DIAGNOSIS — Z9889 Other specified postprocedural states: Secondary | ICD-10-CM

## 2019-10-06 DIAGNOSIS — N1831 Chronic kidney disease, stage 3a: Secondary | ICD-10-CM

## 2019-10-06 LAB — BASIC METABOLIC PANEL
Anion gap: 9 (ref 5–15)
BUN: 29 mg/dL — ABNORMAL HIGH (ref 8–23)
CO2: 23 mmol/L (ref 22–32)
Calcium: 8.6 mg/dL — ABNORMAL LOW (ref 8.9–10.3)
Chloride: 103 mmol/L (ref 98–111)
Creatinine, Ser: 1.39 mg/dL — ABNORMAL HIGH (ref 0.44–1.00)
GFR calc Af Amer: 39 mL/min — ABNORMAL LOW (ref >60)
GFR calc non Af Amer: 34 mL/min — ABNORMAL LOW (ref >60)
Glucose, Bld: 107 mg/dL — ABNORMAL HIGH (ref 70–99)
Potassium: 3.9 mmol/L (ref 3.5–5.1)
Sodium: 135 mmol/L (ref 135–145)

## 2019-10-06 LAB — CBC WITH DIFFERENTIAL/PLATELET
Abs Immature Granulocytes: 0.02 10*3/uL (ref 0.00–0.07)
Basophils Absolute: 0 10*3/uL (ref 0.0–0.1)
Basophils Relative: 0 %
Eosinophils Absolute: 0.1 10*3/uL (ref 0.0–0.5)
Eosinophils Relative: 2 %
HCT: 30 % — ABNORMAL LOW (ref 36.0–46.0)
Hemoglobin: 9.5 g/dL — ABNORMAL LOW (ref 12.0–15.0)
Immature Granulocytes: 0 %
Lymphocytes Relative: 30 %
Lymphs Abs: 2.7 10*3/uL (ref 0.7–4.0)
MCH: 29.5 pg (ref 26.0–34.0)
MCHC: 31.7 g/dL (ref 30.0–36.0)
MCV: 93.2 fL (ref 80.0–100.0)
Monocytes Absolute: 0.8 10*3/uL (ref 0.1–1.0)
Monocytes Relative: 9 %
Neutro Abs: 5.4 10*3/uL (ref 1.7–7.7)
Neutrophils Relative %: 59 %
Platelets: 381 10*3/uL (ref 150–400)
RBC: 3.22 MIL/uL — ABNORMAL LOW (ref 3.87–5.11)
RDW: 13.4 % (ref 11.5–15.5)
WBC: 9.2 10*3/uL (ref 4.0–10.5)
nRBC: 0 % (ref 0.0–0.2)

## 2019-10-06 MED ORDER — PROPRANOLOL HCL 10 MG PO TABS
60.0000 mg | ORAL_TABLET | Freq: Two times a day (BID) | ORAL | Status: DC
  Administered 2019-10-06 – 2019-10-07 (×2): 60 mg via ORAL
  Filled 2019-10-06 (×2): qty 6

## 2019-10-06 NOTE — Discharge Instructions (Signed)
   [facility]  Discharge Instructions   Carotid Endarterectomy (CEA)  Please refer to the following instructions for your post-procedure care. Your surgeon or physician assistant will discuss any changes with you.  Activity  You are encouraged to walk as much as you can. You can slowly return to normal activities but must avoid strenuous activity and heavy lifting until your doctor tell you it's OK. Avoid activities such as vacuuming or swinging a golf club. You can drive after one week if you are comfortable and you are no longer taking prescription pain medications. It is normal to feel tired for serval weeks after your surgery. It is also normal to have difficulty with sleep habits, eating, and bowel movements after surgery. These will go away with time.  Bathing/Showering  You may shower after you come home. Do not soak in a bathtub, hot tub, or swim until the incision heals completely.  Incision Care  Shower every day. Clean your incision with mild soap and water. Pat the area dry with a clean towel. You do not need a bandage unless otherwise instructed. Do not apply any ointments or creams to your incision. You may have skin glue on your incision. Do not peel it off. It will come off on its own in about one week. Your incision may feel thickened and raised for several weeks after your surgery. This is normal and the skin will soften over time. For Men Only: It's OK to shave around the incision but do not shave the incision itself for 2 weeks. It is common to have numbness under your chin that could last for several months.  Diet  Resume your normal diet. There are no special food restrictions following this procedure. A low fat/low cholesterol diet is recommended for all patients with vascular disease. In order to heal from your surgery, it is CRITICAL to get adequate nutrition. Your body requires vitamins, minerals, and protein. Vegetables are the best  source of vitamins and minerals. Vegetables also provide the perfect balance of protein. Processed food has little nutritional value, so try to avoid this.        Medications  Resume taking all of your medications unless your doctor or physician assistant tells you not to. If your incision is causing pain, you may take over-the- counter pain relievers such as acetaminophen (Tylenol). If you were prescribed a stronger pain medication, please be aware these medications can cause nausea and constipation. Prevent nausea by taking the medication with a snack or meal. Avoid constipation by drinking plenty of fluids and eating foods with a high amount of fiber, such as fruits, vegetables, and grains. Do not take Tylenol if you are taking prescription pain medications.  Follow Up  Our office will schedule a follow up appointment 2-3 weeks following discharge.  Please call us immediately for any of the following conditions  Increased pain, redness, drainage (pus) from your incision site. Fever of 101 degrees or higher. If you should develop stroke (slurred speech, difficulty swallowing, weakness on one side of your body, loss of vision) you should call 911 and go to the nearest emergency room.  Reduce your risk of vascular disease:  Stop smoking. If you would like help call [facility] at 1-800-QUIT-NOW (1-800-784-8669) or [facility] at 336-586-4000. Manage your cholesterol Maintain a desired weight Control your diabetes Keep your blood pressure down  If you have any questions, please call the office at [phone].   

## 2019-10-06 NOTE — Progress Notes (Addendum)
HD#5 Subjective:   No acute events overnight.  Evaluated at bedside during rounds this morning. States she is "doing alright," has not had any additional choking episodes. Endorses difficulty sleeping last night secondary to mild anxiety. Has no complaints, denies new or worsening symptoms.  Objective:  Vital signs in last 24 hours: Vitals:   10/06/19 0200 10/06/19 0321 10/06/19 0754 10/06/19 1227  BP:  (!) 120/50 (!) 145/62 138/67  Pulse: 71 70 86 78  Resp: 15 15 20    Temp:  97.6 F (36.4 C) 98.2 F (36.8 C) 98.2 F (36.8 C)  TempSrc:  Oral Oral Oral  SpO2: 100% 98% 97%   Weight:      Height:        Physical Exam:   Constitutional: tired-appearing woman propped upright in bed, in no acute distress Neck: surgical incision with overlying dermabond, without swelling, erythema, or dehiscence Cardiovascular: regular rate and rhythm, no m/r/g Pulmonary/Chest: normal work of breathing on room air, lungs clear to auscultation bilaterally, cough is stronger than before Neurological: alert & oriented x 3, speech is hypophonic though continues to improve. Baseline R-sided facial droop. Able to raise R arm ~8 cm against gravity. Grip strength 3/5. Strength 5/5 in RLE, LUE, LLE. Essential tremor of L arm improved from previous. No tremor noted in perioral muscles today.   Pertinent Labs: CBC Latest Ref Rng & Units 10/06/2019 10/05/2019 10/04/2019  WBC 4.0 - 10.5 K/uL 9.2 7.9 7.7  Hemoglobin 12.0 - 15.0 g/dL 12/04/2019) 0.9(W) 11.2(L)  Hematocrit 36 - 46 % 30.0(L) 30.5(L) 35.4(L)  Platelets 150 - 400 K/uL 381 362 375    CMP Latest Ref Rng & Units 10/06/2019 10/05/2019 10/04/2019  Glucose 70 - 99 mg/dL 12/04/2019) 81 147(W)  BUN 8 - 23 mg/dL 295(A) 21(H) 19  Creatinine 0.44 - 1.00 mg/dL 08(M) 5.78(I) 6.96(E)  Sodium 135 - 145 mmol/L 135 133(L) 135  Potassium 3.5 - 5.1 mmol/L 3.9 4.5 4.2  Chloride 98 - 111 mmol/L 103 100 100  CO2 22 - 32 mmol/L 23 19(L) 25  Calcium 8.9 - 10.3 mg/dL 9.52(W)  4.1(L) 9.5  Total Protein 6.5 - 8.1 g/dL - - -  Total Bilirubin 0.3 - 1.2 mg/dL - - -  Alkaline Phos 38 - 126 U/L - - -  AST 15 - 41 U/L - - -  ALT 0 - 44 U/L - - -    Assessment/Plan:   Active Problems:   Stroke (HCC)   Acute CVA (cerebrovascular accident) (HCC)   Benign essential HTN   Anxiety state   Depression   Chronic kidney disease   Prediabetes   Pressure injury of skin   Patient Summary: Elizabeth Tapia is a 86 y.o. with a pertinent PMH of HTN, HLD, CKD IIIa, anxiety, and depression who presented with difficulty speaking and R-sided weakness and admitted for stroke evaluation.  Acute Stroke S/p L carotid endarterectomy POD 2 CT and MRI brain consistent with acute ischemic stroke of L parietal lobe and possible petechial bleed. CTA Neck with critical stenosis of L internal carotid artery >90%. Carotid duplexUS w/ velocities c/w 80-99% stenosis of L ICA. TTE with EF 70-75%, grade 1 diastolic dysfunction, no evidence of intracardiac source of embolism. Patient has been in sinus rhythm throughout hospitalization. S/p L carotid endarterectomy POD 2. Patient continues to do well. Improving R-sided strength. Hypophonia and dysarthria unchanged. Patient has been evaluated and deemed appropriate for CIR for continued rehabilitation. Medically stable for discharge. -CIR aware of patient  with plans to admit her  -ASA 81mg  daily - Plavix 75 daily - Atorvastatin 80mg  daily  Tachycardia, resolved: Tachycardia resolved soon after restarting her propranolol and diazepam. Likelythat thiswas rebound tach from holding her B-blocker/Valium. - On telemetry. Continue to monitor vitals.  MDD:  Anxiety: Controlled on home Prozac and PRN Valium - Continue homeProzac - Valium2mg BID  HTN: Controlled on propranolol60mg  BIDat home.Had been holding for permissive hypertension. -Propranolol 60mg  BID  Essential Tremor: Patient with long-standing essential tremor most  noticeable in L arm and perioral muscles. Improved today compared to admission. Possible that her home propranolol was providing some relief and that she is improving now that we have restarted this.  - Propranolol as above.  HLD: Information gathered from chart review as patient is not a great historian. It appears she does carry a diagnosis of HLD, but her only home med is cholestyramine. This is an unusual monotherapy. She also has a history of cholecystectomy, so possible that her cholestyramine was prescribed for diarrhea d/t poor bile acid absorption. Total cholesterol 214; HDL 44; LDL 155;Trigs 75. - Holding home cholestyramine -Statin as above  Health Maintenance: Received first dose of Moderna COVID vaccine 9/30.  Diet: Dysphagia 3 IVF: None,None VTE: SCDs Code: DNR PT/OT recs: CIR   Dispo: Anticipated discharge to CIR pending bed availability.   [place], MD 10/05/2019,6:55 AM Pager: [phone] Please contact the on call pager after 5 pm and on weekends at [phone].   Internal Medicine Attending:   I saw and examined the patient. I reviewed the resident's note and I agree with the resident's findings and plan as documented in the resident's note.  12/05/2019, MD

## 2019-10-06 NOTE — Plan of Care (Signed)
  Problem: Education: Goal: Knowledge of General Education information will improve Description: Including pain rating scale, medication(s)/side effects and non-pharmacologic comfort measures Outcome: Progressing   Problem: Health Behavior/Discharge Planning: Goal: Ability to manage health-related needs will improve Outcome: Progressing   Problem: Clinical Measurements: Goal: Ability to maintain clinical measurements within normal limits will improve Outcome: Progressing Goal: Will remain free from infection Outcome: Progressing Goal: Diagnostic test results will improve Outcome: Progressing Goal: Respiratory complications will improve Outcome: Progressing Goal: Cardiovascular complication will be avoided Outcome: Progressing   Problem: Activity: Goal: Risk for activity intolerance will decrease Outcome: Progressing   Problem: Nutrition: Goal: Adequate nutrition will be maintained Outcome: Progressing   Problem: Coping: Goal: Level of anxiety will decrease Outcome: Progressing   Problem: Elimination: Goal: Will not experience complications related to bowel motility Outcome: Progressing Goal: Will not experience complications related to urinary retention Outcome: Progressing   Problem: Pain Managment: Goal: General experience of comfort will improve Outcome: Progressing   Problem: Safety: Goal: Ability to remain free from injury will improve Outcome: Progressing   Problem: Skin Integrity: Goal: Risk for impaired skin integrity will decrease Outcome: Progressing   Problem: Education: Goal: Knowledge of disease or condition will improve Outcome: Progressing Goal: Knowledge of secondary prevention will improve Outcome: Progressing Goal: Knowledge of patient specific risk factors addressed and post discharge goals established will improve Outcome: Progressing Goal: Individualized Educational Video(s) Outcome: Progressing   Problem: Coping: Goal: Will verbalize  positive feelings about self Outcome: Progressing Goal: Will identify appropriate support needs Outcome: Progressing   Problem: Health Behavior/Discharge Planning: Goal: Ability to manage health-related needs will improve Outcome: Progressing   Problem: Self-Care: Goal: Ability to participate in self-care as condition permits will improve Outcome: Progressing   

## 2019-10-07 ENCOUNTER — Encounter (HOSPITAL_COMMUNITY): Payer: Self-pay | Admitting: Vascular Surgery

## 2019-10-07 ENCOUNTER — Inpatient Hospital Stay (HOSPITAL_COMMUNITY): Payer: Medicare Other

## 2019-10-07 ENCOUNTER — Other Ambulatory Visit: Payer: Self-pay

## 2019-10-07 ENCOUNTER — Other Ambulatory Visit: Payer: Self-pay | Admitting: Student

## 2019-10-07 ENCOUNTER — Inpatient Hospital Stay (HOSPITAL_COMMUNITY)
Admission: RE | Admit: 2019-10-07 | Discharge: 2019-10-29 | DRG: 056 | Disposition: A | Discharge status: Home Health | Payer: Medicare Other | Source: Intra-hospital | Attending: Physical Medicine & Rehabilitation | Admitting: Physical Medicine & Rehabilitation

## 2019-10-07 DIAGNOSIS — I69322 Dysarthria following cerebral infarction: Secondary | ICD-10-CM | POA: Diagnosis not present

## 2019-10-07 DIAGNOSIS — I639 Cerebral infarction, unspecified: Secondary | ICD-10-CM

## 2019-10-07 DIAGNOSIS — F411 Generalized anxiety disorder: Secondary | ICD-10-CM | POA: Diagnosis present

## 2019-10-07 DIAGNOSIS — R1314 Dysphagia, pharyngoesophageal phase: Secondary | ICD-10-CM | POA: Diagnosis present

## 2019-10-07 DIAGNOSIS — Z8679 Personal history of other diseases of the circulatory system: Secondary | ICD-10-CM

## 2019-10-07 DIAGNOSIS — I63232 Cerebral infarction due to unspecified occlusion or stenosis of left carotid arteries: Secondary | ICD-10-CM | POA: Diagnosis not present

## 2019-10-07 DIAGNOSIS — E8809 Other disorders of plasma-protein metabolism, not elsewhere classified: Secondary | ICD-10-CM | POA: Diagnosis not present

## 2019-10-07 DIAGNOSIS — B9620 Unspecified Escherichia coli [E. coli] as the cause of diseases classified elsewhere: Secondary | ICD-10-CM | POA: Diagnosis present

## 2019-10-07 DIAGNOSIS — E43 Unspecified severe protein-calorie malnutrition: Secondary | ICD-10-CM | POA: Insufficient documentation

## 2019-10-07 DIAGNOSIS — I69390 Apraxia following cerebral infarction: Secondary | ICD-10-CM | POA: Diagnosis not present

## 2019-10-07 DIAGNOSIS — I129 Hypertensive chronic kidney disease with stage 1 through stage 4 chronic kidney disease, or unspecified chronic kidney disease: Secondary | ICD-10-CM | POA: Diagnosis present

## 2019-10-07 DIAGNOSIS — I69318 Other symptoms and signs involving cognitive functions following cerebral infarction: Secondary | ICD-10-CM | POA: Diagnosis not present

## 2019-10-07 DIAGNOSIS — R251 Tremor, unspecified: Secondary | ICD-10-CM | POA: Diagnosis present

## 2019-10-07 DIAGNOSIS — K6811 Postprocedural retroperitoneal abscess: Secondary | ICD-10-CM | POA: Diagnosis not present

## 2019-10-07 DIAGNOSIS — G8191 Hemiplegia, unspecified affecting right dominant side: Secondary | ICD-10-CM

## 2019-10-07 DIAGNOSIS — Z79899 Other long term (current) drug therapy: Secondary | ICD-10-CM

## 2019-10-07 DIAGNOSIS — M720 Palmar fascial fibromatosis [Dupuytren]: Secondary | ICD-10-CM | POA: Diagnosis present

## 2019-10-07 DIAGNOSIS — I10 Essential (primary) hypertension: Secondary | ICD-10-CM

## 2019-10-07 DIAGNOSIS — K5900 Constipation, unspecified: Secondary | ICD-10-CM | POA: Diagnosis not present

## 2019-10-07 DIAGNOSIS — M3119 Other thrombotic microangiopathy: Secondary | ICD-10-CM | POA: Diagnosis present

## 2019-10-07 DIAGNOSIS — R32 Unspecified urinary incontinence: Secondary | ICD-10-CM | POA: Diagnosis not present

## 2019-10-07 DIAGNOSIS — T148XXA Other injury of unspecified body region, initial encounter: Secondary | ICD-10-CM

## 2019-10-07 DIAGNOSIS — L89101 Pressure ulcer of unspecified part of back, stage 1: Secondary | ICD-10-CM | POA: Diagnosis present

## 2019-10-07 DIAGNOSIS — N390 Urinary tract infection, site not specified: Secondary | ICD-10-CM

## 2019-10-07 DIAGNOSIS — Z681 Body mass index (BMI) 19 or less, adult: Secondary | ICD-10-CM | POA: Diagnosis not present

## 2019-10-07 DIAGNOSIS — E871 Hypo-osmolality and hyponatremia: Secondary | ICD-10-CM | POA: Diagnosis present

## 2019-10-07 DIAGNOSIS — R54 Age-related physical debility: Secondary | ICD-10-CM | POA: Diagnosis present

## 2019-10-07 DIAGNOSIS — R410 Disorientation, unspecified: Secondary | ICD-10-CM | POA: Diagnosis not present

## 2019-10-07 DIAGNOSIS — M069 Rheumatoid arthritis, unspecified: Secondary | ICD-10-CM | POA: Diagnosis present

## 2019-10-07 DIAGNOSIS — D62 Acute posthemorrhagic anemia: Secondary | ICD-10-CM | POA: Diagnosis not present

## 2019-10-07 DIAGNOSIS — R2689 Other abnormalities of gait and mobility: Secondary | ICD-10-CM | POA: Diagnosis present

## 2019-10-07 DIAGNOSIS — N179 Acute kidney failure, unspecified: Secondary | ICD-10-CM

## 2019-10-07 DIAGNOSIS — K661 Hemoperitoneum: Secondary | ICD-10-CM | POA: Diagnosis not present

## 2019-10-07 DIAGNOSIS — K580 Irritable bowel syndrome with diarrhea: Secondary | ICD-10-CM | POA: Diagnosis present

## 2019-10-07 DIAGNOSIS — I69328 Other speech and language deficits following cerebral infarction: Secondary | ICD-10-CM

## 2019-10-07 DIAGNOSIS — N1831 Chronic kidney disease, stage 3a: Secondary | ICD-10-CM | POA: Diagnosis present

## 2019-10-07 DIAGNOSIS — F32A Depression, unspecified: Secondary | ICD-10-CM | POA: Diagnosis present

## 2019-10-07 DIAGNOSIS — R0989 Other specified symptoms and signs involving the circulatory and respiratory systems: Secondary | ICD-10-CM

## 2019-10-07 DIAGNOSIS — I69351 Hemiplegia and hemiparesis following cerebral infarction affecting right dominant side: Secondary | ICD-10-CM | POA: Diagnosis present

## 2019-10-07 DIAGNOSIS — I69391 Dysphagia following cerebral infarction: Secondary | ICD-10-CM

## 2019-10-07 DIAGNOSIS — D72829 Elevated white blood cell count, unspecified: Secondary | ICD-10-CM

## 2019-10-07 DIAGNOSIS — R58 Hemorrhage, not elsewhere classified: Secondary | ICD-10-CM | POA: Diagnosis not present

## 2019-10-07 DIAGNOSIS — E876 Hypokalemia: Secondary | ICD-10-CM

## 2019-10-07 MED ORDER — PROPRANOLOL HCL 60 MG PO TABS
60.0000 mg | ORAL_TABLET | Freq: Two times a day (BID) | ORAL | Status: DC
  Administered 2019-10-07 – 2019-10-29 (×41): 60 mg via ORAL
  Filled 2019-10-07 (×11): qty 1
  Filled 2019-10-07: qty 3
  Filled 2019-10-07 (×33): qty 1

## 2019-10-07 MED ORDER — ATORVASTATIN CALCIUM 80 MG PO TABS
80.0000 mg | ORAL_TABLET | Freq: Every day | ORAL | Status: DC
  Administered 2019-10-08 – 2019-10-29 (×21): 80 mg via ORAL
  Filled 2019-10-07 (×21): qty 1

## 2019-10-07 MED ORDER — CLOPIDOGREL BISULFATE 75 MG PO TABS: 75.0000 mg | ORAL_TABLET | Freq: Every day | ORAL | 0 refills | Status: DC

## 2019-10-07 MED ORDER — PHENOL 1.4 % MT LIQD: 1.0000 | OROMUCOSAL | Status: DC | PRN

## 2019-10-07 MED ORDER — POLYETHYLENE GLYCOL 3350 17 G PO PACK: 17.0000 g | PACK | Freq: Every day | ORAL | Status: DC | PRN

## 2019-10-07 MED ORDER — ASPIRIN EC 81 MG PO TBEC: 325.0000 mg | DELAYED_RELEASE_TABLET | Freq: Every day | ORAL | 0 refills | Status: DC

## 2019-10-07 MED ORDER — HEPARIN SODIUM (PORCINE) 5000 UNIT/ML IJ SOLN
5000.0000 [IU] | Freq: Three times a day (TID) | INTRAMUSCULAR | Status: DC
  Administered 2019-10-07 – 2019-10-14 (×21): 5000 [IU] via SUBCUTANEOUS
  Filled 2019-10-07 (×21): qty 1

## 2019-10-07 MED ORDER — DIAZEPAM 2 MG PO TABS
2.0000 mg | ORAL_TABLET | Freq: Two times a day (BID) | ORAL | Status: DC
  Administered 2019-10-07 – 2019-10-23 (×31): 2 mg via ORAL
  Filled 2019-10-07 (×31): qty 1

## 2019-10-07 MED ORDER — DIPHENHYDRAMINE HCL 12.5 MG/5ML PO ELIX: 12.5000 mg | ORAL_SOLUTION | Freq: Four times a day (QID) | ORAL | Status: DC | PRN

## 2019-10-07 MED ORDER — GUAIFENESIN-DM 100-10 MG/5ML PO SYRP: 5.0000 mL | ORAL_SOLUTION | Freq: Four times a day (QID) | ORAL | Status: DC | PRN

## 2019-10-07 MED ORDER — PROCHLORPERAZINE 25 MG RE SUPP: 12.5000 mg | Freq: Four times a day (QID) | RECTAL | Status: DC | PRN

## 2019-10-07 MED ORDER — TRAZODONE HCL 50 MG PO TABS
25.0000 mg | ORAL_TABLET | Freq: Every evening | ORAL | Status: DC | PRN
  Administered 2019-10-23: 50 mg via ORAL
  Filled 2019-10-07: qty 1

## 2019-10-07 MED ORDER — CLOPIDOGREL BISULFATE 75 MG PO TABS
75.0000 mg | ORAL_TABLET | Freq: Every day | ORAL | Status: DC
  Administered 2019-10-08 – 2019-10-15 (×7): 75 mg via ORAL
  Filled 2019-10-07 (×7): qty 1

## 2019-10-07 MED ORDER — ACETAMINOPHEN 325 MG PO TABS
325.0000 mg | ORAL_TABLET | ORAL | Status: DC | PRN
  Administered 2019-10-14 – 2019-10-15 (×2): 650 mg via ORAL
  Filled 2019-10-07: qty 2

## 2019-10-07 MED ORDER — PROCHLORPERAZINE MALEATE 5 MG PO TABS: 5.0000 mg | ORAL_TABLET | Freq: Four times a day (QID) | ORAL | Status: DC | PRN

## 2019-10-07 MED ORDER — PROCHLORPERAZINE EDISYLATE 10 MG/2ML IJ SOLN: 5.0000 mg | Freq: Four times a day (QID) | INTRAMUSCULAR | Status: DC | PRN

## 2019-10-07 MED ORDER — ATORVASTATIN CALCIUM 80 MG PO TABS: 80.0000 mg | ORAL_TABLET | Freq: Every day | ORAL | 0 refills | Status: DC

## 2019-10-07 MED ORDER — FLEET ENEMA 7-19 GM/118ML RE ENEM: 1.0000 | ENEMA | Freq: Once | RECTAL | Status: DC | PRN

## 2019-10-07 MED ORDER — MUPIROCIN 2 % EX OINT
1.0000 "application " | TOPICAL_OINTMENT | Freq: Two times a day (BID) | CUTANEOUS | Status: AC
  Administered 2019-10-07 – 2019-10-08 (×2): 1 via NASAL
  Filled 2019-10-07: qty 22

## 2019-10-07 MED ORDER — TRAMADOL HCL 50 MG PO TABS
50.0000 mg | ORAL_TABLET | Freq: Four times a day (QID) | ORAL | Status: DC | PRN
  Administered 2019-10-13 – 2019-10-15 (×2): 50 mg via ORAL
  Filled 2019-10-07 (×2): qty 1

## 2019-10-07 MED ORDER — ASPIRIN EC 325 MG PO TBEC
325.0000 mg | DELAYED_RELEASE_TABLET | Freq: Every day | ORAL | Status: DC
  Administered 2019-10-08 – 2019-10-14 (×6): 325 mg via ORAL
  Filled 2019-10-07 (×6): qty 1

## 2019-10-07 MED ORDER — ALUM & MAG HYDROXIDE-SIMETH 200-200-20 MG/5ML PO SUSP: 30.0000 mL | ORAL | Status: DC | PRN

## 2019-10-07 MED ORDER — BISACODYL 10 MG RE SUPP: 10.0000 mg | Freq: Every day | RECTAL | Status: DC | PRN

## 2019-10-07 MED ORDER — PANTOPRAZOLE SODIUM 40 MG PO TBEC
40.0000 mg | DELAYED_RELEASE_TABLET | Freq: Every day | ORAL | Status: DC
  Administered 2019-10-08 – 2019-10-29 (×21): 40 mg via ORAL
  Filled 2019-10-07 (×21): qty 1

## 2019-10-07 MED ORDER — FLUOXETINE HCL 10 MG PO CAPS
10.0000 mg | ORAL_CAPSULE | Freq: Every day | ORAL | Status: DC
  Administered 2019-10-08 – 2019-10-29 (×21): 10 mg via ORAL
  Filled 2019-10-07 (×21): qty 1

## 2019-10-07 NOTE — Progress Notes (Signed)
Patient arrived on the unit in bed. A&O x 4, denied pain or discomfort. Assessment done. Patient educated on POC, safety and about rehab. Bed in lowest position, call bell within reach. We continue to monitor.

## 2019-10-07 NOTE — Progress Notes (Signed)
Modified Barium Swallow Progress Note  Patient Details  Name: Elizabeth Tapia MRN: [id number] Date of Birth: 1929/07/05  Today's Date: 10/07/2019  Modified Barium Swallow completed.  Full report located under Chart Review in the Imaging Section.  Brief recommendations include the following:  Clinical Impression   Pt has a mild pharyngeal and cervical esophageal dysphagia, with oral phase relatively functional. She has reduced base of tongue retraction and anterior hyoid movement, with reduced epiglottic inversion and laryngeal vestibule closure. Her UES opening also appears to be somewhat reduced and there is an outpouching in which barium collects during the swallow. Backflow occurs between the opening of the UES and the location of this outpouching. Of note, barium was also noted to remain more distally in the esophagus in a more tortuous looking shape, with areas that also appeared to be dilated at rest.    Pt has moderate amounts of vallecular residue that does not clear her pharynx during the swallow that is more prevalent with solids than with liquids. Trace amounts of thin liquids also enter the airway with larger boluses. Aspiration occurred with a spontaneous cough response, which was also elicited when penetration reached the level of the vocal folds. Her sensation appears to be good considering this response to trace stimulation, as well as her spontaneous subswallows that she performs to reduce (but not clear) the residue from her pharynx. Pt can achieve better efficiency with cues for hard swallows, and she can protect her airway better when taking small, single sips via cup. Recommend continuing current diet with use of these swallowing strategies to maximize safety.    Swallow Evaluation Recommendations       SLP Diet Recommendations: Dysphagia 3 (Mech soft) solids;Thin liquid   Liquid Administration via: Cup;No straw   Medication Administration: Crushed with puree    Supervision: Staff to assist with self feeding   Compensations: Slow rate;Small sips/bites;Effortful swallow   Postural Changes: Seated upright at 90 degrees;Remain semi-upright after after feeds/meals (Comment)   Oral Care Recommendations: Oral care BID        Mahala Menghini., M.A. CCC-SLP Acute Rehabilitation Services Pager [phone] Office [phone]  10/07/2019,3:32 PM

## 2019-10-07 NOTE — Progress Notes (Signed)
[facility]  Subjective  - No new complaints   Objective (!) 145/68 78 98.3 F (36.8 C) (Oral) 19 97%  Intake/Output Summary (Last 24 hours) at 10/07/2019 0725 Last data filed at 10/06/2019 2145 Gross per 24 hour  Intake 150 ml  Output --  Net 150 ml    Right UE weakness Speech clear answering questions  appropriately, Alert. Moving B LE with intact sensation Neck incision healing well without hematoma  Assessment/Planning: POD # 3 left CEA  Tolerating PO's well Cont. ASA, Statin and Plavix BP control propanolol off Cleviprex  Now.  Systolic 145- 150's F/U with [facility] in 2-3 weeks  Mosetta Pigeon 10/07/2019 7:25 AM --  Laboratory Lab Results: Recent Labs    10/05/19 0501 10/06/19 0255  WBC 7.9 9.2  HGB 9.4* 9.5*  HCT 30.5* 30.0*  PLT 362 381   BMET Recent Labs    10/05/19 0501 10/06/19 0255  NA 133* 135  K 4.5 3.9  CL 100 103  CO2 19* 23  GLUCOSE 81 107*  BUN 27* 29*  CREATININE 1.40* 1.39*  CALCIUM 8.5* 8.6*    COAG Lab Results  Component Value Date   INR 1.0 10/01/2019   No results found for: PTT

## 2019-10-07 NOTE — Progress Notes (Signed)
Orthopedic Tech Progress Note Patient Details:  Elizabeth Tapia 1929-06-17 [id number] Called in order to [facility] for a REHAB COMBINATION (WHO/COCKUP/PRAFO BOOT) Patient ID: Elizabeth Tapia, female   DOB: Mar 18, 1929, 86 y.o.   MRN: [id number]   Elizabeth Tapia 10/07/2019, 5:36 PM

## 2019-10-07 NOTE — Progress Notes (Signed)
PMR Admission Coordinator Pre-Admission Assessment   Patient: Elizabeth Tapia is an 86 y.o., female MRN: [id number] DOB: Mar 24, 1929 Height: _0  (157.5 cm) Weight: 41.7 kg                                                                                                                                      Insurance Information HMO:     PPO:      PCP:      IPA:      80/20:      OTHER:  PRIMARY: Medicare A and B      Policy#: [id number]      Subscriber: pt CM Name:       Phone#:      Fax#:  Pre-Cert#: verified Civil engineer, contracting:  Benefits:  Phone #:      Name:  Eff. Date: 09/03/96 A and B     Deduct: $1484      Out of Pocket Max: n/a      Life Max: n/a CIR: 100%      SNF: 20 full days Outpatient: 80%     Co-Pay: 20% Home Health: 100%      Co-Pay:  DME: 80%     Co-Pay: 20% Providers: pt choice SECONDARY: BCBS Supplement      Policy#: [id number]      Phone#:    Financial Counselor:       Phone#:    The Engineer, petroleum" for patients in Inpatient Rehabilitation Facilities with attached "Privacy Act [place] Records" was provided and verbally reviewed with: Patient and Family   Emergency Contact Information         Contact Information     Name Relation Home Work [address]    [place], [place] Son     [phone]       Current Medical History  Patient Admitting Diagnosis: L ICA CVA History of Present Illness: Elizabeth Tapia is a 86 y.o. RH-female with history of HTN, CKD, anxiety/depression, tremors who was admitted on 09/11/2019 with dysarthria and right hemiparesis.  History taken from chart review, son, and patient due to cognitive slowing. Patient also reported having had malaise with decrease in appetite times few days.  CTA head/neck showed critical stenosis left ICA greater than 90% with string sign, hypoperfusion left parietal lobe with possible distal arterial occlusion left MCA and prominent pannus with subluxation of C1 on C2 compatible with  arthropathy and instability--?  CPPD versus RA.  MRI brain showed multiple foci of acute infarcts within left frontal and parietal lobe in watershed distribution.  She was started on heparin ggt and Dr. Doren Custard consulted for input. He recommended left carotid endarterectomy--carotid Dopplers pending.  Echocardiogram with ejection fraction of 70-75%, no wall abnormality and moderately elevated pulmonary arterial pressures.  Pt underwent carotid endarterectomy on 10/1.  PT evaluations revealed balance deficits with difficulty standing and CIR recommended to  functional decline   Complete NIHSS TOTAL: 6 Glasgow Coma Scale Score: 15   Past Medical History      Past Medical History:  Diagnosis Date  . Anxiety    . CKD (chronic kidney disease)    . Depression    . Essential tremor    . HTN (hypertension)    . IBS (irritable bowel syndrome)      with diarrhea      Family History  family history includes Alcohol abuse in her brother, father, and sister.   Prior Rehab/Hospitalizations:  Has the patient had prior rehab or hospitalizations prior to admission? No   Has the patient had major surgery during 100 days prior to admission? Yes   Current Medications    Current Facility-Administered Medications:  .   stroke: mapping our early stages of recovery book, , Does not apply, Once, Laurence Slate M, PA-C .  0.9 %  sodium chloride infusion, 500 mL, Intravenous, Once PRN, Theda Sers, Emma M, PA-C .  0.9 %  sodium chloride infusion, , Intravenous, Continuous, Collins, Emma M, PA-C, Paused at 10/05/19 1128 .  acetaminophen (TYLENOL) tablet 650 mg, 650 mg, Oral, Q4H PRN, 650 mg at 10/06/19 2115 **OR** acetaminophen (TYLENOL) 160 MG/5ML solution 650 mg, 650 mg, Per Tube, Q4H PRN **OR** acetaminophen (TYLENOL) suppository 650 mg, 650 mg, Rectal, Q4H PRN, Theda Sers, Emma M, PA-C .  alum & mag hydroxide-simeth (MAALOX/MYLANTA) 200-200-20 MG/5ML suspension 15-30 mL, 15-30 mL, Oral, Q2H PRN, Theda Sers, Emma M,  PA-C .  aspirin EC tablet 325 mg, 325 mg, Oral, Daily, Laurence Slate M, PA-C, 325 mg at 10/07/19 7591 .  atorvastatin (LIPITOR) tablet 80 mg, 80 mg, Oral, Daily, Laurence Slate M, PA-C, 80 mg at 10/07/19 0843 .  bisacodyl (DULCOLAX) EC tablet 5 mg, 5 mg, Oral, Daily PRN, Ulyses Amor, PA-C .  Chlorhexidine Gluconate Cloth 2 % PADS 6 each, 6 each, Topical, Daily, Oda Kilts, MD, 6 each at 10/07/19 [phone] .  clevidipine (CLEVIPREX) infusion 0.5 mg/mL, 1 mg/hr, Intravenous, Continuous, Collins, Emma M, PA-C, Stopped at 10/05/19 [phone] .  clopidogrel (PLAVIX) tablet 75 mg, 75 mg, Oral, Daily, Alexandria Lodge, MD, 75 mg at 10/07/19 0842 .  diazepam (VALIUM) tablet 2 mg, 2 mg, Oral, q12n4p, Collins, Emma M, PA-C, 2 mg at 10/07/19 9357 .  docusate sodium (COLACE) capsule 100 mg, 100 mg, Oral, Daily, Laurence Slate M, PA-C, 100 mg at 10/07/19 0843 .  FLUoxetine (PROZAC) capsule 10 mg, 10 mg, Oral, Daily, Laurence Slate M, PA-C, 10 mg at 10/07/19 0177 .  guaiFENesin-dextromethorphan (ROBITUSSIN DM) 100-10 MG/5ML syrup 15 mL, 15 mL, Oral, Q4H PRN, Ulyses Amor, PA-C, 15 mL at 10/06/19 2115 .  hydrALAZINE (APRESOLINE) injection 5 mg, 5 mg, Intravenous, Q20 Min PRN, Theda Sers, Emma M, PA-C .  HYDROmorphone (DILAUDID) injection 0.5-1 mg, 0.5-1 mg, Intravenous, Q2H PRN, Theda Sers, Emma M, PA-C .  labetalol (NORMODYNE) injection 10 mg, 10 mg, Intravenous, Q10 min PRN, Theda Sers, Emma M, PA-C .  magnesium sulfate IVPB 2 g 50 mL, 2 g, Intravenous, Daily PRN, Theda Sers, Emma M, PA-C .  metoprolol tartrate (LOPRESSOR) injection 2-5 mg, 2-5 mg, Intravenous, Q2H PRN, Theda Sers, Emma M, PA-C .  mupirocin ointment (BACTROBAN) 2 % 1 application, 1 application, Nasal, BID, Ulyses Amor, PA-C, 1 application at 93/90/30 [phone] .  ondansetron (ZOFRAN) injection 4 mg, 4 mg, Intravenous, Q6H PRN, Theda Sers, Emma M, PA-C .  oxyCODONE (Oxy IR/ROXICODONE) immediate release tablet 5-10 mg, 5-10 mg, Oral, Q4H PRN, Theda Sers,  Susette Racer,  PA-C .  pantoprazole (PROTONIX) EC tablet 40 mg, 40 mg, Oral, Daily, Laurence Slate M, PA-C, 40 mg at 10/07/19 1610 .  phenol (CHLORASEPTIC) mouth spray 1 spray, 1 spray, Mouth/Throat, PRN, Theda Sers, Emma M, PA-C .  potassium chloride SA (KLOR-CON) CR tablet 20-40 mEq, 20-40 mEq, Oral, Daily PRN, Theda Sers, Emma M, PA-C .  propranolol (INDERAL) tablet 60 mg, 60 mg, Oral, BID, Alexandria Lodge, MD, 60 mg at 10/07/19 0845 .  senna-docusate (Senokot-S) tablet 1 tablet, 1 tablet, Oral, QHS PRN, Ulyses Amor, PA-C   Patients Current Diet:     Diet Order                      DIET DYS 3 Room service appropriate? Yes with Assist; Fluid consistency: Thin  Diet effective now                      Precautions / Restrictions Precautions Precautions: Fall Restrictions Weight Bearing Restrictions: No    Has the patient had 2 or more falls or a fall with injury in the past year?Yes   Prior Activity Level Limited Community (1-2x/wk): family took to appointments, errands, no DME used at baseline but pt has fallen   Prior Functional Level Prior Function Level of Independence: Needs assistance Gait / Transfers Assistance Needed: in past year fell due to slip on kitchen floor; otherwise independent ADL's / Homemaking Assistance Needed: Assist from family for appts, driving, grocery shopping and sometimes medications    Self Care: Did the patient need help bathing, dressing, using the toilet or eating?  Independent   Indoor Mobility: Did the patient need assistance with walking from room to room (with or without device)? Independent   Stairs: Did the patient need assistance with internal or external stairs (with or without device)? Independent   Functional Cognition: Did the patient need help planning regular tasks such as shopping or remembering to take medications? Independent   Home Assistive Devices / Equipment Home Equipment: Cane - single point, Environmental consultant - 2 wheels, Shower seat - built in    Prior Device Use: Indicate devices/aids used by the patient prior to current illness, exacerbation or injury? none   Current Functional Level Cognition   Arousal/Alertness: Awake/alert Overall Cognitive Status: Impaired/Different from baseline Orientation Level: Oriented X4 Safety/Judgement: Decreased awareness of safety, Decreased awareness of deficits General Comments: better safety awareness (agrees to stay for rehab) Attention: Sustained Sustained Attention: Appears intact Memory: Impaired Memory Impairment: Retrieval deficit, Decreased recall of new information, Decreased short term memory Decreased Short Term Memory: Verbal basic, Functional basic Awareness: Impaired Awareness Impairment: Intellectual impairment Problem Solving: Impaired Problem Solving Impairment: Verbal basic, Functional basic Executive Function: Decision Making Decision Making: Impaired Decision Making Impairment: Verbal basic, Functional basic Behaviors: Impulsive Safety/Judgment: Impaired    Extremity Assessment (includes Sensation/Coordination)   Upper Extremity Assessment: RUE deficits/detail RUE:  (Noted tone to ROM at elbow/shoulder. R ring finger trigger) RUE Sensation: WNL RUE Coordination: decreased fine motor, decreased gross motor  Lower Extremity Assessment: RLE deficits/detail RLE Deficits / Details: pt able to isolate movements all joints; dorsiflexion results in supination; hip flexion able to raise leg off bed, knee extension grossly 4/5, ankle DF 4/5 RLE Sensation: WNL (pt denies changes)     ADLs   Overall ADL's : Needs assistance/impaired Grooming: Wash/dry face, Minimal assistance Upper Body Dressing : Moderate assistance, Cueing for sequencing, Cueing for compensatory techniques Lower Body Dressing: Maximal assistance Toilet Transfer:  Maximal assistance     Mobility   Overal bed mobility: Needs Assistance Bed Mobility: Supine to Sit Rolling: Min assist (to L side) Sidelying  to sit: Mod assist Supine to sit: Mod assist, HOB elevated Sit to supine: Min assist General bed mobility comments: assist with BLE and to elevate trunk, use of bed pad to scoot to EOB     Transfers   Overall transfer level: Needs assistance Equipment used: 2 person hand held assist Transfers: Sit to/from Stand, Stand Pivot Transfers Sit to Stand: Mod assist, +2 physical assistance Stand pivot transfers: +2 physical assistance, Mod assist General transfer comment: standing trial x 2. Pt demostrates very flexed posture, unable to correct with cues/assist hindering ability to progress gait. SPT performed bed to recliner toward left.     Ambulation / Gait / Stairs / Wheelchair Mobility   Ambulation/Gait General Gait Details: shuffle, pivotal steps toward left for transfer to recliner     Posture / Balance Dynamic Sitting Balance Sitting balance - Comments: assist to maintain balance Balance Overall balance assessment: Needs assistance Sitting-balance support: Single extremity supported, Feet unsupported Sitting balance-Leahy Scale: Poor Sitting balance - Comments: assist to maintain balance Postural control: Posterior lean Standing balance support: Bilateral upper extremity supported, During functional activity Standing balance-Leahy Scale: Poor Standing balance comment: reliant on external support     Special needs/care consideration Designated visitor son Jeneen Rinks        Previous Environmental health practitioner (from acute therapy documentation) Living Arrangements: Alone  Lives With: Family Available Help at Discharge: Family, Friend(s), Available 24 hours/day Type of Home: House Home Layout: One level Home Access: Stairs to enter Entrance Stairs-Rails: Right Entrance Stairs-Number of Steps: 1 Bathroom Shower/Tub: Walk-in shower Additional Comments: pt reports equipment was her mothers (?reliability)   Discharge Living Setting Plans for Discharge Living Setting: Lives with (comment) (son  and daughter in law) Type of Home at Discharge: House Discharge Home Layout: One level Discharge Home Access: Level entry (level through the back entrance) Discharge Bathroom Shower/Tub: Walk-in shower Discharge Bathroom Toilet: Standard Discharge Bathroom Accessibility: Yes How Accessible: Accessible via walker Does the patient have any problems obtaining your medications?: No   Social/Family/Support Systems Anticipated Caregiver: Talena Neira (son) Anticipated Caregiver's Contact Information: [phone] Ability/Limitations of Caregiver: n/a Caregiver Availability: 24/7 Discharge Plan Discussed with Primary Caregiver: Yes Is Caregiver In Agreement with Plan?: Yes Does Caregiver/Family have Issues with Lodging/Transportation while Pt is in Rehab?: No     Goals Patient/Family Goal for Rehab: PT/OT/SLP supervision Expected length of stay: 12-15 days Additional Information: Pt's son at bedside and states that he plans to move pt home with he and his wife after rehab stay.  They are retired and can provide 24/7 assist if needed. Pt/Family Agrees to Admission and willing to participate: Yes Program Orientation Provided & Reviewed with Pt/Caregiver Including Roles  & Responsibilities: Yes     Decrease burden of Care through IP rehab admission: n/a     Possible need for SNF placement upon discharge: Not anticipated. Plan to go home with her son and his wife who are retired and can provide adequate support.      Patient Condition: I have reviewed medical records from [facility], spoken with CM, and patient and son. I met with patient at the bedside for inpatient rehabilitation assessment.  Patient will benefit from ongoing PT, OT and SLP, can actively participate in 3 hours of therapy a day 5 days of the week, and can make measurable gains during  the admission.  Patient will also benefit from the coordinated team approach during an Inpatient Acute Rehabilitation admission.  The  patient will receive intensive therapy as well as Rehabilitation physician, nursing, social worker, and care management interventions.  Due to safety, skin/wound care, disease management, medication administration, pain management and patient education the patient requires 24 hour a day rehabilitation nursing.  The patient is currently mod +2 with mobility and basic ADLs.  Discharge setting and therapy post discharge at home with home health is anticipated.  Patient has agreed to participate in the Acute Inpatient Rehabilitation Program and will admit today.   Preadmission Screen Completed [id number] Lannie Fields, PT, 10/07/2019 10:57 AM ______________________________________________________________________   Discussed status with Dr. Naaman Plummer on 10/07/19 at 10:57 AM  and received approval for admission today.    Admission Coordinator:   Michel Santee, PT, DPT time 10:57 AM Sudie Grumbling 10/07/19              Cosigned by: Meredith Staggers, MD at 10/07/2019 11:16 AM

## 2019-10-07 NOTE — Plan of Care (Signed)
  Problem: Safety: Goal: Ability to remain free from injury will improve Outcome: Progressing   Problem: Skin Integrity: Goal: Risk for impaired skin integrity will decrease Outcome: Progressing   Problem: Pain Managment: Goal: General experience of comfort will improve Outcome: Progressing   Problem: Coping: Goal: Level of anxiety will decrease Outcome: Progressing   

## 2019-10-07 NOTE — H&P (Signed)
Physical Medicine and Rehabilitation Admission H&P    Chief Complaint  Patient presents with  . Cerebrovascular Accident with functional deficits.     HPI: Elizabeth Tapia is an 86 year old female with history of HTN, CKD, anxiety/depression, tremors; was admitted on 09/11/2019 with dysarthria and right hemiparesis.  Per reports patient had malaise with decrease in appetite for a few days prior to admission.  CTA head/neck showed critical stenosis L-ICA>90% with string sign, hypoperfusion left parietal lobe with possible distal arterial occlusion L-MCA and prominent pannus with subluxation of C1 on C2 compatible with arthropathy and instability--question CPPD versus RA.  MRI brain done showing multiple foci of acute infarcts within left frontal and parietal lobe in watershed distribution.  She was started on heparin and Dr. Durwin Nora was consulted for input.  He recommended left carotid endarterectomy however patient was initially reluctant to consider surgery.  2D echo done showing EF 70 to 75%, no wall abnormality and moderately elevated pulmonary arterial pressures.  Carotid Dopplers done revealing L-ICA 80-99% stenosis.   On 10/01, she is agreeable to undergo left carotid endarterectomy by Dr. Durwin Nora.  Postop on ASA 325 mg/Plavix 75 mg as well as Cleviprex for BP control.  She also had difficulty swallowing post op with a chocking episode and bedside swallow done with recommendations for dysphagia 3, with staff to assist with self feeding but reports of increased coughing with all textures today. ST plans for MBS for objective evaluation. Her anxiety/tachycardia managed with resumption of propanolol and scheduling valium bid.  Patient continues to have RUE weakness with balance deficits, difficulty with sequencing, dysphagia, aphonia and tachycardia with activity. CIR recommended due to functional decline.     Review of Systems  Constitutional: Negative for chills and fever.  HENT: Negative for  hearing loss and tinnitus.   Eyes: Negative for blurred vision and double vision.  Respiratory: Positive for cough. Negative for shortness of breath.   Cardiovascular: Negative for chest pain, palpitations and leg swelling.  Gastrointestinal: Negative for heartburn and nausea.  Genitourinary: Negative for dysuria.  Musculoskeletal: Negative for back pain, joint pain and myalgias.  Skin: Negative for rash.  Neurological: Positive for speech change and focal weakness. Negative for dizziness and headaches.  Psychiatric/Behavioral: The patient is not nervous/anxious and does not have insomnia.      Past Medical History:  Diagnosis Date  . Anxiety   . CKD (chronic kidney disease)   . Depression   . Essential tremor   . HTN (hypertension)   . IBS (irritable bowel syndrome)    with diarrhea    Past Surgical History:  Procedure Laterality Date  . EYE SURGERY      Family History  Problem Relation Age of Onset  . Alcohol abuse Father   . Alcohol abuse Sister   . Alcohol abuse Brother     Social History:  Widowed, was living alone till last year and has been independent without AD. Her grandson (disabled veteran) has been staying with her. Family provides meals/does home management. Plans for patient to move in with son in [place]. She does not use alcohol, tobacco/tobacco products or illicit drugs.    Allergies: No Known Allergies    Medications Prior to Admission  Medication Sig Dispense Refill  . acetaminophen (TYLENOL) 500 MG tablet Take 500 mg by mouth every 6 (six) hours as needed for mild pain.    . cholestyramine (QUESTRAN) 4 g packet Take 1 packet by mouth 3 (three) times daily.    Marland Kitchen  diazepam (VALIUM) 2 MG tablet Take 2 mg by mouth every 12 (twelve) hours as needed for anxiety.    Marland Kitchen FLUoxetine (PROZAC) 10 MG capsule Take 10 mg by mouth daily.    . propranolol (INDERAL) 60 MG tablet Take 60 mg by mouth 2 (two) times daily.      Drug Regimen Review  Drug regimen was  reviewed and remains appropriate with no significant issues identified  Home: Home Living Family/patient expects to be discharged to:: Private residence Living Arrangements: Alone Available Help at Discharge: Family, Friend(s), Available 24 hours/day Type of Home: House Home Access: Stairs to enter Secretary/administrator of Steps: 1 Entrance Stairs-Rails: Right Home Layout: One level Bathroom Shower/Tub: Walk-in shower Home Equipment: Medical laboratory scientific officer - single point, Environmental consultant - 2 wheels, Shower seat - built in Additional Comments: pt reports equipment was her mothers (?reliability)  Lives With: Family   Functional History: Prior Function Level of Independence: Needs assistance Gait / Transfers Assistance Needed: in past year fell due to slip on kitchen floor; otherwise independent ADL's / Homemaking Assistance Needed: Assist from family for appts, driving, grocery shopping and sometimes medications   Functional Status:  Mobility: Bed Mobility Overal bed mobility: Needs Assistance Bed Mobility: Supine to Sit Rolling: Min assist (to L side) Sidelying to sit: Mod assist Supine to sit: Mod assist, HOB elevated Sit to supine: Min assist General bed mobility comments: assist with BLE and to elevate trunk, use of bed pad to scoot to EOB Transfers Overall transfer level: Needs assistance Equipment used: 2 person hand held assist Transfers: Sit to/from Stand, [employer] Transfers Sit to Stand: Mod assist, +2 physical assistance Stand pivot transfers: +2 physical assistance, Mod assist General transfer comment: standing trial x 2. Pt demostrates very flexed posture, unable to correct with cues/assist hindering ability to progress gait. SPT performed bed to recliner toward left. Ambulation/Gait General Gait Details: shuffle, pivotal steps toward left for transfer to recliner    ADL: ADL Overall ADL's : Needs assistance/impaired Grooming: Wash/dry face, Minimal assistance Upper Body Dressing :  Moderate assistance, Cueing for sequencing, Cueing for compensatory techniques Lower Body Dressing: Maximal assistance Toilet Transfer: Maximal assistance  Cognition: Cognition Overall Cognitive Status: Impaired/Different from baseline Arousal/Alertness: Awake/alert Orientation Level: Oriented X4 Attention: Sustained Sustained Attention: Appears intact Memory: Impaired Memory Impairment: Retrieval deficit, Decreased recall of new information, Decreased short term memory Decreased Short Term Memory: Verbal basic, Functional basic Immediate Memory Recall: Sock, Blue, Bed Memory Recall Sock: With Cue Memory Recall Blue: Without Cue Memory Recall Bed: Without Cue Awareness: Impaired Awareness Impairment: Intellectual impairment Problem Solving: Impaired Problem Solving Impairment: Verbal basic, Functional basic Executive Function: Decision Making Decision Making: Impaired Decision Making Impairment: Verbal basic, Functional basic Behaviors: Impulsive Safety/Judgment: Impaired Cognition Arousal/Alertness: Awake/alert Behavior During Therapy: WFL for tasks assessed/performed Overall Cognitive Status: Impaired/Different from baseline Area of Impairment: Safety/judgement, Awareness, Problem solving Safety/Judgement: Decreased awareness of safety, Decreased awareness of deficits Awareness: Intellectual Problem Solving: Difficulty sequencing, Requires verbal cues General Comments: better safety awareness (agrees to stay for rehab)   Blood pressure (!) 144/67, pulse 81, temperature 98.2 F (36.8 C), temperature source Oral, resp. rate 20, height 5\' 2"  (1.575 m), weight 41.7 kg, SpO2 97 %. Physical Exam Vitals and nursing note reviewed.  Constitutional:      Appearance: She is underweight.     Comments: Elderly female. Up eating lunch with intermittent bouts of coughing--does better with small (1/2 tsp) bites.    HENT:     Head: Normocephalic and  atraumatic.     Left Ear: External  ear normal.     Nose: Nose normal.     Mouth/Throat:     Mouth: Mucous membranes are moist.  Eyes:     Pupils: Pupils are equal, round, and reactive to light.  Neck:     Comments: Left neck incision C/D/I with skin glue--no induration or erythema.  Cardiovascular:     Rate and Rhythm: Normal rate.     Pulses: Normal pulses.  Pulmonary:     Effort: Pulmonary effort is normal. No respiratory distress.     Breath sounds: Normal breath sounds. No wheezing.  Abdominal:     General: Bowel sounds are normal.     Palpations: Abdomen is soft.  Musculoskeletal:        General: No swelling or tenderness.     Cervical back: Normal range of motion.     Right lower leg: No edema.     Left lower leg: No edema.     Comments: Right greater than left Dupuytren's contracture.   Skin:    General: Skin is warm and dry.     Comments: Left CEA incision CDI  Neurological:     Mental Status: She is alert and oriented to person, place, and time.     Comments: Mildly dysarthric, hypophonic speech. Right central 7. RUE 1+ Shoulder ABD/ADD, 1+ EF/EE, 0 to trace wrist, HI. RLE 2+ to 3-/5 prox to distal. DTR's 2++ RUE and RLE. MAS 1/4 flexor pattern RUE and extensor pattern RLE. Senses pain and light touch in all 4's. No tremors noted today. Able to answer orientation questions and follow basic commands without difficulty.      Results for orders placed or performed during the hospital encounter of 10/01/19 (from the past 48 hour(s))  Basic metabolic panel     Status: Abnormal   Collection Time: 10/06/19  2:55 AM  Result Value Ref Range   Sodium 135 135 - 145 mmol/L   Potassium 3.9 3.5 - 5.1 mmol/L   Chloride 103 98 - 111 mmol/L   CO2 23 22 - 32 mmol/L   Glucose, Bld 107 (H) 70 - 99 mg/dL    Comment: Glucose reference range applies only to samples taken after fasting for at least 8 hours.   BUN 29 (H) 8 - 23 mg/dL   Creatinine, Ser 9.35 (H) 0.44 - 1.00 mg/dL   Calcium 8.6 (L) 8.9 - 10.3 mg/dL   GFR  calc non Af Amer 34 (L) >60 mL/min   GFR calc Af Amer 39 (L) >60 mL/min   Anion gap 9 5 - 15    Comment: Performed at [facility], [address], [place], [place] [address]  CBC with Differential/Platelet     Status: Abnormal   Collection Time: 10/06/19  2:55 AM  Result Value Ref Range   WBC 9.2 4.0 - 10.5 K/uL   RBC 3.22 (L) 3.87 - 5.11 MIL/uL   Hemoglobin 9.5 (L) 12.0 - 15.0 g/dL   HCT 93.9 (L) 36 - 46 %   MCV 93.2 80.0 - 100.0 fL   MCH 29.5 26.0 - 34.0 pg   MCHC 31.7 30.0 - 36.0 g/dL   RDW 03.0 09.2 - 33.0 %   Platelets 381 150 - 400 K/uL   nRBC 0.0 0.0 - 0.2 %   Neutrophils Relative % 59 %   Neutro Abs 5.4 1.7 - 7.7 K/uL   Lymphocytes Relative 30 %   Lymphs Abs 2.7 0.7 -  4.0 K/uL   Monocytes Relative 9 %   Monocytes Absolute 0.8 0 - 1 K/uL   Eosinophils Relative 2 %   Eosinophils Absolute 0.1 0 - 0 K/uL   Basophils Relative 0 %   Basophils Absolute 0.0 0 - 0 K/uL   Immature Granulocytes 0 %   Abs Immature Granulocytes 0.02 0.00 - 0.07 K/uL    Comment: Performed at [facility], [address] [place], [place] [address]   No results found.     Medical Problem List and Plan: 1.  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 Pt s/p left CEA.  -patient may  shower  -ELOS/Goals: 12-15 days, supervision goals with PT, OT, SLP 2.  Antithrombotics: -DVT/anticoagulation:  Pharmaceutical: Heparin cleared to resume per surgery  -antiplatelet therapy: on ASA 325/Plavix 75 mg daily.  3. Pain Management: Tylenol prn.  4. Mood: LCSW to follow for evaluation and support.   -antipsychotic agents: N/A 5. Neuropsych: This patient is capable of making decisions on her own behalf. 6. Skin/Wound Care: Monitor incision for healing. Routine pressure relief measures.  7. Fluids/Electrolytes/Nutrition: Monitor I/O. Check lytes in am.  8. HTN: Monitor BP tid--on inderal 60 mg bid 9. CKD: Encourage  fluid intake. Will recheck CBC in am.  10. ABLA: Likely post op with drop 11.2-->9.5. Will continue to monitor H/H with serial checks.  11. Anxiety/depression:  Continue Prozac. On valium bid to help manage anxiety state.  12. IBS--diarrhea: Questran on hold     Jacquelynn Cree, PA-C 10/07/2019

## 2019-10-07 NOTE — Progress Notes (Signed)
°  Speech Language Pathology Treatment: Dysphagia  Patient Details Name: Elizabeth Tapia MRN: [id number] DOB: 14-Jun-1929 Today's Date: 10/07/2019 Time: 3212-2482 SLP Time Calculation (min) (ACUTE ONLY): 20 min  Assessment / Plan / Recommendation Clinical Impression  Pt presents with more coughing today compared to SLP visit on Saturday, although RN reports no difficulties this morning with purees and thin liquids from breakfast. Coughing is noted regardless of consistency present, and although it sounds strong, it is also prolonged. SLP assisted with suction x1 but trace amounts of secretions were removed - perhaps just what was in her oral cavity. Given the inconsistent signs of dysphagia still noted at bedside, would still plan to proceed with MBS. Pt and son are in agreement, as they are also noting coughing during meals that is less overall on mechanical soft diet, but still increased from baseline. Will complete as soon as can be scheduled - likely next date per radiology.    HPI HPI:  Elizabeth Tapia is an 86 year old female with a history of hypertension, hyperlipidemia, CKD IIIa, ?rheumatoid arthritis, anxiety, and depression who presents with an acute stroke. Patient was in her usual state of health last night and awoke this morning with slurred speech and R sided weakness. She was unable to walk. She lives with her grandson and called to him for help. She has no history of prior stroke or known vascular disease.   10/01/19 MRI head indicated: This demonstrates multiple foci of acute infarcts within the left frontal and parietal lobes in a watershed distribution.  Underwent left CEA 10/04/19.       SLP Plan  Continue with current plan of care       Recommendations  Diet recommendations: Dysphagia 3 (mechanical soft);Thin liquid Liquids provided via: Cup;Straw Medication Administration: Crushed with puree Supervision: Staff to assist with self feeding Compensations: Slow rate;Small  sips/bites;Follow solids with liquid Postural Changes and/or Swallow Maneuvers: Seated upright 90 degrees;Upright 30-60 min after meal                Oral Care Recommendations: Oral care BID Follow up Recommendations: Inpatient Rehab SLP Visit Diagnosis: Dysphagia, oropharyngeal phase (R13.12) Plan: Continue with current plan of care       GO                Elizabeth Tapia., M.A. Elizabeth Tapia Acute Rehabilitation Services Pager [phone] Office [phone]  10/07/2019, 10:14 AM

## 2019-10-07 NOTE — Progress Notes (Signed)
Physical Medicine and Rehabilitation Consult     Reason for Consult: Stroke with functional deficits.  Referring Physician: Dr. Sandre Kitty.    HPI: Elizabeth Tapia is a 86 y.o. RH-female with history of HTN, CKD, anxiety/depression, tremors who was admitted on 09/11/2019 with dysarthria and right hemiparesis.  History taken from chart review, son, and patient due to cognitive slowing. patient also reported having had malaise with decrease in appetite times few days.  CTA head/neck showed critical stenosis left ICA greater than 90% with string sign, hypoperfusion left parietal lobe with possible distal arterial occlusion left MCA and prominent pannus with subluxation of C1 on C2 compatible with arthropathy and instability--?  CPPD versus RA.  MRI brain showed multiple foci of acute infarcts within left frontal and parietal lobe in watershed distribution.  She was started on heparin ggt and Dr. Durwin Nora consulted for input. He recommended left carotid endarterectomy--carotid Dopplers pending.  Patient currently with reluctant to consider surgery.  Echocardiogram with ejection fraction of 70-75%, no wall abnormality and moderately elevated pulmonary arterial pressures.  PT evaluations revealed balance deficits with difficulty standing and CIR recommended to functional decline   Grandson (disabled veteran) has been living with patient for the past year--is there most of the time. Family stopped her driving early this year. Family provides meals and manages home. Plans for ILF or moving in with son after discharge.    Review of Systems  Constitutional: Negative for chills and fever.  HENT: Negative for hearing loss.   Eyes: Negative for blurred vision and double vision.  Respiratory: Negative for cough and shortness of breath.   Cardiovascular: Negative for chest pain and palpitations.  Gastrointestinal: Negative for constipation, heartburn and nausea.  Genitourinary: Negative for dysuria and  urgency.  Musculoskeletal: Negative for back pain, falls, joint pain, myalgias and neck pain.  Skin: Negative for rash.  Neurological: Positive for dizziness (with therapy today), sensory change (right hand), speech change, focal weakness and weakness. Negative for headaches.  Psychiatric/Behavioral: Negative for memory loss.  All other systems reviewed and are negative.       Past Medical History:  Diagnosis Date  . Anxiety    . CKD (chronic kidney disease)    . Depression    . Essential tremor    . HTN (hypertension)    . IBS (irritable bowel syndrome)      with diarrhea           Past Surgical History:  Procedure Laterality Date  . EYE SURGERY               Family History  Problem Relation Age of Onset  . Alcohol abuse Father    . Alcohol abuse Sister    . Alcohol abuse Brother        Social History: Lives alone and independent without AD. She does not use tobacco, alcohol or illicit drugs.      Allergies: No Known Allergies            Medications Prior to Admission  Medication Sig Dispense Refill  . acetaminophen (TYLENOL) 500 MG tablet Take 500 mg by mouth every 6 (six) hours as needed for mild pain.      . cholestyramine (QUESTRAN) 4 g packet Take 1 packet by mouth 3 (three) times daily.      . diazepam (VALIUM) 2 MG tablet Take 2 mg by mouth every 12 (twelve) hours as needed for anxiety.      Marland Kitchen  FLUoxetine (PROZAC) 10 MG capsule Take 10 mg by mouth daily.      . propranolol (INDERAL) 60 MG tablet Take 60 mg by mouth 2 (two) times daily.          Home: Home Living Family/patient expects to be discharged to:: Private residence Living Arrangements: Alone Available Help at Discharge: Family, Available PRN/intermittently (grandson stays most nights; son (retired) checks in most day) Type of Home: House Home Access: Stairs to enter Secretary/administrator of Steps: 1 Entrance Stairs-Rails: Right Home Layout: One level Bathroom Shower/Tub: Walk-in shower Home  Equipment: Medical laboratory scientific officer - single point, Environmental consultant - 2 wheels, Shower seat - built in Additional Comments: pt reports equipment was her mothers (?reliability)  Functional History: Prior Function Level of Independence: Needs assistance Gait / Transfers Assistance Needed: in past year fell due to slip on kitchen floor; otherwise independent ADL's / Homemaking Assistance Needed: does not drive; family does grocery shopping and cleaning house Functional Status:  Mobility: Bed Mobility Overal bed mobility: Needs Assistance Bed Mobility: Supine to Sit, Sit to Supine, Rolling Rolling: Supervision (to rt) Supine to sit: Mod assist Sit to supine: Min assist General bed mobility comments: HOB flat, no rail; pt able to scoot laterally in supine to her left, unable to scoot to her right (repeatedly rolls rt instead); able to advance legs off left EOB with incr time for RLE, pulls to sitting, however with posterior LOB wwhen attempting to seated scoot to EOB and get feet to the floor required mod assist  Transfers Overall transfer level: Needs assistance Equipment used:  (bed rail on her left) Transfers: Sit to/from Stand Sit to Stand: Mod assist General transfer comment: RLE tended to abduct and required assist for balance and to adduct RLE into weightbearing position to allow side step with LLE Ambulation/Gait General Gait Details: side step x 2 to pt's left only due to elevated HR and will need +2 assist for lines   ADL:   Cognition: Cognition Overall Cognitive Status: No family/caregiver present to determine baseline cognitive functioning Orientation Level: Oriented X4 Cognition Arousal/Alertness: Awake/alert Behavior During Therapy: WFL for tasks assessed/performed Overall Cognitive Status: No family/caregiver present to determine baseline cognitive functioning Area of Impairment: Safety/judgement, Awareness Safety/Judgement: Decreased awareness of safety, Decreased awareness of deficits Awareness:  Intellectual General Comments: Patient able to state her rt side is weak, however also states she can go home and work on getting better--doesn't want to stay in the hospital   Blood pressure 124/76, pulse (!) 103, temperature 98.2 F (36.8 C), temperature source Oral, resp. rate 20, height 5\' 2"  (1.575 m), weight 41.7 kg, SpO2 98 %. Physical Exam Vitals and nursing note reviewed.  Constitutional:      Appearance: Normal appearance. She is underweight.     Comments: Frail  HENT:     Head: Normocephalic and atraumatic.     Right Ear: External ear normal.     Left Ear: External ear normal.     Nose: Nose normal.  Eyes:     General:        Right eye: No discharge.        Left eye: No discharge.     Extraocular Movements: Extraocular movements intact.  Cardiovascular:     Rate and Rhythm: Regular rhythm. Tachycardia present.  Pulmonary:     Effort: Pulmonary effort is normal. No respiratory distress.     Breath sounds: Normal breath sounds. No stridor.  Abdominal:     General: Abdomen is flat. Bowel sounds  are normal. There is no distension.  Musculoskeletal:     Cervical back: Normal range of motion.     Comments: No edema or tenderness in extremities  Skin:    General: Skin is warm and dry.     Coloration: Skin is not jaundiced.  Neurological:     Mental Status: She is alert and oriented to person, place, and time.     Comments: Mild right facial droop with mildly ataxic speech. HOH Intentional tremors noted.  Motor: LUE/LLE: 4/5 proximal distal RUE: Shoulder abduction 2/5, elbow flexion/extension 2+/5, distally 0/5 Right lower extremity: Hip flexion, knee extension 3/5, ankle dorsiflexion 4+/5  Psychiatric:        Mood and Affect: Affect is blunt and flat.        Speech: Speech is delayed.        Lab Results Last 24 Hours       Results for orders placed or performed during the hospital encounter of 10/01/19 (from the past 24 hour(s))  Respiratory Panel by RT PCR (Flu  A&B, Covid) - Nasopharyngeal Swab     Status: None    Collection Time: 10/01/19 12:40 PM    Specimen: Nasopharyngeal Swab  Result Value Ref Range    SARS Coronavirus 2 by RT PCR NEGATIVE NEGATIVE    Influenza A by PCR NEGATIVE NEGATIVE    Influenza B by PCR NEGATIVE NEGATIVE  CBC     Status: Abnormal    Collection Time: 10/02/19  6:25 AM  Result Value Ref Range    WBC 6.3 4.0 - 10.5 K/uL    RBC 3.62 (L) 3.87 - 5.11 MIL/uL    Hemoglobin 10.7 (L) 12.0 - 15.0 g/dL    HCT 16.1 (L) 36 - 46 %    MCV 93.1 80.0 - 100.0 fL    MCH 29.6 26.0 - 34.0 pg    MCHC 31.8 30.0 - 36.0 g/dL    RDW 09.6 04.5 - 40.9 %    Platelets 345 150 - 400 K/uL    nRBC 0.0 0.0 - 0.2 %  Comprehensive metabolic panel     Status: Abnormal    Collection Time: 10/02/19  6:25 AM  Result Value Ref Range    Sodium 134 (L) 135 - 145 mmol/L    Potassium 4.5 3.5 - 5.1 mmol/L    Chloride 101 98 - 111 mmol/L    CO2 21 (L) 22 - 32 mmol/L    Glucose, Bld 82 70 - 99 mg/dL    BUN 22 8 - 23 mg/dL    Creatinine, Ser 8.11 (H) 0.44 - 1.00 mg/dL    Calcium 9.5 8.9 - 91.4 mg/dL    Total Protein 7.0 6.5 - 8.1 g/dL    Albumin 3.4 (L) 3.5 - 5.0 g/dL    AST 22 15 - 41 U/L    ALT 15 0 - 44 U/L    Alkaline Phosphatase 69 38 - 126 U/L    Total Bilirubin 1.0 0.3 - 1.2 mg/dL    GFR calc non Af Amer 36 (L) >60 mL/min    GFR calc Af Amer 41 (L) >60 mL/min    Anion gap 12 5 - 15  Lipid panel     Status: Abnormal    Collection Time: 10/02/19  6:25 AM  Result Value Ref Range    Cholesterol 214 (H) 0 - 200 mg/dL    Triglycerides 75 <782 mg/dL    HDL 44 >95 mg/dL    Total CHOL/HDL Ratio 4.9  RATIO    VLDL 15 0 - 40 mg/dL    LDL Cholesterol 161 (H) 0 - 99 mg/dL       Imaging Results (Last 48 hours)  MR BRAIN WO CONTRAST   Result Date: 10/01/2019 CLINICAL DATA:  Neuro deficit, acute stroke suspected. EXAM: MRI HEAD WITHOUT CONTRAST TECHNIQUE: Only DWI and ADC imaging was performed given patient intolerance. COMPARISON:  Same day code  stroke. FINDINGS: Only DWI and ADC imaging was performed given patient intolerance. This demonstrates multiple foci of acute infarcts within the left frontal and parietal lobe cortex and subcortical white matter. The most confluent area of acute infarct is in the left parietal lobe (distal left MCA territory) correlates with the hypodensity seen on CT. No evidence of hydrocephalus. IMPRESSION: Only DWI and ADC imaging was performed given patient intolerance. This demonstrates multiple foci of acute infarcts within the left frontal and parietal lobes in a watershed distribution. The most confluent area of acute infarct is in the left parietal lobe (distal left MCA territory) and correlates with the hypodensity seen on CT. Electronically Signed   By: Feliberto Harts MD   On: 10/01/2019 17:05    CT CEREBRAL PERFUSION W CONTRAST   Result Date: 10/01/2019 CLINICAL DATA:  Acute neuro deficit. Right facial droop and right-sided weakness. Slurred speech. EXAM: CT ANGIOGRAPHY HEAD AND NECK TECHNIQUE: Multidetector CT imaging of the head and neck was performed using the standard protocol during bolus administration of intravenous contrast. Multiplanar CT image reconstructions and MIPs were obtained to evaluate the vascular anatomy. Carotid stenosis measurements (when applicable) are obtained utilizing NASCET criteria, using the distal internal carotid diameter as the denominator. CONTRAST:  OMNIPAQUE IOHEXOL 350 MG/ML SOLN COMPARISON:  CT head 10/01/2019 FINDINGS: CTA NECK FINDINGS Aortic arch: Atherosclerotic aortic arch. Proximal great vessels widely patent. 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 Right carotid system: Mild atherosclerotic disease right carotid bifurcation without significant stenosis. Left carotid system: High-grade stenosis proximal left internal carotid artery with irregular lumen. This may be soft plaque or plaque rupture. This could be a source of emboli. Moderate  stenosis proximal left external carotid artery. Vertebral arteries: Both vertebral arteries widely patent to the basilar without stenosis. Skeleton: Cervical spondylosis.  No acute skeletal abnormality. Other neck: Prominent pannus posterior to the dens compatible with arthropathy. This is causing mild flattening of the ventral cord and mild spinal stenosis. Rule out rheumatoid arthritis or CPPD. No bony erosion identified. C1 is subluxed to the right which is likely due to ligament laxity. No fracture identified. Upper chest: Apical scarring bilaterally. No acute abnormality in the lung apices. Review of the MIP images confirms the above findings CTA HEAD FINDINGS Anterior circulation: Mild atherosclerotic disease in the cavernous carotid bilaterally without significant stenosis. Right middle cerebral artery widely patent. Hypoplastic left A1 segment. Both anterior cerebral arteries are patent from the right. Left M1 segment widely patent. No large vessel occlusion. There is an area of hypoperfusion in the left parietal lobe where there is an area of infarct on CT. This could be acute. Posterior circulation: Both vertebral arteries patent to the basilar. PICA patent bilaterally. Basilar widely patent. Right AICA patent. Superior cerebellar and posterior cerebral arteries patent bilaterally without stenosis or large vessel occlusion. Venous sinuses: Normal venous enhancement. Anatomic variants: None Review of the MIP images confirms the above findings IMPRESSION: 1. 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 String sinus present with plaque irregularity. Possible source of emboli. 2. Area  of hypoperfusion left parietal lobe on CTA corresponding to an area of hypodensity on CT. Possible distal arterial occlusion left MCA territory. I am not able to identify definite acute vessel occlusion. 3. Prominent pannus posterior to the dens with subluxation of C1 on C2 compatible with arthropathy  and instability. Possible CPPD or rheumatoid arthritis. 4. These results were called by telephone at the time of interpretation on 10/01/2019 at 11:18 am to provider [facility] , who verbally acknowledged these results. Electronically Signed   By: Marlan Palau M.D.   On: 10/01/2019 11:19    ECHOCARDIOGRAM COMPLETE   Result Date: 10/02/2019    ECHOCARDIOGRAM REPORT   Patient Name:   Elizabeth Tapia Date of Exam: 10/02/2019 Medical Rec #:  [id number]      Height:       62.0 in Accession #:    [id number]     Weight:       92.0 lb Date of Birth:  1929/12/21      BSA:          1.374 m Patient Age:    89 years       BP:           144/72 mmHg Patient Gender: F              HR:           113 bpm. Exam Location:  Inpatient Procedure: 2D Echo, 3D Echo, Cardiac Doppler and Color Doppler Indications:     Stroke  History:         Patient has no prior history of Echocardiogram examinations.                  Abnormal ECG; Risk Factors:Hypertension and Dyslipidemia. No                  prior cardiac history. Kidney disorder.  Sonographer:     Sheralyn Boatman RDCS Referring Phys:  [id number] Cathren Laine Diagnosing Phys: Donato Schultz MD  Sonographer Comments: Suboptimal parasternal window. IMPRESSIONS  1. Left ventricular ejection fraction, by estimation, is 70 to 75%. Left ventricular ejection fraction by 3D volume is 77 %. The left ventricle has hyperdynamic function. The left ventricle has no regional wall motion abnormalities. There is moderate asymmetric left ventricular hypertrophy of the basal-septal segment. Left ventricular diastolic parameters are consistent with Grade I diastolic dysfunction (impaired relaxation).  2. Right ventricular systolic function is normal. The right ventricular size is normal. There is moderately elevated pulmonary artery systolic pressure. The estimated right ventricular systolic pressure is 49.2 mmHg.  3. The mitral valve is normal in structure. No evidence of mitral valve regurgitation. No evidence of  mitral stenosis.  4. The aortic valve is normal in structure. Aortic valve regurgitation is trivial. No aortic stenosis is present.  5. There is mild (Grade II) plaque.  6. The inferior vena cava is normal in size with greater than 50% respiratory variability, suggesting right atrial pressure of 3 mmHg. Conclusion(s)/Recommendation(s): No intracardiac source of embolism detected on this transthoracic study. A transesophageal echocardiogram is recommended to exclude cardiac source of embolism if clinically indicated. FINDINGS  Left Ventricle: Left ventricular ejection fraction, by estimation, is 70 to 75%. Left ventricular ejection fraction by 3D volume is 77 %. The left ventricle has hyperdynamic function. The left ventricle has no regional wall motion abnormalities. The left ventricular internal cavity size was normal in size. There is moderate asymmetric left ventricular hypertrophy of the basal-septal segment. Left ventricular  diastolic parameters are consistent with Grade I diastolic dysfunction (impaired relaxation). Right Ventricle: The right ventricular size is normal. No increase in right ventricular wall thickness. Right ventricular systolic function is normal. There is moderately elevated pulmonary artery systolic pressure. The tricuspid regurgitant velocity is 3.40 m/s, and with an assumed right atrial pressure of 3 mmHg, the estimated right ventricular systolic pressure is 49.2 mmHg. Left Atrium: Left atrial size was normal in size. Right Atrium: Right atrial size was normal in size. Pericardium: There is no evidence of pericardial effusion. Mitral Valve: The mitral valve is normal in structure. No evidence of mitral valve regurgitation. No evidence of mitral valve stenosis. MV peak gradient, 10.2 mmHg. The mean mitral valve gradient is 2.0 mmHg. Tricuspid Valve: The tricuspid valve is normal in structure. Tricuspid valve regurgitation is mild . No evidence of tricuspid stenosis. Aortic Valve: The aortic  valve is normal in structure. Aortic valve regurgitation is trivial. No aortic stenosis is present. Pulmonic Valve: The pulmonic valve was normal in structure. Pulmonic valve regurgitation is not visualized. No evidence of pulmonic stenosis. Aorta: The aortic root is normal in size and structure. There is mild (Grade II) plaque. Venous: The inferior vena cava is normal in size with greater than 50% respiratory variability, suggesting right atrial pressure of 3 mmHg. IAS/Shunts: No atrial level shunt detected by color flow Doppler.  LEFT VENTRICLE PLAX 2D LVIDd:         2.20 cm         Diastology LVIDs:         1.50 cm         LV e' medial:    6.20 cm/s LV PW:         0.80 cm         LV E/e' medial:  13.4 LV IVS:        1.50 cm         LV e' lateral:   13.95 cm/s LVOT diam:     2.00 cm         LV E/e' lateral: 6.0 LV SV:         54 LV SV Index:   40 LVOT Area:     3.14 cm        3D Volume EF                                LV 3D EF:    Left                                             ventricular LV Volumes (MOD)                            ejection LV vol d, MOD    43.6 ml                    fraction by A2C:                                        3D volume LV vol d, MOD    39.0 ml  is 77 %. A4C: LV vol s, MOD    12.8 ml A2C:                           3D Volume EF: LV vol s, MOD    11.5 ml       3D EF:        77 % A4C: LV SV MOD A2C:   30.8 ml LV SV MOD A4C:   39.0 ml LV SV MOD BP:    30.3 ml RIGHT VENTRICLE             IVC RV S prime:     18.00 cm/s  IVC diam: 1.00 cm TAPSE (M-mode): 2.2 cm LEFT ATRIUM           Index       RIGHT ATRIUM          Index LA diam:      2.80 cm 2.04 cm/m  RA Area:     5.90 cm LA Vol (A2C): 23.6 ml 17.17 ml/m RA Volume:   10.30 ml 7.49 ml/m LA Vol (A4C): 11.2 ml 8.15 ml/m  AORTIC VALVE LVOT Vmax:   106.00 cm/s LVOT Vmean:  62.500 cm/s LVOT VTI:    0.173 m  AORTA Ao Root diam: 3.00 cm MITRAL VALVE                TRICUSPID VALVE MV Area (PHT): 5.84 cm     TR Peak grad:    46.2 mmHg MV Peak grad:  10.2 mmHg    TR Vmax:        340.00 cm/s MV Mean grad:  2.0 mmHg MV Vmax:       1.60 m/s     SHUNTS MV Vmean:      63.9 cm/s    Systemic VTI:  0.17 m MV Decel Time: 130 msec     Systemic Diam: 2.00 cm MV E velocity: 83.20 cm/s MV A velocity: 148.00 cm/s MV E/A ratio:  0.56 Donato Schultz MD Electronically signed by Donato Schultz MD Signature Date/Time: 10/02/2019/11:05:51 AM    Final (Updated)     CT HEAD CODE STROKE WO CONTRAST   Result Date: 10/01/2019 CLINICAL DATA:  Code stroke. Acute neuro deficit. Right facial droop and right-sided weakness. Slurred speech. EXAM: CT HEAD WITHOUT CONTRAST TECHNIQUE: Contiguous axial images were obtained from the base of the skull through the vertex without intravenous contrast. COMPARISON:  None. FINDINGS: Brain: Generalized atrophy. Extensive white matter hypodensity bilaterally. Hypodensity in the left parietal compatible with infarct possibly acute or subacute. This is approximately 2 cm in size. Small area of increased density within the hypodensity could represent acute hemorrhage. Vascular: Negative for hyperdense vessel Skull: Negative Sinuses/Orbits: Chronic bony thickening of the sphenoid sinus with associated air-fluid level. Remaining sinuses clear. Bilateral cataract extraction. Other: None ASPECTS ([place] Stroke Program Early CT Score) - Ganglionic level infarction (caudate, lentiform nuclei, internal capsule, insula, M1-M3 cortex): 7 - Supraganglionic infarction (M4-M6 cortex): 2 Total score (0-10 with 10 being normal): 9 IMPRESSION: 1. Hypodensity left parietal cortex compatible with infarction which could be acute or subacute. Small associated hyperdensity could represent hemorrhage. No prior studies for comparison 2. Generalized atrophy with extensive chronic microvascular ischemic change in the white matter 3. ASPECTS is 9 4. Code stroke imaging results were communicated on 10/01/2019 at 10:47 am to provider Thomasena Edis via Loretha Stapler  Electronically Signed   By: Marlan Palau M.D.   On:  10/01/2019 10:51    CT ANGIO HEAD CODE STROKE   Result Date: 10/01/2019 CLINICAL DATA:  Acute neuro deficit. Right facial droop and right-sided weakness. Slurred speech. EXAM: CT ANGIOGRAPHY HEAD AND NECK TECHNIQUE: Multidetector CT imaging of the head and neck was performed using the standard protocol during bolus administration of intravenous contrast. Multiplanar CT image reconstructions and MIPs were obtained to evaluate the vascular anatomy. Carotid stenosis measurements (when applicable) are obtained utilizing NASCET criteria, using the distal internal carotid diameter as the denominator. CONTRAST:  OMNIPAQUE IOHEXOL 350 MG/ML SOLN COMPARISON:  CT head 10/01/2019 FINDINGS: CTA NECK FINDINGS Aortic arch: Atherosclerotic aortic arch. Proximal great vessels widely patent. 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 Right carotid system: Mild atherosclerotic disease right carotid bifurcation without significant stenosis. Left carotid system: High-grade stenosis proximal left internal carotid artery with irregular lumen. This may be soft plaque or plaque rupture. This could be a source of emboli. Moderate stenosis proximal left external carotid artery. Vertebral arteries: Both vertebral arteries widely patent to the basilar without stenosis. Skeleton: Cervical spondylosis.  No acute skeletal abnormality. Other neck: Prominent pannus posterior to the dens compatible with arthropathy. This is causing mild flattening of the ventral cord and mild spinal stenosis. Rule out rheumatoid arthritis or CPPD. No bony erosion identified. C1 is subluxed to the right which is likely due to ligament laxity. No fracture identified. Upper chest: Apical scarring bilaterally. No acute abnormality in the lung apices. Review of the MIP images confirms the above findings CTA HEAD FINDINGS Anterior circulation: Mild atherosclerotic disease in the cavernous  carotid bilaterally without significant stenosis. Right middle cerebral artery widely patent. Hypoplastic left A1 segment. Both anterior cerebral arteries are patent from the right. Left M1 segment widely patent. No large vessel occlusion. There is an area of hypoperfusion in the left parietal lobe where there is an area of infarct on CT. This could be acute. Posterior circulation: Both vertebral arteries patent to the basilar. PICA patent bilaterally. Basilar widely patent. Right AICA patent. Superior cerebellar and posterior cerebral arteries patent bilaterally without stenosis or large vessel occlusion. Venous sinuses: Normal venous enhancement. Anatomic variants: None Review of the MIP images confirms the above findings IMPRESSION: 1. 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 String sinus present with plaque irregularity. Possible source of emboli. 2. Area of hypoperfusion left parietal lobe on CTA corresponding to an area of hypodensity on CT. Possible distal arterial occlusion left MCA territory. I am not able to identify definite acute vessel occlusion. 3. Prominent pannus posterior to the dens with subluxation of C1 on C2 compatible with arthropathy and instability. Possible CPPD or rheumatoid arthritis. 4. These results were called by telephone at the time of interpretation on 10/01/2019 at 11:18 am to provider [facility] , who verbally acknowledged these results. Electronically Signed   By: Marlan Palau M.D.   On: 10/01/2019 11:19    CT ANGIO NECK CODE STROKE   Result Date: 10/01/2019 CLINICAL DATA:  Acute neuro deficit. Right facial droop and right-sided weakness. Slurred speech. EXAM: CT ANGIOGRAPHY HEAD AND NECK TECHNIQUE: Multidetector CT imaging of the head and neck was performed using the standard protocol during bolus administration of intravenous contrast. Multiplanar CT image reconstructions and MIPs were obtained to evaluate the vascular anatomy. Carotid  stenosis measurements (when applicable) are obtained utilizing NASCET criteria, using the distal internal carotid diameter as the denominator. CONTRAST:  OMNIPAQUE IOHEXOL 350 MG/ML SOLN COMPARISON:  CT head 10/01/2019 FINDINGS:  CTA NECK FINDINGS Aortic arch: Atherosclerotic aortic arch. Proximal great vessels widely patent. 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 Right carotid system: Mild atherosclerotic disease right carotid bifurcation without significant stenosis. Left carotid system: High-grade stenosis proximal left internal carotid artery with irregular lumen. This may be soft plaque or plaque rupture. This could be a source of emboli. Moderate stenosis proximal left external carotid artery. Vertebral arteries: Both vertebral arteries widely patent to the basilar without stenosis. Skeleton: Cervical spondylosis.  No acute skeletal abnormality. Other neck: Prominent pannus posterior to the dens compatible with arthropathy. This is causing mild flattening of the ventral cord and mild spinal stenosis. Rule out rheumatoid arthritis or CPPD. No bony erosion identified. C1 is subluxed to the right which is likely due to ligament laxity. No fracture identified. Upper chest: Apical scarring bilaterally. No acute abnormality in the lung apices. Review of the MIP images confirms the above findings CTA HEAD FINDINGS Anterior circulation: Mild atherosclerotic disease in the cavernous carotid bilaterally without significant stenosis. Right middle cerebral artery widely patent. Hypoplastic left A1 segment. Both anterior cerebral arteries are patent from the right. Left M1 segment widely patent. No large vessel occlusion. There is an area of hypoperfusion in the left parietal lobe where there is an area of infarct on CT. This could be acute. Posterior circulation: Both vertebral arteries patent to the basilar. PICA patent bilaterally. Basilar widely patent. Right AICA patent. Superior  cerebellar and posterior cerebral arteries patent bilaterally without stenosis or large vessel occlusion. Venous sinuses: Normal venous enhancement. Anatomic variants: None Review of the MIP images confirms the above findings IMPRESSION: 1. 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 String sinus present with plaque irregularity. Possible source of emboli. 2. Area of hypoperfusion left parietal lobe on CTA corresponding to an area of hypodensity on CT. Possible distal arterial occlusion left MCA territory. I am not able to identify definite acute vessel occlusion. 3. Prominent pannus posterior to the dens with subluxation of C1 on C2 compatible with arthropathy and instability. Possible CPPD or rheumatoid arthritis. 4. These results were called by telephone at the time of interpretation on 10/01/2019 at 11:18 am to provider [facility] , who verbally acknowledged these results. Electronically Signed   By: Marlan Palau M.D.   On: 10/01/2019 11:19       Assessment/Plan: Diagnosis: left frontal and parietal lobe in watershed distribution.   Stroke: Continue secondary stroke prophylaxis and Risk Factor Modification listed below:   Antiplatelet therapy:   Blood Pressure Management:  Continue current medication with prn's with permisive HTN per primary team Statin Agent:   Prediabetes management:   Right sided hemiparesis: fit for orthosis to prevent contractures (resting hand splint for day, wrist cock up splint at night, etc) PT/OT for mobility, ADL training  Motor recovery: Fluoxetine Labs independently reviewed.  Records reviewed and summated above.   1. Does the need for close, 24 hr/day medical supervision in concert with the patient's rehab needs make it unreasonable for this patient to be served in a less intensive setting? Yes  2. Co-Morbidities requiring supervision/potential complications: HTN (monitor and provide prns in accordance with increased physical  exertion and pain), anxiety/depression (ensure anxiety and resulting apprehension do not limit functional progress; consider prn medications if warranted), tremors, CKD (avoid nephrotoxic meds, repeat labs), prediabetes (Monitor in accordance with exercise and adjust meds as necessary) 3. Due to safety, disease management, medication administration and patient education, does the patient require 24 hr/day rehab  nursing? Yes 4. Does the patient require coordinated care of a physician, rehab nurse, therapy disciplines of PT/OT/SLP to address physical and functional deficits in the context of the above medical diagnosis(es)? Yes Addressing deficits in the following areas: balance, endurance, locomotion, strength, transferring, bowel/bladder control, bathing, dressing, toileting, cognition and psychosocial support 5. Can the patient actively participate in an intensive therapy program of at least 3 hrs of therapy per day at least 5 days per week? Yes 6. The potential for patient to make measurable gains while on inpatient rehab is excellent 7. Anticipated functional outcomes upon discharge from inpatient rehab are supervision and min assist  with PT, supervision and min assist with OT, modified independent with SLP. 8. Estimated rehab length of stay to reach the above functional goals is: 10-15 days. 9. Anticipated discharge destination: Home 10. Overall Rehab/Functional Prognosis: good   RECOMMENDATIONS: This patient's condition is appropriate for continued rehabilitative care in the following setting: CIR after completion of medical work-up. Patient has agreed to participate in recommended program. Yes Note that insurance prior authorization may be required for reimbursement for recommended care.   Comment: Rehab Admissions Coordinator to follow up.   I have personally performed a face to face diagnostic evaluation, including, but not limited to relevant history and physical exam findings, of this  patient and developed relevant assessment and plan.  Additionally, I have reviewed and concur with the physician assistant's documentation above.    Maryla Morrow, MD, ABPMR Jacquelynn Cree, PA-C

## 2019-10-07 NOTE — TOC Transition Note (Signed)
Transition of Care (TOC) - CM/SW Discharge Note Donn Pierini RN,BSN Transitions of Care Unit 4NP (non trauma) - RN Case Manager See Treatment Team for direct Phone #   Patient Details  Name: Elizabeth Tapia MRN: [id number] Date of Birth: 1929/08/28  Transition of Care [facility] CM/SW Contact:  Darrold Span, RN Phone Number: 10/07/2019, 4:14 PM   Clinical Narrative:    Notified by Luther Parody with [facility] INPT rehab pt will have bed available today for admission to INPT rehab- pt has been cleared for INPT rehab- to transition today.    Final next level of care: IP Rehab Facility Barriers to Discharge: Barriers Resolved   Patient Goals and CMS Choice Patient states their goals for this hospitalization and ongoing recovery are:: rehab CMS Medicare.gov Compare Post Acute Care list provided to:: Patient Choice offered to / list presented to : Patient  Discharge Placement             INPT rehab          Discharge Plan and Services     Post Acute Care Choice: IP Rehab                               Social Determinants of Health (SDOH) Interventions     Readmission Risk Interventions Readmission Risk Prevention Plan 10/07/2019  Transportation Screening (No Data)  Home Care Screening Complete  Medication Review (RN CM) Complete

## 2019-10-07 NOTE — H&P (Signed)
Physical Medicine and Rehabilitation Admission H&P        Chief Complaint  Patient presents with  . Cerebrovascular Accident with functional deficits.       HPI: Elizabeth Tapia is an 86 year old female with history of HTN, CKD, anxiety/depression, tremors; was admitted on 09/11/2019 with dysarthria and right hemiparesis.  Per reports patient had malaise with decrease in appetite for a few days prior to admission.  CTA head/neck showed critical stenosis L-ICA>90% with string sign, hypoperfusion left parietal lobe with possible distal arterial occlusion L-MCA and prominent pannus with subluxation of C1 on C2 compatible with arthropathy and instability--question CPPD versus RA.  MRI brain done showing multiple foci of acute infarcts within left frontal and parietal lobe in watershed distribution.  She was started on heparin and Dr. Durwin Nora was consulted for input.  He recommended left carotid endarterectomy however patient was initially reluctant to consider surgery.  2D echo done showing EF 70 to 75%, no wall abnormality and moderately elevated pulmonary arterial pressures.  Carotid Dopplers done revealing L-ICA 80-99% stenosis.    On 10/01, she is agreeable to undergo left carotid endarterectomy by Dr. Durwin Nora.  Postop on ASA 325 mg/Plavix 75 mg as well as Cleviprex for BP control.  She also had difficulty swallowing post op with a chocking episode and bedside swallow done with recommendations for dysphagia 3, with staff to assist with self feeding but reports of increased coughing with all textures today. ST plans for MBS for objective evaluation. Her anxiety/tachycardia managed with resumption of propanolol and scheduling valium bid.  Patient continues to have RUE weakness with balance deficits, difficulty with sequencing, dysphagia, aphonia and tachycardia with activity. CIR recommended due to functional decline.      Review of Systems  Constitutional: Negative for chills and fever.  HENT:  Negative for hearing loss and tinnitus.   Eyes: Negative for blurred vision and double vision.  Respiratory: Positive for cough. Negative for shortness of breath.   Cardiovascular: Negative for chest pain, palpitations and leg swelling.  Gastrointestinal: Negative for heartburn and nausea.  Genitourinary: Negative for dysuria.  Musculoskeletal: Negative for back pain, joint pain and myalgias.  Skin: Negative for rash.  Neurological: Positive for speech change and focal weakness. Negative for dizziness and headaches.  Psychiatric/Behavioral: The patient is not nervous/anxious and does not have insomnia.           Past Medical History:  Diagnosis Date  . Anxiety    . CKD (chronic kidney disease)    . Depression    . Essential tremor    . HTN (hypertension)    . IBS (irritable bowel syndrome)      with diarrhea           Past Surgical History:  Procedure Laterality Date  . EYE SURGERY               Family History  Problem Relation Age of Onset  . Alcohol abuse Father    . Alcohol abuse Sister    . Alcohol abuse Brother        Social History:  Widowed, was living alone till last year and has been independent without AD. Her grandson (disabled veteran) has been staying with her. Family provides meals/does home management. Plans for patient to move in with son in [place]. She does not use alcohol, tobacco/tobacco products or illicit drugs.      Allergies: No Known Allergies  Medications Prior to Admission  Medication Sig Dispense Refill  . acetaminophen (TYLENOL) 500 MG tablet Take 500 mg by mouth every 6 (six) hours as needed for mild pain.      . cholestyramine (QUESTRAN) 4 g packet Take 1 packet by mouth 3 (three) times daily.      . diazepam (VALIUM) 2 MG tablet Take 2 mg by mouth every 12 (twelve) hours as needed for anxiety.      Marland Kitchen FLUoxetine (PROZAC) 10 MG capsule Take 10 mg by mouth daily.      . propranolol (INDERAL) 60 MG tablet Take 60 mg by mouth 2  (two) times daily.          Drug Regimen Review  Drug regimen was reviewed and remains appropriate with no significant issues identified   Home: Home Living Family/patient expects to be discharged to:: Private residence Living Arrangements: Alone Available Help at Discharge: Family, Friend(s), Available 24 hours/day Type of Home: House Home Access: Stairs to enter Secretary/administrator of Steps: 1 Entrance Stairs-Rails: Right Home Layout: One level Bathroom Shower/Tub: Walk-in shower Home Equipment: Medical laboratory scientific officer - single point, Environmental consultant - 2 wheels, Shower seat - built in Additional Comments: pt reports equipment was her mothers (?reliability)  Lives With: Family   Functional History: Prior Function Level of Independence: Needs assistance Gait / Transfers Assistance Needed: in past year fell due to slip on kitchen floor; otherwise independent ADL's / Homemaking Assistance Needed: Assist from family for appts, driving, grocery shopping and sometimes medications    Functional Status:  Mobility: Bed Mobility Overal bed mobility: Needs Assistance Bed Mobility: Supine to Sit Rolling: Min assist (to L side) Sidelying to sit: Mod assist Supine to sit: Mod assist, HOB elevated Sit to supine: Min assist General bed mobility comments: assist with BLE and to elevate trunk, use of bed pad to scoot to EOB Transfers Overall transfer level: Needs assistance Equipment used: 2 person hand held assist Transfers: Sit to/from Stand, [employer] Transfers Sit to Stand: Mod assist, +2 physical assistance Stand pivot transfers: +2 physical assistance, Mod assist General transfer comment: standing trial x 2. Pt demostrates very flexed posture, unable to correct with cues/assist hindering ability to progress gait. SPT performed bed to recliner toward left. Ambulation/Gait General Gait Details: shuffle, pivotal steps toward left for transfer to recliner   ADL: ADL Overall ADL's : Needs  assistance/impaired Grooming: Wash/dry face, Minimal assistance Upper Body Dressing : Moderate assistance, Cueing for sequencing, Cueing for compensatory techniques Lower Body Dressing: Maximal assistance Toilet Transfer: Maximal assistance   Cognition: Cognition Overall Cognitive Status: Impaired/Different from baseline Arousal/Alertness: Awake/alert Orientation Level: Oriented X4 Attention: Sustained Sustained Attention: Appears intact Memory: Impaired Memory Impairment: Retrieval deficit, Decreased recall of new information, Decreased short term memory Decreased Short Term Memory: Verbal basic, Functional basic Immediate Memory Recall: Sock, Blue, Bed Memory Recall Sock: With Cue Memory Recall Blue: Without Cue Memory Recall Bed: Without Cue Awareness: Impaired Awareness Impairment: Intellectual impairment Problem Solving: Impaired Problem Solving Impairment: Verbal basic, Functional basic Executive Function: Decision Making Decision Making: Impaired Decision Making Impairment: Verbal basic, Functional basic Behaviors: Impulsive Safety/Judgment: Impaired Cognition Arousal/Alertness: Awake/alert Behavior During Therapy: WFL for tasks assessed/performed Overall Cognitive Status: Impaired/Different from baseline Area of Impairment: Safety/judgement, Awareness, Problem solving Safety/Judgement: Decreased awareness of safety, Decreased awareness of deficits Awareness: Intellectual Problem Solving: Difficulty sequencing, Requires verbal cues General Comments: better safety awareness (agrees to stay for rehab)     Blood pressure (!) 144/67, pulse 81, temperature 98.2 F (36.8  C), temperature source Oral, resp. rate 20, height  (1.575 m), weight 41.7 kg, SpO2 97 %. Physical Exam Vitals and nursing note reviewed.  Constitutional:      Appearance: She is underweight.     Comments: Elderly female. Up eating lunch with intermittent bouts of coughing--does better with small  (1/2 tsp) bites.    HENT:     Head: Normocephalic and atraumatic.     Left Ear: External ear normal.     Nose: Nose normal.     Mouth/Throat:     Mouth: Mucous membranes are moist.  Eyes:     Pupils: Pupils are equal, round, and reactive to light.  Neck:     Comments: Left neck incision C/D/I with skin glue--no induration or erythema.  Cardiovascular:     Rate and Rhythm: Normal rate.     Pulses: Normal pulses.  Pulmonary:     Effort: Pulmonary effort is normal. No respiratory distress.     Breath sounds: Normal breath sounds. No wheezing.  Abdominal:     General: Bowel sounds are normal.     Palpations: Abdomen is soft.  Musculoskeletal:        General: No swelling or tenderness.     Cervical back: Normal range of motion.     Right lower leg: No edema.     Left lower leg: No edema.     Comments: Right greater than left Dupuytren's contracture.   Skin:    General: Skin is warm and dry.     Comments: Left CEA incision CDI  Neurological:     Mental Status: She is alert and oriented to person, place, and time.     Comments: Mildly dysarthric, hypophonic speech. Right central 7. RUE 1+ Shoulder ABD/ADD, 1+ EF/EE, 0 to trace wrist, HI. RLE 2+ to 3-/5 prox to distal. DTR's 2++ RUE and RLE. MAS 1/4 flexor pattern RUE and extensor pattern RLE. Senses pain and light touch in all 4's. No tremors noted today. Able to answer orientation questions and follow basic commands without difficulty.        Lab Results Last 48 Hours        Results for orders placed or performed during the hospital encounter of 10/01/19 (from the past 48 hour(s))  Basic metabolic panel     Status: Abnormal    Collection Time: 10/06/19  2:55 AM  Result Value Ref Range    Sodium 135 135 - 145 mmol/L    Potassium 3.9 3.5 - 5.1 mmol/L    Chloride 103 98 - 111 mmol/L    CO2 23 22 - 32 mmol/L    Glucose, Bld 107 (H) 70 - 99 mg/dL      Comment: Glucose reference range applies only to samples taken after fasting for  at least 8 hours.    BUN 29 (H) 8 - 23 mg/dL    Creatinine, Ser 8.29 (H) 0.44 - 1.00 mg/dL    Calcium 8.6 (L) 8.9 - 10.3 mg/dL    GFR calc non Af Amer 34 (L) >60 mL/min    GFR calc Af Amer 39 (L) >60 mL/min    Anion gap 9 5 - 15      Comment: Performed at [facility], [address] [place], [place] [address]  CBC with Differential/Platelet     Status: Abnormal    Collection Time: 10/06/19  2:55 AM  Result Value Ref Range    WBC 9.2 4.0 - 10.5 K/uL  RBC 3.22 (L) 3.87 - 5.11 MIL/uL    Hemoglobin 9.5 (L) 12.0 - 15.0 g/dL    HCT 34.2 (L) 36 - 46 %    MCV 93.2 80.0 - 100.0 fL    MCH 29.5 26.0 - 34.0 pg    MCHC 31.7 30.0 - 36.0 g/dL    RDW 87.6 81.1 - 57.2 %    Platelets 381 150 - 400 K/uL    nRBC 0.0 0.0 - 0.2 %    Neutrophils Relative % 59 %    Neutro Abs 5.4 1.7 - 7.7 K/uL    Lymphocytes Relative 30 %    Lymphs Abs 2.7 0.7 - 4.0 K/uL    Monocytes Relative 9 %    Monocytes Absolute 0.8 0 - 1 K/uL    Eosinophils Relative 2 %    Eosinophils Absolute 0.1 0 - 0 K/uL    Basophils Relative 0 %    Basophils Absolute 0.0 0 - 0 K/uL    Immature Granulocytes 0 %    Abs Immature Granulocytes 0.02 0.00 - 0.07 K/uL      Comment: Performed at [facility], [address] [place], [place] [address]      Imaging Results (Last 48 hours)  No results found.           Medical Problem List and Plan: 1.  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 Pt s/p left CEA.             -patient may  shower             -ELOS/Goals: 12-15 days, supervision goals with PT, OT, SLP 2.  Antithrombotics: -DVT/anticoagulation:  Pharmaceutical: Heparin cleared to resume per surgery             -antiplatelet therapy: on ASA 325/Plavix 75 mg daily.  3. Pain Management: Tylenol prn.  4. Mood: LCSW to follow for evaluation and support.              -antipsychotic agents: N/A 5. Neuropsych: This patient is capable of  making decisions on her own behalf. 6. Skin/Wound Care: Monitor incision for healing. Routine pressure relief measures.  7. Fluids/Electrolytes/Nutrition: Monitor I/O. Check lytes in am.  8. HTN: Monitor BP tid--on inderal 60 mg bid 9. CKD: Encourage fluid intake. Will recheck CBC in am.  10. ABLA: Likely post op with drop 11.2-->9.5. Will continue to monitor H/H with serial checks.  11. Anxiety/depression:  Continue Prozac. On valium bid to help manage anxiety state.  12. IBS--diarrhea: Questran on hold       Jacquelynn Cree, PA-C 10/07/2019  I have personally performed a face to face diagnostic evaluation of this patient and formulated the key components of the plan.  Additionally, I have personally reviewed laboratory data, imaging studies, as well as relevant notes and concur with the physician assistant's documentation above.  The patient's status has not changed from the original H&P.  Any changes in documentation from the acute care chart have been noted above.  Ranelle Oyster, MD, [place]

## 2019-10-07 NOTE — Progress Notes (Signed)
Inpatient Rehab Admissions Coordinator:   I have a bed available for pt to admit to CIR today. Dr. Claudette Laws in agreement.  I will let pt/family and TOC team know.   Estill Dooms, PT, DPT Admissions Coordinator [phone] 10/07/19  10:56 AM

## 2019-10-07 NOTE — Progress Notes (Signed)
Inpatient Rehabilitation Medication Review by a Pharmacist  A complete drug regimen review was completed for this patient to identify any potential clinically significant medication issues.  Clinically significant medication issues were identified:  no  Check AMION for pharmacist assigned to patient if future medication questions/issues arise during this admission.  Pharmacist comments: Diazepam changed from as needed to scheduled upon admission to CIR as intended by physician per H&P.   Time spent performing this drug regimen review (minutes):  10 minutes   Link Snuffer, PharmD, BCPS, BCCCP Clinical Pharmacist Please refer to [facility] for [facility] Pharmacy numbers 10/07/2019 9:42 PM

## 2019-10-07 NOTE — Progress Notes (Signed)
Subjective:  Elizabeth Tapia reports that she is doing "pretty well" this morning and that her speech is improved since yesterday, though her weakness remains about the same. She is amenable to moving to CIR when they have a bed available. She has had no further choking events since Saturday and reports a strong appetite.   Objective:  Vital signs in last 24 hours: Vitals:   10/07/19 0000 10/07/19 0348 10/07/19 0400 10/07/19 0800  BP: 139/66  (!) 145/68 (!) 144/67  Pulse: 83  78 81  Resp: 19  19 20   Temp:  98.3 F (36.8 C)  98.2 F (36.8 C)  TempSrc:  Oral  Oral  SpO2: 97%  97% 97%  Weight:      Height:       Weight change:   Intake/Output Summary (Last 24 hours) at 10/07/2019 1031 Last data filed at 10/07/2019 0801 Gross per 24 hour  Intake 268 ml  Output 400 ml  Net -132 ml   Physical Exam Neck:     Comments: Surgical scar with overlying dermabond, without swelling, erythema or dehiscence Cardiovascular:     Rate and Rhythm: Normal rate and regular rhythm.     Heart sounds: Normal heart sounds. No murmur heard.  Pulmonary:     Effort: Pulmonary effort is normal.     Breath sounds: Normal breath sounds.  Neurological:     Mental Status: She is alert. Mental status is at baseline.     Comments: Speech remains hypophonic, though improved from previous. R-sided facial droop unchanged from previous.  Able to raise R arm ~8 cm against gravity. Grip strength 3/5 in RUE. Strength 5/5 in RLE, LUE, LLE.  Essential tremor of L arm improved. No tremor noted in perioral muscles today.  Psychiatric:     Comments: Less anxious than previous.     Assessment/Plan:  Active Problems:   Stroke (HCC)   Acute CVA (cerebrovascular accident) (HCC)   Benign essential HTN   Anxiety state   Depression   Chronic kidney disease   Prediabetes   Pressure injury of skin  Acute Stroke: Patient with history of HTN, HLD presented with difficulty speaking and R sided weakness. CT and  MRI brain consistent with acute ischemic stroke of L parietal lobe and possible petechial bleed. CTA Neck with critical stenosis of L internal carotid artery >90%. Carotid duplexUS w/ velocities c/w 80-99% stenosis of L ICA. TTE with EF 70-75%, grade 1 diastolic dysfunction, no evidence of intracardiac source of embolism. Patient has been in sinus rhythm throughout hospitalization. The most likely source of Elizabeth Tapia's stroke is a L carotid atheroembolism.S/p L carotid endarterectomy. Reports doing well. Strength of RLE much improved compared to yesterday. Hypophonia, dysarthria, and RUE strength remain unchanged. She will require inpatient rehab for speech, strength, and RUE function. -CIR with plans to admit her when a bed becomes available -ASA 81mg  daily - Plavix 75mg  daily - Atorvastatin 80mg  daily  MDD:  Anxiety: Controlled on home Prozac and PRN Valium - Continue homeProzac - Valium2mg BID  HTN: Controlled on propranolol60mg  BIDat home. -Propranolol 60mg  BID  Essential Tremor: Patient with long-standing essential tremor most noticeable in L arm and perioral muscles. Improved compared to admission. Possible that her home propranolol was providing some relief and that she is improving now that we have restarted this.  - Propranolol as above.  HLD: Information gathered from chart review as patient is not a great historian. It appears she does carry a diagnosis of HLD, but  her only home med is cholestyramine. This is an unusual monotherapy. She also has a history of cholecystectomy, so possible that her cholestyramine was prescribed for diarrhea d/t poor bile acid absorption. Total cholesterol 214; HDL 44; LDL 155;Trigs 75. - Holding home cholestyramine -Statin as above  Health Maintenance: Received first dose of Moderna COVID vaccine 9/30.   LOS: 6 days   Dorothyann Gibbs, Medical Student 10/07/2019, 10:31 AM

## 2019-10-08 ENCOUNTER — Inpatient Hospital Stay (HOSPITAL_COMMUNITY): Payer: Medicare Other | Admitting: Occupational Therapy

## 2019-10-08 ENCOUNTER — Inpatient Hospital Stay (HOSPITAL_COMMUNITY): Payer: Medicare Other

## 2019-10-08 DIAGNOSIS — G8191 Hemiplegia, unspecified affecting right dominant side: Secondary | ICD-10-CM

## 2019-10-08 DIAGNOSIS — D62 Acute posthemorrhagic anemia: Secondary | ICD-10-CM

## 2019-10-08 DIAGNOSIS — E8809 Other disorders of plasma-protein metabolism, not elsewhere classified: Secondary | ICD-10-CM

## 2019-10-08 DIAGNOSIS — I10 Essential (primary) hypertension: Secondary | ICD-10-CM

## 2019-10-08 DIAGNOSIS — N183 Chronic kidney disease, stage 3 unspecified: Secondary | ICD-10-CM

## 2019-10-08 DIAGNOSIS — E46 Unspecified protein-calorie malnutrition: Secondary | ICD-10-CM

## 2019-10-08 DIAGNOSIS — E871 Hypo-osmolality and hyponatremia: Secondary | ICD-10-CM

## 2019-10-08 LAB — CBC WITH DIFFERENTIAL/PLATELET
Abs Immature Granulocytes: 0.04 10*3/uL (ref 0.00–0.07)
Basophils Absolute: 0.1 10*3/uL (ref 0.0–0.1)
Basophils Relative: 1 %
Eosinophils Absolute: 0.3 10*3/uL (ref 0.0–0.5)
Eosinophils Relative: 3 %
HCT: 30.4 % — ABNORMAL LOW (ref 36.0–46.0)
Hemoglobin: 9.6 g/dL — ABNORMAL LOW (ref 12.0–15.0)
Immature Granulocytes: 0 %
Lymphocytes Relative: 26 %
Lymphs Abs: 2.4 10*3/uL (ref 0.7–4.0)
MCH: 29.3 pg (ref 26.0–34.0)
MCHC: 31.6 g/dL (ref 30.0–36.0)
MCV: 92.7 fL (ref 80.0–100.0)
Monocytes Absolute: 1.1 10*3/uL — ABNORMAL HIGH (ref 0.1–1.0)
Monocytes Relative: 12 %
Neutro Abs: 5.4 10*3/uL (ref 1.7–7.7)
Neutrophils Relative %: 58 %
Platelets: 453 10*3/uL — ABNORMAL HIGH (ref 150–400)
RBC: 3.28 MIL/uL — ABNORMAL LOW (ref 3.87–5.11)
RDW: 13.2 % (ref 11.5–15.5)
WBC: 9.4 10*3/uL (ref 4.0–10.5)
nRBC: 0 % (ref 0.0–0.2)

## 2019-10-08 LAB — COMPREHENSIVE METABOLIC PANEL
ALT: 9 U/L (ref 0–44)
AST: 24 U/L (ref 15–41)
Albumin: 2.7 g/dL — ABNORMAL LOW (ref 3.5–5.0)
Alkaline Phosphatase: 69 U/L (ref 38–126)
Anion gap: 9 (ref 5–15)
BUN: 17 mg/dL (ref 8–23)
CO2: 24 mmol/L (ref 22–32)
Calcium: 8.6 mg/dL — ABNORMAL LOW (ref 8.9–10.3)
Chloride: 101 mmol/L (ref 98–111)
Creatinine, Ser: 1.12 mg/dL — ABNORMAL HIGH (ref 0.44–1.00)
GFR calc Af Amer: 51 mL/min — ABNORMAL LOW (ref >60)
GFR calc non Af Amer: 44 mL/min — ABNORMAL LOW (ref >60)
Glucose, Bld: 107 mg/dL — ABNORMAL HIGH (ref 70–99)
Potassium: 3.8 mmol/L (ref 3.5–5.1)
Sodium: 134 mmol/L — ABNORMAL LOW (ref 135–145)
Total Bilirubin: 0.7 mg/dL (ref 0.3–1.2)
Total Protein: 6.2 g/dL — ABNORMAL LOW (ref 6.5–8.1)

## 2019-10-08 MED ORDER — BLOOD PRESSURE CONTROL BOOK
Freq: Once | Status: AC
  Filled 2019-10-08: qty 1

## 2019-10-08 MED ORDER — PROSOURCE PLUS PO LIQD
30.0000 mL | Freq: Two times a day (BID) | ORAL | Status: DC
  Administered 2019-10-08 – 2019-10-26 (×26): 30 mL via ORAL
  Filled 2019-10-08 (×26): qty 30

## 2019-10-08 NOTE — IPOC Note (Signed)
Individualized overall Plan of Care [facility]) Patient Details Name: Elizabeth Tapia MRN: [id number] DOB: [birth year]  Admitting Diagnosis: Cerebral infarction involving left carotid artery [facility])  Hospital Problems: Principal Problem:   Cerebral infarction involving left carotid artery (HCC) Active Problems:   Hypoalbuminemia due to protein-calorie malnutrition (HCC)   Hyponatremia   Acute blood loss anemia   Essential hypertension   Right hemiparesis (HCC)     Functional Problem List: Nursing Bladder, Bowel, Endurance, Medication Management, Motor, Nutrition, Safety, Perception, Sensory, Skin Integrity  PT Balance, Endurance, Motor, Safety  OT Balance, Cognition, Endurance, Motor, Pain, Safety  SLP    TR         Basic ADL's: OT Grooming, Bathing, Dressing, Toileting     Advanced  ADL's: OT       Transfers: PT Bed Mobility, Bed to Chair, Customer service manager, Tub/Shower     Locomotion: PT Ambulation, Psychologist, prison and probation services, Stairs     Additional Impairments: OT Fuctional Use of Upper Extremity  SLP Swallowing, Communication, Social Cognition expression Attention, Memory, Problem Solving, Awareness  TR      Anticipated Outcomes Item Anticipated Outcome  Self Feeding Setup  Swallowing  Supervision A   Basic self-care  Min assist  Toileting  Min assist   Bathroom Transfers Min assist  Bowel/Bladder  To be continent of bowel/bladder with Mod I  Transfers  supervision with LRAD  Locomotion  CGA with LRAD  Communication  Supervision A  Cognition  Supervision A  Pain  <2  Safety/Judgment  Able to call for help with mod I   Therapy Plan: PT Intensity: Minimum of 1-2 x/day ,45 to 90 minutes PT Frequency: 5 out of 7 days PT Duration Estimated Length of Stay: 2-3 weeks OT Intensity: Minimum of 1-2 x/day, 45 to 90 minutes OT Frequency: 5 out of 7 days OT Duration/Estimated Length of Stay: 2-3 weeks SLP Intensity: Minumum of 1-2 x/day, 30 to 90 minutes SLP  Frequency: 3 to 5 out of 7 days SLP Duration/Estimated Length of Stay: 2-3 weeks    Team Interventions: Nursing Interventions Patient/Family Education, Bladder Management, Bowel Management, Disease Management/Prevention, Cognitive Remediation/Compensation, Skin Care/Wound Management, Medication Management, Pain Management, Discharge Planning, Dysphagia/Aspiration Precaution Training  PT interventions Ambulation/gait training, Discharge planning, Functional mobility training, Psychosocial support, Therapeutic Activities, Balance/vestibular training, Disease management/prevention, Neuromuscular re-education, Skin care/wound management, Therapeutic Exercise, Wheelchair propulsion/positioning, Cognitive remediation/compensation, DME/adaptive equipment instruction, Pain management, Splinting/orthotics, UE/LE Strength taining/ROM, Community reintegration, Development worker, international aid stimulation, Equities trader education, Museum/gallery curator, UE/LE Coordination activities  OT Interventions Warden/ranger, Cognitive remediation/compensation, Firefighter, Discharge planning, Disease mangement/prevention, Fish farm manager, Functional electrical stimulation, Functional mobility training, Neuromuscular re-education, Pain management, Patient/family education, Psychosocial support, Self Care/advanced ADL retraining, Splinting/orthotics, Therapeutic Activities, Therapeutic Exercise, UE/LE Strength taining/ROM, UE/LE Coordination activities  SLP Interventions Cognitive remediation/compensation, Cueing hierarchy, Dysphagia/aspiration precaution training, Functional tasks, Medication managment, Patient/family education, Internal/external aids  TR Interventions    SW/CM Interventions Discharge Planning, Psychosocial Support, Patient/Family Education   Barriers to Discharge MD  Medical stability  Nursing      PT Inaccessible home environment, Home environment access/layout, Other  (comments) R hemi, 1-2 STE with 1 handrail  OT Incontinence    SLP      SW       Team Discharge Planning: Destination: PT-Home ,OT- Home , SLP-Home Projected Follow-up: PT-Home health PT, OT-  Home health OT, 24 hour supervision/assistance, SLP-Home Health SLP, 24 hour supervision/assistance Projected Equipment Needs: PT-To be determined, OT- To be determined, SLP-None recommended by SLP Equipment  Details: PT- , OT-  Patient/family involved in discharge planning: PT- Patient,  OT-Patient, SLP-Patient, Family member/caregiver  MD ELOS: 12-15 days. Medical Rehab Prognosis:  Good Assessment: 86 year old female with history of HTN, CKD, anxiety/depression,tremors; was admitted on 09/11/2019 with dysarthria and right hemiparesis. Per reports patient had malaise with decrease in appetite for a few days prior to admission. CTA head/neck showed critical stenosis L-ICA>90%with string sign, hypoperfusion left parietal lobe with possible distal arterial occlusion L-MCA and prominent pannus with subluxation of C1 on C2 compatible with arthropathy and instability--question CPPD versus RA. MRI brain done showing multiple foci of acute infarcts within left frontal and parietal lobe in watershed distribution. She was started on heparin and Dr. Durwin Nora was consulted for input. He recommended left carotid endarterectomy however patient was initially reluctant to consider surgery. 2D echo done showing EF 70 to 75%, no wall abnormality and moderately elevated pulmonary arterial pressures. Carotid Dopplers done revealing L-ICA80-99%stenosis.On 10/01, she was agreeable to undergo left carotid endarterectomy by Dr. Durwin Nora. Postop on ASA 325 mg/Plavix 75 mg as well as Cleviprex for BP control. She also had difficulty swallowing post op with a chocking episode and bedside swallow done with recommendations for dysphagia 3, with staff to assist with self feeding but reports of increased coughing with all textures  today. ST plans for MBS for objective evaluation. Her anxiety/tachycardia managed with resumption of propanolol and scheduling valium bid. Patient continues to have RUE weakness with balance deficits, difficulty with sequencing, dysphagia, aphonia and tachycardia with activity. Will set goals for Min A with PT/OT and Supervision with SLP.  Due to the current state of emergency, patients may not be receiving their 3-hours of Medicare-mandated therapy.  See Team Conference Notes for weekly updates to the plan of care

## 2019-10-08 NOTE — Evaluation (Addendum)
Speech Language Pathology Assessment and Plan  Patient Details  Name: Elizabeth Tapia MRN: [id number] Date of Birth: 1929-03-19  SLP Diagnosis: Cognitive Impairments;Dysphagia;Voice disorder  Rehab Potential: Good ELOS: 2-3 weeks    Today's Date: 10/08/2019 SLP Individual Time: 1404-1500 SLP Individual Time Calculation (min): 56 min   Hospital Problem: Principal Problem:   Cerebral infarction involving left carotid artery (HCC) Active Problems:   Hypoalbuminemia due to protein-calorie malnutrition (HCC)   Hyponatremia   Acute blood loss anemia   Essential hypertension   Right hemiparesis (HCC)  Past Medical History:  Past Medical History:  Diagnosis Date  . Anxiety   . CKD (chronic kidney disease)   . Depression   . Essential tremor   . HTN (hypertension)   . IBS (irritable bowel syndrome)    with diarrhea   Past Surgical History:  Past Surgical History:  Procedure Laterality Date  . ENDARTERECTOMY Left 10/04/2019   Procedure: ENDARTERECTOMY CAROTID;  Surgeon: Angelia Mould, MD;  Location: ;  Service: Vascular;  Laterality: Left;  . EYE SURGERY      Assessment / Plan / Recommendation Clinical Impression Patient is a 86 y.o. year old female with history of HTN, CKD, anxiety/depression,tremors who was admitted on9/08/2019 with dysarthria and right hemiparesis. History taken from chart review, son, and patient due to cognitive slowing.Patient also reported having had malaise with decrease in appetite times few days. CTA head/neck showed critical stenosis left ICA greater than 90% with string sign, hypoperfusion left parietal lobe with possible distal arterial occlusion left MCAand prominent pannus with subluxation of C1 on C2 compatible with arthropathy andinstability--?CPPD versus RA. MRI brain showed multiple fociof acute infarcts within left frontal and parietal lobe in watershed distribution. She was started on heparin ggtand Dr. Doren Custard consulted for  input. Herecommended left carotid endarterectomy--carotid Dopplerspending. Echocardiogram with ejection fraction of 70-75%, no wall abnormality and moderately elevated pulmonary arterial pressures.Pt underwent carotid endarterectomy on 10/1.PT evaluations revealed balance deficits with difficulty standing and CIR recommended to functional decline.  Pt presents with moderate cognitive impairments, deficits include reduced sustained attention in mildly complex tasks, mildly complex problem solving, emergent awareness, and short-term recall.  Formal cognitive linguistic assessment utilizing Cognistat, indicated severe impairment in short term recall, moderate impairment in sustained attention (suggest further impacted by memory deficits), moderate impairment in construction task and mild impairment in calculations with all other subsections WFL. Pt demonstrated awareness of deficits and of most functional errors, but limited ability to correct within task. Pt presents with intermittent wet vocal quality with limited awareness. Pt's vocal quality is also hoarse and low volume in nature, however is 80% intelligible at the sentence level. Oral motor exam indicated right facial and lingual weakness. Pt demonstrated no recall of swallow strategies/precautions. Pt consumed thin liquids via cup with no overt s/s aspiration, and regular textures presented with increase mastication and reduced bolus cohesion compared to dys 3 textures. SLP recommend continuing diet of dys 3 textures and thin liquids with full supervision A for use of swallow strategies. Pt would benefit from skilled ST services in order to maximize functional independence and reduce burden of care, likely intermittent supervision at discharge with continued skilled ST services.   Skilled Therapeutic Interventions          Skilled ST services focused on cognitive skills. SLP facilitated administration of cognitive linguistic formal assessment and  provided education of results. SLP and pt collaborated to set goals for cognitive linguistic needs during length of stay. All questions answered  to satisfaction.  Pt was left in room with call bell within reach and chair alarm set. SLP recommends to continue skilled services.    SLP Assessment  Patient will need skilled Speech Lanaguage Pathology Services during CIR admission    Recommendations  SLP Diet Recommendations: Dysphagia 3 (Mech soft);Thin Liquid Administration via: Cup Medication Administration: Crushed with puree Supervision: Staff to assist with self feeding;Full supervision/cueing for compensatory strategies Compensations: Slow rate;Small sips/bites;Effortful swallow Postural Changes and/or Swallow Maneuvers: Seated upright 90 degrees;Upright 30-60 min after meal Oral Care Recommendations: Oral care BID Patient destination: Home Follow up Recommendations: Home Health SLP;24 hour supervision/assistance Equipment Recommended: None recommended by SLP    SLP Frequency 3 to 5 out of 7 days   SLP Duration  SLP Intensity  SLP Treatment/Interventions 2-3 weeks  Minumum of 1-2 x/day, 30 to 90 minutes  Cognitive remediation/compensation;Cueing hierarchy;Dysphagia/aspiration precaution training;Functional tasks;Medication managment;Patient/family education;Internal/external aids    Pain Pain Assessment Pain Score: 0-No pain  Prior Functioning Cognitive/Linguistic Baseline: Information not available Type of Home: House  Lives With: Family Available Help at Discharge: Family;Available 24 hours/day Vocation: Retired  Programmer, systems Overall Cognitive Status: Impaired/Different from baseline Arousal/Alertness: Awake/alert Orientation Level: Oriented X4 (except for date) Attention: Sustained Sustained Attention: Impaired Sustained Attention Impairment: Functional complex;Verbal complex Memory: Impaired Memory Impairment: Retrieval deficit;Decreased short term  memory Awareness: Impaired Awareness Impairment: Emergent impairment Problem Solving: Impaired Problem Solving Impairment: Verbal complex;Verbal basic (mildy complex) Safety/Judgment: Impaired  Comprehension Auditory Comprehension Overall Auditory Comprehension: Appears within functional limits for tasks assessed Yes/No Questions: Within Functional Limits Commands: Within Functional Limits Conversation: Simple Visual Recognition/Discrimination Discrimination: Not tested Reading Comprehension Reading Status: Not tested Expression Expression Primary Mode of Expression: Verbal Verbal Expression Overall Verbal Expression: Appears within functional limits for tasks assessed Initiation: No impairment Automatic Speech: Name Level of Generative/Spontaneous Verbalization: Sentence Naming: No impairment Interfering Components: Speech intelligibility Oral Motor Oral Motor/Sensory Function Overall Oral Motor/Sensory Function: Mild impairment Facial ROM: Reduced right Facial Symmetry: Abnormal symmetry right Facial Strength: Reduced right Facial Sensation: Within Functional Limits Lingual ROM: Within Functional Limits Lingual Symmetry: Abnormal symmetry right;Other (Comment) Lingual Strength: Reduced Lingual Sensation: Within Functional Limits Velum: Within Functional Limits Mandible: Impaired Motor Speech Overall Motor Speech: Impaired Respiration: Within functional limits Phonation: Hoarse;Wet;Low vocal intensity Resonance: Within functional limits Articulation: Within functional limitis Intelligibility: Intelligibility reduced Word: 75-100% accurate Phrase: 75-100% accurate Sentence: 75-100% accurate Conversation: 50-74% accurate Motor Planning: Witnin functional limits Motor Speech Errors: Not applicable  Care Tool Care Tool Cognition Expression of Ideas and Wants Expression of Ideas and Wants: Some difficulty - exhibits some difficulty with expressing needs and ideas  (e.g, some words or finishing thoughts) or speech is not clear   Understanding Verbal and Non-Verbal Content Understanding Verbal and Non-Verbal Content: Usually understands - understands most conversations, but misses some part/intent of message. Requires cues at times to understand   Memory/Recall Ability *first 3 days only Memory/Recall Ability *first 3 days only: Current season;Staff names and faces;That he or she is in a hospital/hospital unit     PMSV Assessment  PMSV Trial Intelligibility: Intelligibility reduced Word: 75-100% accurate Phrase: 75-100% accurate Sentence: 75-100% accurate Conversation: 50-74% accurate  Bedside Swallowing Assessment General Date of Onset: 10/01/19 Previous Swallow Assessment: MBS 10/4 Diet Prior to this Study: Dysphagia 3 (soft);Thin liquids Respiratory Status: Room air History of Recent Intubation: No Behavior/Cognition: Alert;Cooperative Oral Cavity - Dentition: Adequate natural dentition Self-Feeding Abilities: Able to feed self Vision: Functional for self-feeding Patient Positioning: Upright in chair/Tumbleform  Baseline Vocal Quality: Low vocal intensity Volitional Cough: Weak Volitional Swallow: Able to elicit  Oral Care Assessment Does patient have any of the following "high(er) risk" factors?: None of the above Patient is HIGH RISK: Non-ventilated: Order set for Adult Oral Care Protocol initiated - "High Risk Patients - Non-Ventilated" option selected  (see row information) Patient is AT RISK: Order set for Adult Oral Care Protocol initiated -  "At Risk Patients" option selected (see row information) Patient is LOW RISK: Follow universal precautions (see row information) Ice Chips Ice chips: Not tested Thin Liquid Thin Liquid: Within functional limits Presentation: Cup Nectar Thick Nectar Thick Liquid: Not tested Honey Thick Honey Thick Liquid: Not tested Puree Puree: Not tested Solid Solid: Impaired Presentation: Self  Fed Oral Phase Impairments: Poor awareness of bolus Oral Phase Functional Implications: Impaired mastication BSE Assessment Risk for Aspiration Impact on safety and function: Mild aspiration risk  Short Term Goals: Week 1: SLP Short Term Goal 1 (Week 1): Pt will demonstrate sustained attention in 30 minute intervals with min A verbal cues during mildly complex tasks. SLP Short Term Goal 2 (Week 1): Pt will demonstrate basic to mildly complex problem solving skills with min A verbal cues. SLP Short Term Goal 3 (Week 1): Pt will demonstrate self-monitoring and self-correcting of functional errors in problem solving tasks with min A verbal cues. SLP Short Term Goal 4 (Week 1): Pt will demonstrate recall of daily information (including speech and swallow strategies) with min A verbal cues for visual aids. SLP Short Term Goal 5 (Week 1): Pt will utilize swallow strategies when consuming dys 3 textures and thin liquids via cup with minimal overt s/s aspiration and min A verbal cues. SLP Short Term Goal 6 (Week 1): Pt will increase vocal intensity to 85% intelligibility at the sentence level and clear wet vocal quality with min A verbal cues.  Refer to Care Plan for Long Term Goals  Recommendations for other services: None   Discharge Criteria: Patient will be discharged from SLP if patient refuses treatment 3 consecutive times without medical reason, if treatment goals not met, if there is a change in medical status, if patient makes no progress towards goals or if patient is discharged from hospital.  The above assessment, treatment plan, treatment alternatives and goals were discussed and mutually agreed upon: by patient  Ieisha Gao  [facility] 10/08/2019, 5:03 PM

## 2019-10-08 NOTE — Evaluation (Signed)
Physical Therapy Assessment and Plan  Patient Details  Name: Elizabeth Tapia MRN: [id number] Date of Birth: 21-May-1929  PT Diagnosis: Abnormal posture, Abnormality of gait, Cognitive deficits, Coordination disorder, Difficulty walking, Hemiparesis dominant, Impaired cognition, and Muscle weakness Rehab Potential: Good ELOS: 2-3 weeks   Today's Date: 10/08/2019 PT Individual Time: 0800-0910 PT Individual Time Calculation (min): 70 min    Hospital Problem: Principal Problem:   Cerebral infarction involving left carotid artery ([place]) Active Problems:   Hypoalbuminemia due to protein-calorie malnutrition ([place])   Hyponatremia   Acute blood loss anemia   Essential hypertension   Right hemiparesis ([place])   Past Medical History:  Past Medical History:  Diagnosis Date  . Anxiety   . CKD (chronic kidney disease)   . Depression   . Essential tremor   . HTN (hypertension)   . IBS (irritable bowel syndrome)    with diarrhea   Past Surgical History:  Past Surgical History:  Procedure Laterality Date  . ENDARTERECTOMY Left 10/04/2019   Procedure: ENDARTERECTOMY CAROTID;  Surgeon: Angelia Mould, MD;  Location: [facility];  Service: Vascular;  Laterality: Left;  . EYE SURGERY      Assessment & Plan Clinical Impression: Patient is a 86 y.o. year old female with history of HTN, CKD, anxiety/depression, tremors who was admitted on 09/11/2019 with dysarthria and right hemiparesis.  History taken from chart review, son, and patient due to cognitive slowing. Patient also reported having had malaise with decrease in appetite times few days.  CTA head/neck showed critical stenosis left ICA greater than 90% with string sign, hypoperfusion left parietal lobe with possible distal arterial occlusion left MCA and prominent pannus with subluxation of C1 on C2 compatible with arthropathy and instability--?  CPPD versus RA.  MRI brain showed multiple foci of acute infarcts within left frontal and parietal  lobe in watershed distribution.  She was started on heparin ggt and Dr. Doren Custard consulted for input. He recommended left carotid endarterectomy--carotid Dopplers pending.  Echocardiogram with ejection fraction of 70-75%, no wall abnormality and moderately elevated pulmonary arterial pressures.  Pt underwent carotid endarterectomy on 10/1.  PT evaluations revealed balance deficits with difficulty standing and CIR recommended to functional decline  Patient currently requires max with mobility secondary to muscle weakness, impaired timing and sequencing, unbalanced muscle activation, decreased coordination, and decreased motor planning, decreased awareness, decreased problem solving, decreased safety awareness, and decreased memory, and decreased sitting balance, decreased standing balance, decreased postural control, hemiplegia, and decreased balance strategies.  Prior to hospitalization, patient was independent  with mobility and lived with Family (grandson lives with her, son checks in frequently) in a House home.  Home access is 1-2Stairs to enter.  Patient will benefit from skilled PT intervention to maximize safe functional mobility, minimize fall risk, and decrease caregiver burden for planned discharge home with 24 hour assist.  Anticipate patient will benefit from follow up [facility] at discharge.  PT - End of Session Activity Tolerance: Tolerates 30+ min activity with multiple rests Endurance Deficit: Yes Endurance Deficit Description: pt required multiple rest breaks PT Assessment Rehab Potential (ACUTE/IP ONLY): Good PT Barriers to Discharge: [place] home environment;Home environment access/layout;Other (comments) PT Barriers to Discharge Comments: R hemi, 1-2 STE with 1 handrail PT Patient demonstrates impairments in the following area(s): Balance;Endurance;Motor;Safety PT Transfers Functional Problem(s): Bed Mobility;Bed to Chair;Car PT Locomotion Functional Problem(s): Ambulation;Wheelchair  Mobility;Stairs PT Plan PT Intensity: Minimum of 1-2 x/day ,45 to 90 minutes PT Frequency: 5 out of 7 days PT Duration Estimated  Length of Stay: 2-3 weeks PT Treatment/Interventions: Ambulation/gait training;Discharge planning;Functional mobility training;Psychosocial support;Therapeutic Activities;Balance/vestibular training;Disease management/prevention;Neuromuscular re-education;Skin care/wound management;Therapeutic Exercise;Wheelchair propulsion/positioning;Cognitive remediation/compensation;DME/adaptive equipment instruction;Pain management;Splinting/orthotics;UE/LE Strength taining/ROM;Community reintegration;Functional electrical stimulation;Patient/family education;Stair training;UE/LE Coordination activities PT Transfers Anticipated Outcome(s): supervision with LRAD PT Locomotion Anticipated Outcome(s): CGA with LRAD PT Recommendation Follow Up Recommendations: Home health PT Patient destination: Home Equipment Recommended: To be determined  PT Evaluation Precautions/Restrictions Precautions Precautions: Fall Precaution Comments: R hemiparesis Home Living/Prior Functioning Home Living Available Help at Discharge: Family;Available 24 hours/day (plans to d/c to son's house where he and his wife can provide 24/7 assist) Type of Home: House Home Access: Stairs to enter [employer] of Steps: 1-2 Entrance Stairs-Rails: Right Home Layout: One level Bathroom Shower/Tub: Walk-in shower;Tub/shower unit Armed forces training and education officer: Yes  Lives With: Family (grandson lives with her, son checks in frequently) Prior Function Level of Independence: Independent with gait;Independent with basic ADLs;Independent with transfers;Independent with homemaking with ambulation  Able to Take Stairs?:  (pt reports she didn't have to go up/down stairs) Driving: Yes 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 Cognition Overall Cognitive Status:  Impaired/Different from baseline Arousal/Alertness: Awake/alert Orientation Level: Oriented X4 Attention: Sustained Sustained Attention: Appears intact Memory: Impaired Memory Impairment: Retrieval deficit;Decreased short term memory Immediate Memory Recall: Sock;Blue;Bed Memory Recall Sock: Without Cue Memory Recall Blue: Without Cue Memory Recall Bed: Not able to recall Awareness: Impaired Awareness Impairment: Intellectual impairment Problem Solving: Impaired Problem Solving Impairment: Verbal basic;Functional basic Safety/Judgment: Impaired Sensation Sensation Light Touch: Appears Intact Proprioception: Appears Intact Coordination Gross Motor Movements are Fluid and Coordinated: No Fine Motor Movements are Fluid and Coordinated: No Motor  Motor Motor: Hemiplegia;Abnormal postural alignment and control Motor - Skilled Clinical Observations: R hemiparesis, generalized weakness, decreased balance/postural control  Trunk/Postural Assessment  Cervical Assessment Cervical Assessment: Exceptions to [facility] (forward head) Thoracic Assessment Thoracic Assessment: Exceptions to [facility] (significant kyphosis) Lumbar Assessment Lumbar Assessment: Exceptions to [facility] (posterior pelvic tilt) Postural Control Postural Control: Deficits on evaluation  Balance Balance Balance Assessed: Yes Static Sitting Balance Static Sitting - Balance Support: Bilateral upper extremity supported;Feet supported Static Sitting - Level of Assistance: 3: Mod assist Dynamic Sitting Balance Dynamic Sitting - Balance Support: Feet supported;Bilateral upper extremity supported Dynamic Sitting - Level of Assistance: 3: Mod assist Static Standing Balance Static Standing - Balance Support: No upper extremity supported Static Standing - Level of Assistance: 2: Max assist Extremity Assessment  RLE Assessment RLE Assessment: Exceptions to [facility] General Strength Comments: grossly generalized to 3+/5 LLE Assessment LLE  Assessment: Exceptions to [facility] General Strength Comments: grossly generalized to 3/5 (hip flexion 3+/5)  Care Tool Care Tool Bed Mobility Roll left and right activity   Roll left and right assist level: Contact Guard/Touching assist    Sit to lying activity        Lying to sitting edge of bed activity   Lying to sitting edge of bed assist level: Contact Guard/Touching assist     Care Tool Transfers Sit to stand transfer   Sit to stand assist level: Maximal Assistance - Patient 25 - 49%    Chair/bed transfer   Chair/bed transfer assist level: Maximal Assistance - Patient 25 - 49%     Toilet transfer   Assist Level: Total Assistance - Patient < 25%    Car transfer Car transfer activity did not occur: Safety/medical concerns (R hemi, fatigue,generalized weakness, decresed balance/postural control)        Care Tool Locomotion Ambulation Ambulation activity did not occur:  Safety/medical concerns (R hemi, fatigue,generalized weakness, decresed balance/postural control)        Walk 10 feet activity Walk 10 feet activity did not occur: Safety/medical concerns (R hemi, fatigue,generalized weakness, decresed balance/postural control)       Walk 50 feet with 2 turns activity Walk 50 feet with 2 turns activity did not occur: Safety/medical concerns (R hemi, fatigue,generalized weakness, decresed balance/postural control)      Walk 150 feet activity Walk 150 feet activity did not occur: Safety/medical concerns (R hemi, fatigue,generalized weakness, decresed balance/postural control)      Walk 10 feet on uneven surfaces activity Walk 10 feet on uneven surfaces activity did not occur: Safety/medical concerns (R hemi, fatigue,generalized weakness, decresed balance/postural control)      Stairs Stair activity did not occur: Safety/medical concerns (R hemi, fatigue,generalized weakness, decresed balance/postural control)        Walk up/down 1 step activity Walk up/down 1 step or curb  (drop down) activity did not occur: Safety/medical concerns (R hemi, fatigue,generalized weakness, decresed balance/postural control)     Walk up/down 4 steps activity did not occuR: Safety/medical concerns (R hemi, fatigue,generalized weakness, decresed balance/postural control)  Walk up/down 4 steps activity      Walk up/down 12 steps activity Walk up/down 12 steps activity did not occur: Safety/medical concerns (R hemi, fatigue,generalized weakness, decresed balance/postural control)      Pick up small objects from floor Pick up small object from the floor (from standing position) activity did not occur: Safety/medical concerns (R hemi, fatigue,generalized weakness, decresed balance/postural control)      Wheelchair Will patient use wheelchair at discharge?: Yes Type of Wheelchair: Manual   Wheelchair assist level: Moderate Assistance - Patient 50 - 74% Max wheelchair distance: 41ft  Wheel 50 feet with 2 turns activity   Assist Level: Total Assistance - Patient < 25%  Wheel 150 feet activity   Assist Level: Dependent - Patient 0%    Refer to Care Plan for Long Term Goals  SHORT TERM GOAL WEEK 1 PT Short Term Goal 1 (Week 1): Pt will perform bed mobility with supervision PT Short Term Goal 2 (Week 1): Pt wil transfer sit<>stand with LRAD min A PT Short Term Goal 3 (Week 1): Pt will perform simulated car transfer with LRAD mod A  Recommendations for other services: None   Skilled Therapeutic Intervention Evaluation completed (see details above and below) with education on PT POC and goals and individual treatment initiated with focus on functional mobility/transfers, generalized strengthening, dynamic standing balance/coordination, and improved activity tolerance. Received pt supine in bed with RN present administering medications, pt educated on PT evaluation, CIR policies, therapy schedule, and agreeable. Pt denied any pain during session. Therapist located [id number] WC and scrub  clothing for pt. MD present for morning rounds. Pt reported urge to have BM and transferred supine<>sitting EOB with HOB elevated and use of bedrails with CGA with increased time and cues for reciprocal scooting to EOB. Pt transferred sit<>stand without AD and max A and stand<>pivot bed<>WC without AD max A. Pt with significantly flexed trunk and bilateral knee flexion and required max cues for upright posture/gaze and stepping sequence when transferring to WC. Pt transferred WC<>toilet with bedside commode over top with max A and required total A to doff brief. Pt required mod A to scoot hips back on commode and with small BM and loose BM smear in brief. Donned clean brief and pants total A while sitting on toilet and donned pull over top with  max A to manage L UE. Pt transferred sit<>stand max A and required total A for peri-care. Pt unable to remain standing long enough to get brief and pants over hips and required 2 additional seated rest breaks on commode. Pt required total A for clothing management and total A for stand<>pivot back to WC due to difficulty stepping feet and pt beginning sit mid-transfer. Pt reported feeling dizzy but reported symptom relief after resting in WC for 4 minutes with cues for upright posture and gaze. Pt performed WC mobility 16ft using R hemi technique with mod A and max cues for propulsion technique. Pt transferred sit<>stand mod A x 2 trials with L handrail in hallway with therapist supporting L UE and attempted to ambulate. However, pt with significant trunk flexion, downward gaze, and bilateral knee flexion and was unable to achieve hip/knee/trunk extension despite cues and spontaneously returned to sitting stating "I can't do this right now". Pt transported back to room in [facility] total A. Concluded session with pt sitting in WC, needs within reach, and seatbelt alarm on. R UE supported on pillow for improved positioning and safety plan updated.   Mobility Bed Mobility Bed  Mobility: Rolling Right Rolling Right: Contact Guard/Touching assist Transfers Transfers: Sit to Stand;Stand to Sit;Stand Pivot Transfers Sit to Stand: Maximal Assistance - Patient 25-49% Stand to Sit: Moderate Assistance - Patient 50-74% Stand Pivot Transfers: Maximal Assistance - Patient 25 - 49% Stand Pivot Transfer Details: Verbal cues for sequencing;Verbal cues for technique;Verbal cues for precautions/safety Stand Pivot Transfer Details (indicate cue type and reason): verbal cues for turning technique, sequencing, and upright posture Locomotion  Wheelchair Mobility Wheelchair Mobility: Yes Wheelchair Assistance: Moderate Assistance - Patient 50 - 74% Wheelchair Propulsion: Left upper extremity;Left lower extremity Wheelchair Parts Management: Needs assistance Distance: 54ft   Discharge Criteria: Patient will be discharged from PT if patient refuses treatment 3 consecutive times without medical reason, if treatment goals not met, if there is a change in medical status, if patient makes no progress towards goals or if patient is discharged from hospital.  The above assessment, treatment plan, treatment alternatives and goals were discussed and mutually agreed upon: by patient  Alfonse Alpers PT, DPT  10/08/2019, 12:22 PM

## 2019-10-08 NOTE — Progress Notes (Signed)
Inpatient Rehabilitation  Patient information reviewed and entered into eRehab system by Alexee Delsanto M. Willadene Mounsey, M.A., CCC/SLP, PPS Coordinator.  Information including medical coding, functional ability and quality indicators will be reviewed and updated through discharge.    

## 2019-10-08 NOTE — Progress Notes (Addendum)
Patient ID: Elizabeth Tapia, female   DOB: [birth year], 85 y.o.   MRN: [id number] Patient given handouts on HTN and HLD and DAPT per nurse Dauda for secondary stroke prevention. Given handouts on HTN, blood pressure control, cholesterol and eating after a stroke. No questions noted at present. Pamelia Hoit

## 2019-10-08 NOTE — Progress Notes (Signed)
[place] PHYSICAL MEDICINE & REHABILITATION PROGRESS NOTE  Subjective/Complaints: Patient seen sitting up in bed this morning, about to work with therapies.  She states she slept well overnight.  She states she is ready begin therapies.  ROS: Denies CP, SOB, N/V/D  Objective: Vital Signs: Blood pressure (!) 111/53, pulse 81, temperature 98.3 F (36.8 C), resp. rate 16, height 5\' 2"  (1.575 m), weight 41.6 kg, SpO2 97 %. DG Swallowing Func-Speech Pathology  Result Date: 10/07/2019 Objective Swallowing Evaluation: Type of Study: MBS-Modified Barium Swallow Study  Patient Details Name: Elizabeth Tapia MRN: Corky Crafts Date of Birth: 1929-06-07 Today's Date: 10/07/2019 Time: SLP Start Time (ACUTE ONLY): 1347 -SLP Stop Time (ACUTE ONLY): 1405 SLP Time Calculation (min) (ACUTE ONLY): 18 min Past Medical History: Past Medical History: Diagnosis Date . Anxiety  . CKD (chronic kidney disease)  . Depression  . Essential tremor  . HTN (hypertension)  . IBS (irritable bowel syndrome)   with diarrhea Past Surgical History: Past Surgical History: Procedure Laterality Date . ENDARTERECTOMY Left 10/04/2019  Procedure: ENDARTERECTOMY CAROTID;  Surgeon: 12/04/2019, MD;  Location: [facility] OR;  Service: Vascular;  Laterality: Left; . EYE SURGERY   HPI:  Elizabeth Tapia is an 86 year old female with a history of hypertension, hyperlipidemia, CKD IIIa, ?rheumatoid arthritis, anxiety, and depression who presents with an acute stroke. Patient was in her usual state of health last night and awoke this morning with slurred speech and R sided weakness. She was unable to walk. She lives with her grandson and called to him for help. She has no history of prior stroke or known vascular disease.   10/01/19 MRI head indicated: This demonstrates multiple foci of acute infarcts within the left frontal and parietal lobes in a watershed distribution.  Underwent left CEA 10/04/19.  Subjective: pt alert, cooperative Assessment / Plan /  Recommendation CHL IP CLINICAL IMPRESSIONS 10/07/2019 Clinical Impression Pt has a mild pharyngeal and cervical esophageal dysphagia, with oral phase relatively functional. She has reduced base of tongue retraction and anterior hyoid movement, with reduced epiglottic inversion and laryngeal vestibule closure. Her UES opening also appears to be somewhat reduced and there is an outpouching in which barium collects during the swallow. Backflow occurs between the opening of the UES and the location of this outpouching. Of note, barium was also noted to remain more distally in the esophagus in a more tortuous looking shape, with areas that also appeared to be dilated at rest.  Pt has moderate amounts of vallecular residue that does not clear her pharynx during the swallow that is more prevalent with solids than with liquids. Trace amounts of thin liquids also enter the airway with larger boluses. Aspiration occurred with a spontaneous cough response, which was also elicited when penetration reached the level of the vocal folds. Her sensation appears to be good considering this response to trace stimulation, as well as her spontaneous subswallows that she performs to reduce (but not clear) the residue from her pharynx. Pt can achieve better efficiency with cues for hard swallows, and she can protect her airway better when taking small, single sips via cup. Recommend continuing current diet with use of these swallowing strategies to maximize safety.   SLP Visit Diagnosis Dysphagia, pharyngoesophageal phase (R13.14) Attention and concentration deficit following -- Frontal lobe and executive function deficit following -- Impact on safety and function Mild aspiration risk   CHL IP TREATMENT RECOMMENDATION 10/07/2019 Treatment Recommendations Therapy as outlined in treatment plan below   Prognosis 10/07/2019  Prognosis for Safe Diet Advancement Good Barriers to Reach Goals Time post onset Barriers/Prognosis Comment -- CHL IP DIET  RECOMMENDATION 10/07/2019 SLP Diet Recommendations Dysphagia 3 (Mech soft) solids;Thin liquid Liquid Administration via Cup;No straw Medication Administration Crushed with puree Compensations Slow rate;Small sips/bites;Effortful swallow Postural Changes Seated upright at 90 degrees;Remain semi-upright after after feeds/meals (Comment)   CHL IP OTHER RECOMMENDATIONS 10/07/2019 Recommended Consults -- Oral Care Recommendations Oral care BID Other Recommendations --   CHL IP FOLLOW UP RECOMMENDATIONS 10/07/2019 Follow up Recommendations Inpatient Rehab   CHL IP FREQUENCY AND DURATION 10/07/2019 Speech Therapy Frequency (ACUTE ONLY) min 2x/week Treatment Duration 2 weeks      CHL IP ORAL PHASE 10/07/2019 Oral Phase WFL Oral - Pudding Teaspoon -- Oral - Pudding Cup -- Oral - Honey Teaspoon -- Oral - Honey Cup -- Oral - Nectar Teaspoon -- Oral - Nectar Cup -- Oral - Nectar Straw -- Oral - Thin Teaspoon -- Oral - Thin Cup -- Oral - Thin Straw -- Oral - Puree -- Oral - Mech Soft -- Oral - Regular -- Oral - Multi-Consistency -- Oral - Pill -- Oral Phase - Comment --  CHL IP PHARYNGEAL PHASE 10/07/2019 Pharyngeal Phase Impaired Pharyngeal- Pudding Teaspoon -- Pharyngeal -- Pharyngeal- Pudding Cup -- Pharyngeal -- Pharyngeal- Honey Teaspoon -- Pharyngeal -- Pharyngeal- Honey Cup -- Pharyngeal -- Pharyngeal- Nectar Teaspoon -- Pharyngeal -- Pharyngeal- Nectar Cup -- Pharyngeal -- Pharyngeal- Nectar Straw -- Pharyngeal -- Pharyngeal- Thin Teaspoon -- Pharyngeal -- Pharyngeal- Thin Cup Reduced tongue base retraction;Reduced epiglottic inversion;Reduced anterior laryngeal mobility;Reduced airway/laryngeal closure;Pharyngeal residue - valleculae;Penetration/Aspiration before swallow Pharyngeal Material enters airway, passes BELOW cords then ejected out Pharyngeal- Thin Straw Reduced tongue base retraction;Reduced epiglottic inversion;Reduced anterior laryngeal mobility;Reduced airway/laryngeal closure;Pharyngeal residue -  valleculae;Penetration/Aspiration during swallow Pharyngeal Material enters airway, CONTACTS cords and not ejected out Pharyngeal- Puree Reduced tongue base retraction;Reduced epiglottic inversion;Reduced anterior laryngeal mobility;Reduced airway/laryngeal closure;Pharyngeal residue - valleculae Pharyngeal -- Pharyngeal- Mechanical Soft Reduced tongue base retraction;Reduced epiglottic inversion;Reduced anterior laryngeal mobility;Reduced airway/laryngeal closure;Pharyngeal residue - valleculae Pharyngeal -- Pharyngeal- Regular -- Pharyngeal -- Pharyngeal- Multi-consistency -- Pharyngeal -- Pharyngeal- Pill -- Pharyngeal -- Pharyngeal Comment --  CHL IP CERVICAL ESOPHAGEAL PHASE 10/07/2019 Cervical Esophageal Phase Impaired Pudding Teaspoon -- Pudding Cup -- Honey Teaspoon -- Honey Cup -- Nectar Teaspoon -- Nectar Cup -- Nectar Straw -- Thin Teaspoon -- Thin Cup Esophageal backflow into cervical esophagus Thin Straw Esophageal backflow into cervical esophagus Puree Esophageal backflow into cervical esophagus Mechanical Soft Esophageal backflow into cervical esophagus Regular -- Multi-consistency -- Pill -- Cervical Esophageal Comment -- Mahala Menghini., M.A. CCC-SLP Acute Rehabilitation Services Pager [phone] Office [phone] 10/07/2019, 3:39 PM              Recent Labs    10/06/19 0255 10/08/19 0601  WBC 9.2 9.4  HGB 9.5* 9.6*  HCT 30.0* 30.4*  PLT 381 453*   Recent Labs    10/06/19 0255 10/08/19 0601  NA 135 134*  K 3.9 3.8  CL 103 101  CO2 23 24  GLUCOSE 107* 107*  BUN 29* 17  CREATININE 1.39* 1.12*  CALCIUM 8.6* 8.6*    Intake/Output Summary (Last 24 hours) at 10/08/2019 0900 Last data filed at 10/07/2019 1700 Gross per 24 hour  Intake 0 ml  Output --  Net 0 ml     Pressure Injury 10/04/19 Back Medial;Lower Stage 1 -  Intact skin with non-blanchable redness of a localized area usually over a bony prominence. red blanchable area on spine (  Active)  10/04/19 1110  Location: Back   Location Orientation: Medial;Lower  Staging: Stage 1 -  Intact skin with non-blanchable redness of a localized area usually over a bony prominence.  Wound Description (Comments): red blanchable area on spine  Present on Admission: Yes    Physical Exam: BP (!) 111/53   Pulse 81   Temp 98.3 F (36.8 C)   Resp 16   Ht 5\' 2"  (1.575 m)   Wt 41.6 kg   SpO2 97%   BMI 16.77 kg/m  Constitutional: No distress . Vital signs reviewed.  Frail. HENT: Normocephalic.  Atraumatic. Eyes: EOMI. No discharge. Cardiovascular: No JVD.  RRR. Respiratory: Normal effort.  No stridor.  Bilateral clear to auscultation. GI: Non-distended.  BS +. Skin: Warm and dry.  Back ulcer not examined today. Left neck incision CDI Psych: Normal mood.  Normal behavior. Musc: No edema in extremities.  No tenderness in extremities. Neuro: Alert Mild dysarthria. Motor: RUE: Shoulder abduction 2/5, elbow flexion/extension 2/5, handgrip 3 -/5 Right lower extremity: Hip flexion, knee extension 3 -/5, ankle dorsiflexion 4+/5 Increase in tone noted right upper extremity   Assessment/Plan: 1. Functional deficits secondary to left frontoparietal infarct which require 3+ hours per day of interdisciplinary therapy in a comprehensive inpatient rehab setting.  Physiatrist is providing close team supervision and 24 hour management of active medical problems listed below.  Physiatrist and rehab team continue to assess barriers to discharge/monitor patient progress toward functional and medical goals   Care Tool:  Bathing              Bathing assist       Upper Body Dressing/Undressing Upper body dressing        Upper body assist      Lower Body Dressing/Undressing Lower body dressing            Lower body assist       Toileting Toileting    Toileting assist       Transfers Chair/bed transfer  Transfers assist  Chair/bed transfer activity did not occur: Safety/medical concerns         Locomotion Ambulation   Ambulation assist              Walk 10 feet activity   Assist           Walk 50 feet activity   Assist           Walk 150 feet activity   Assist           Walk 10 feet on uneven surface  activity   Assist           Wheelchair     Assist               Wheelchair 50 feet with 2 turns activity    Assist            Wheelchair 150 feet activity     Assist           Medical Problem List and Plan: 1.  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 Pt s/p left CEA.  Begin CIR evaluations 2.  Antithrombotics: -DVT/anticoagulation:  Pharmaceutical: Heparin cleared to resume per surgery             -antiplatelet therapy: on ASA 325/Plavix 75 mg daily.  3. Pain Management: Tylenol prn.  4. Mood: LCSW to follow for evaluation and support.              -  antipsychotic agents: N/A 5. Neuropsych: This patient is capable of making decisions on her own behalf. 6. Skin/Wound Care: Monitor incision for healing. Routine pressure relief measures.  7. Fluids/Electrolytes/Nutrition: Monitor I/Os.  8. HTN:   Inderal 60 mg bid  Monitor with increased mobility 9. CKD: Encourage fluid intake.   Creatinine 1.12 on 10/5  Continue to monitor 10. ABLA:   Hemoglobin 9.6 on 10/5  Continue to monitor 11. Anxiety/depression:  On valium bid to help manage anxiety state.   Fluoxetine DC'd due to Plavix 12. IBS--diarrhea: Questran on hold 13.  Hyponatremia  Sodium 134 on 10/5  Continue to monitor 14.  Severe hypoalbuminemia  Supplement initiated on 10/5  LOS: 1 days A FACE TO FACE EVALUATION WAS PERFORMED  Sorren Vallier Karis Juba 10/08/2019, 9:00 AM

## 2019-10-08 NOTE — Evaluation (Signed)
Occupational Therapy Assessment and Plan  Patient Details  Name: Elizabeth Tapia MRN: [id number] Date of Birth: 1929/08/22  OT Diagnosis: abnormal posture, cognitive deficits, flaccid hemiplegia and hemiparesis, hemiplegia affecting dominant side and muscle weakness (generalized) Rehab Potential: Rehab Potential (ACUTE ONLY): Good ELOS: 2-3 weeks   Today's Date: 10/08/2019 OT Individual Time: 1004-1100 OT Individual Time Calculation (min): 56 min     Hospital Problem: Principal Problem:   Cerebral infarction involving left carotid artery (HCC) Active Problems:   Hypoalbuminemia due to protein-calorie malnutrition (HCC)   Hyponatremia   Acute blood loss anemia   Essential hypertension   Right hemiparesis (HCC)   Past Medical History:  Past Medical History:  Diagnosis Date  . Anxiety   . CKD (chronic kidney disease)   . Depression   . Essential tremor   . HTN (hypertension)   . IBS (irritable bowel syndrome)    with diarrhea   Past Surgical History:  Past Surgical History:  Procedure Laterality Date  . ENDARTERECTOMY Left 10/04/2019   Procedure: ENDARTERECTOMY CAROTID;  Surgeon: Angelia Mould, MD;  Location: [facility];  Service: Vascular;  Laterality: Left;  . EYE SURGERY      Assessment & Plan Clinical Impression: Patient is a 86 y.o. year old female with history of HTN, CKD, anxiety/depression,tremors; was admitted on 09/11/2019 with dysarthria and right hemiparesis. Per reports patient had malaise with decrease in appetite for a few days prior to admission. CTA head/neck showed critical stenosis L-ICA>90%with string sign, hypoperfusion left parietal lobe with possible distal arterial occlusion L-MCA and prominent pannus with subluxation of C1 on C2 compatible with arthropathy and instability--question CPPD versus RA. MRI brain done showing multiple foci of acute infarcts within left frontal and parietal lobe in watershed distribution. She was started on heparin and  Dr. Doren Custard was consulted for input. He recommended left carotid endarterectomy however patient was initially reluctant to consider surgery. 2D echo done showing EF 70 to 75%, no wall abnormality and moderately elevated pulmonary arterial pressures. Carotid Dopplers done revealing L-ICA80-99%stenosis.  On 10/01, she is agreeable to undergo left carotid endarterectomy by Dr. Doren Custard. Postop on ASA 325 mg/Plavix 75 mg as well as Cleviprex for BP control. She also had difficulty swallowing post op with a chocking episode and bedside swallow done with recommendations for dysphagia 3, with staff to assist with self feeding but reports of increased coughing with all textures today. ST plans for MBS for objective evaluation. Her anxiety/tachycardia managed with resumption of propanolol and scheduling valium bid. Patient continues to have RUE weakness with balance deficits, difficulty with sequencing, dysphagia, aphonia and tachycardia with activity. CIR recommended due to functional decline.  Patient transferred to CIR on 10/07/2019 .    Patient currently requires max-total with basic self-care skills secondary to muscle weakness, decreased cardiorespiratoy endurance, impaired timing and sequencing, abnormal tone, unbalanced muscle activation, decreased coordination and decreased motor planning, decreased awareness, decreased problem solving, decreased safety awareness and decreased memory and decreased sitting balance, decreased standing balance, decreased postural control, hemiplegia and decreased balance strategies.  Prior to hospitalization, patient could complete ADLs with independent .  Patient will benefit from skilled intervention to decrease level of assist with basic self-care skills prior to discharge home with care partner.  Anticipate patient will require 24 hour supervision and minimal physical assistance and follow up home health.  OT - End of Session Activity Tolerance: Tolerates 30+ min  activity without fatigue Endurance Deficit: Yes Endurance Deficit Description: pt required multiple rest breaks OT  Assessment Rehab Potential (ACUTE ONLY): Good OT Barriers to Discharge: Incontinence OT Patient demonstrates impairments in the following area(s): Balance;Cognition;Endurance;Motor;Pain;Safety OT Basic ADL's Functional Problem(s): Grooming;Bathing;Dressing;Toileting OT Transfers Functional Problem(s): Toilet;Tub/Shower OT Additional Impairment(s): Fuctional Use of Upper Extremity OT Plan OT Intensity: Minimum of 1-2 x/day, 45 to 90 minutes OT Frequency: 5 out of 7 days OT Duration/Estimated Length of Stay: 2-3 weeks OT Treatment/Interventions: Balance/vestibular training;Cognitive remediation/compensation;Community reintegration;Discharge planning;Disease mangement/prevention;DME/adaptive equipment instruction;Functional electrical stimulation;Functional mobility training;Neuromuscular re-education;Pain management;Patient/family education;Psychosocial support;Self Care/advanced ADL retraining;Splinting/orthotics;Therapeutic Activities;Therapeutic Exercise;UE/LE Strength taining/ROM;UE/LE Coordination activities OT Self Feeding Anticipated Outcome(s): Setup OT Basic Self-Care Anticipated Outcome(s): Min assist OT Toileting Anticipated Outcome(s): Min assist OT Bathroom Transfers Anticipated Outcome(s): Min assist OT Recommendation Patient destination: Home Follow Up Recommendations: Home health OT;24 hour supervision/assistance Equipment Recommended: To be determined   OT Evaluation Precautions/Restrictions  Precautions Precautions: Fall Precaution Comments: R hemiparesis Restrictions Weight Bearing Restrictions: No General   Vital Signs Therapy Vitals Pulse Rate: 81 BP: (!) 111/53 Pain Pain Assessment Pain Scale: 0-10 Pain Score: 0-No pain Home Living/Prior Functioning Home Living Family/patient expects to be discharged to:: Private residence Living  Arrangements: Children Available Help at Discharge: Family, Available 24 hours/day (plans to d/c to son's house where he and his wife can provide 24/7 assist) Type of Home: House Home Access: Stairs to enter Technical brewer of Steps: 1-2 Entrance Stairs-Rails: Right Home Layout: One level Bathroom Shower/Tub: Gaffer, Chiropodist: Programmer, systems: Yes  Lives With: Family (grandson lives with her, son checks in frequently) IADL History Homemaking Responsibilities: No Prior Function Level of Independence: Independent with gait, Independent with basic ADLs, Independent with transfers, Independent with homemaking with ambulation  Able to Take Stairs?:  (pt reports she didn't have to go up/down stairs) Driving: Yes 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 Vision Baseline Vision/History: Wears glasses Wears Glasses: At all times (did not bring them to hospital) Vision Assessment?: No apparent visual deficits Perception  Perception: Within Functional Limits Praxis Praxis: Intact Cognition Overall Cognitive Status: Impaired/Different from baseline Arousal/Alertness: Awake/alert Orientation Level: Person;Place;Situation Person: Oriented Place: Oriented Situation: Oriented Year: 2021 Month: October Day of Week: Correct Memory: Impaired Memory Impairment: Retrieval deficit;Decreased short term memory Immediate Memory Recall: Sock;Blue;Bed Memory Recall Sock: Without Cue Memory Recall Blue: Without Cue Memory Recall Bed: Not able to recall Attention: Sustained Sustained Attention: Appears intact Awareness: Impaired Awareness Impairment: Intellectual impairment Problem Solving: Impaired Problem Solving Impairment: Verbal basic;Functional basic Safety/Judgment: Impaired Sensation Sensation Light Touch: Appears Intact Proprioception: Appears Intact Coordination Gross Motor Movements are Fluid and  Coordinated: No Fine Motor Movements are Fluid and Coordinated: No Coordination and Movement Description: grossly uncoordinated due to R hemiparesis, generalized weakness, decresaed balance/postural control Finger Nose Finger Test: unable to lift R UE and mild dysmetria on L UE 9 Hole Peg Test: unable to lift R UE and mild dysmetria on L UE Motor  Motor Motor: Hemiplegia;Abnormal postural alignment and control Motor - Skilled Clinical Observations: R hemiparesis, generalized weakness, decreased balance/postural control  Trunk/Postural Assessment  Cervical Assessment Cervical Assessment: Exceptions to [facility] (forward head) Thoracic Assessment Thoracic Assessment: Exceptions to [facility] (significant kyphosis) Lumbar Assessment Lumbar Assessment: Exceptions to [facility] (posterior pelvic tilt) Postural Control Postural Control: Deficits on evaluation  Balance Balance Balance Assessed: Yes Static Sitting Balance Static Sitting - Balance Support: Bilateral upper extremity supported;Feet supported Static Sitting - Level of Assistance: 3: Mod assist Dynamic Sitting Balance Dynamic Sitting - Balance Support: Feet supported;Bilateral upper extremity supported Dynamic Sitting - Level  of Assistance: 3: Mod Insurance risk surveyor Standing - Balance Support: No upper extremity supported Static Standing - Level of Assistance: 2: Max assist Extremity/Trunk Assessment RUE Assessment RUE Assessment: Exceptions to [facility] Passive Range of Motion (PROM) Comments: WNL Active Range of Motion (AROM) Comments: minimal shoulder flexion, elbow flexion to ~90* but no active elbow extension General Strength Comments: shoulder and elbow flexion 1-2/5, tone in wrist and fingers LUE Assessment LUE Assessment: Within Functional Limits Active Range of Motion (AROM) Comments: WNL General Strength Comments: grossly 4+/5  Care Tool Care Tool Self Care Eating   Eating Assist Level: Set up assist    Oral Care          Bathing Bathing activity did not occur: Refused            Upper Body Dressing(including orthotics)   Max assist          Lower Body Dressing (excluding footwear)  Total assist        Putting on/Taking off footwear  Dependent           Care Tool Toileting Toileting activity  Total assist       Care Tool Bed Mobility Roll left and right activity        Sit to lying activity        Lying to sitting edge of bed activity         Care Tool Transfers Sit to stand transfer   Sit to stand assist level: Maximal Assistance - Patient 25 - 49%    Chair/bed transfer   Chair/bed transfer assist level: Maximal Assistance - Patient 25 - 49%     Toilet transfer   Assist Level: Total Assistance - Patient < 25%     Care Tool Cognition Expression of Ideas and Wants     Understanding Verbal and Non-Verbal Content Understanding Verbal and Non-Verbal Content: Understands (complex and basic) - clear comprehension without cues or repetitions   Memory/Recall Ability *first 3 days only Memory/Recall Ability *first 3 days only: Current season;Staff names and faces;That he or she is in a hospital/hospital unit    Refer to Care Plan for [place] 1 OT Short Term Goal 1 (Week 1): Pt will complete LB dressing with max assist at sit > stand level OT Short Term Goal 2 (Week 1): Pt will complete toilet transfers max assist OT Short Term Goal 3 (Week 1): Pt will complete bathing with mod assist at sit > stand level OT Short Term Goal 4 (Week 1): Pt will utilize RUE as gross assist during self-care tasks  Recommendations for other services: None    Skilled Therapeutic Intervention OT eval completed with discussion of rehab process, OT purpose, POC, ELOS, and goals.  ADL assessment completed with focus on functional transfers, sit > stand, squat pivot transfers, and functional use of dominant RUE.  Pt received upright in w/c already dressed during PT  session. Pt declined bathing/dressing this session but agreeable to complete during OT session tomorrow.  Engaged in transfers with pt requiring total assist w/c <> toilet and max-total squat pivot to therapy mat.  Engaged in trunk control in sitting and standing with use of mirror for visual feedback for improved postural control.  Pt required supervision to mod assist for sitting balance.  Mod-max assist sit > stand with improvements with weight shifting when given visual input from mirror for weight shifting and postural control.  Engaged in weight shifting in  standing as needed to progress ambulation, transfers, and LB dressing.  Utilized UE Ranger in sitting with focus on RUE NMR of shoulder flexion and horizontal abduction/adduction.  Pt motivated to progress.  Educated on typical recovery process in regards to motor return in UE. Pt returned to w/c max assist squat pivot and remained upright in w/c with seat belt alarm on and all needs in reach.  ADL ADL Toilet Transfer: Dependent Toilet Transfer Method: Squat pivot ADL Comments: Pt dressed during PT eval and declined bathing or dressing during eval/treatment session Mobility  Bed Mobility Bed Mobility: Rolling Right Rolling Right: Contact Guard/Touching assist Transfers Sit to Stand: Maximal Assistance - Patient 25-49% Stand to Sit: Moderate Assistance - Patient 50-74%   Discharge Criteria: Patient will be discharged from OT if patient refuses treatment 3 consecutive times without medical reason, if treatment goals not met, if there is a change in medical status, if patient makes no progress towards goals or if patient is discharged from hospital.  The above assessment, treatment plan, treatment alternatives and goals were discussed and mutually agreed upon: by patient  Simonne Come 10/08/2019, 3:35 PM

## 2019-10-08 NOTE — Progress Notes (Signed)
Inpatient Rehabilitation Center Individual Statement of Services  Patient Name:  Elizabeth Tapia  Date:  10/08/2019  Welcome to the Inpatient Rehabilitation Center.  Our goal is to provide you with an individualized program based on your diagnosis and situation, designed to meet your specific needs.  With this comprehensive rehabilitation program, you will be expected to participate in at least 3 hours of rehabilitation therapies Monday-Friday, with modified therapy programming on the weekends.  Your rehabilitation program will include the following services:  Physical Therapy (PT), Occupational Therapy (OT), Speech Therapy (ST), 24 hour per day rehabilitation nursing, Care Coordinator, Rehabilitation Medicine, Nutrition Services and Pharmacy Services  Weekly team conferences will be held on Wednesday to discuss your progress.  Your Inpatient Rehabilitation Care Coordinator will talk with you frequently to get your input and to update you on team discussions.  Team conferences with you and your family in attendance may also be held.  Expected length of stay: 2-3 Weeks  Overall anticipated outcome: min-CGA level  Depending on your progress and recovery, your program may change. Your Inpatient Rehabilitation Care Coordinator will coordinate services and will keep you informed of any changes. Your Inpatient Rehabilitation Care Coordinator's name and contact numbers are listed  below.  The following services may also be recommended but are not provided by the Inpatient Rehabilitation Center:    Home Health Rehabiltiation Services  Outpatient Rehabilitation Services    Arrangements will be made to provide these services after discharge if needed.  Arrangements include referral to agencies that provide these services.  Your insurance has been verified to be: Medicare & BCBS Your primary doctor is:  Tarri Fuller  Pertinent information will be shared with your doctor and your insurance  company.  Inpatient Rehabilitation Care Coordinator:  Dossie Der, [place] [phone] or Luna Glasgow  Information discussed with and copy given to patient by: Lucy Chris, 10/08/2019, 1:51 PM

## 2019-10-08 NOTE — Progress Notes (Signed)
Patient Details  Name: Elizabeth Tapia MRN: [id number] Date of Birth: 1929-02-10  Today's Date: 10/08/2019  Hospital Problems: Principal Problem:   Cerebral infarction involving left carotid artery (HCC) Active Problems:   Hypoalbuminemia due to protein-calorie malnutrition (HCC)   Hyponatremia   Acute blood loss anemia   Essential hypertension   Right hemiparesis [facility])  Past Medical History:  Past Medical History:  Diagnosis Date  . Anxiety   . CKD (chronic kidney disease)   . Depression   . Essential tremor   . HTN (hypertension)   . IBS (irritable bowel syndrome)    with diarrhea   Past Surgical History:  Past Surgical History:  Procedure Laterality Date  . ENDARTERECTOMY Left 10/04/2019   Procedure: ENDARTERECTOMY CAROTID;  Surgeon: Chuck Hint, MD;  Location: [facility] OR;  Service: Vascular;  Laterality: Left;  . EYE SURGERY     Social History:  reports that she has never smoked. She has never used smokeless tobacco. No history on file for alcohol use and drug use.  Family / Support Systems Marital Status: Widow/Widower Patient Roles: Parent, Other (Comment) (grandparent) Children: Janet [place] [phone] Pt has two other children who live out of town Other Supports: other children and church members. Her grandson was staying with her prior to admission Anticipated Caregiver: Fayrene Fearing and Fredirick Maudlin and daughter in-law Ability/Limitations of Caregiver: Both are retired and can assist if needed. Caregiver Availability: 24/7 Family Dynamics: Close knit family all pull together when someone is in need. Pt has always been independent and able to take care of herself she does not like asking for help. She will do her best here and hopefully do well.  Social History Preferred language: English Religion:  Cultural Background: No issues Education: HS Read: Yes Write: Yes Employment Status: Retired Marine scientist Issues: No issues Guardian/Conservator:  None-accoridng to MD pt is capable of making her own decisions while here. Son will be involved and here often to see her progress   Abuse/Neglect Abuse/Neglect Assessment Can Be Completed: Yes Physical Abuse: Denies Verbal Abuse: Denies Sexual Abuse: Denies Exploitation of patient/patient's resources: Denies Self-Neglect: Denies  Emotional Status Pt's affect, behavior and adjustment status: Pt is motivated to do well and get back to her independent level, she is agreeable to moving in with son and his wife at discharge. She would like to be able to do for herself and hopes too. Recent Psychosocial Issues: other health issues-grandson recently moved in to be there with her and assisted with home management Psychiatric History: History of depression/anxiety takes meds for this and finds them helpful Substance Abuse History: No issues  Patient / Family Perceptions, Expectations & Goals Pt/Family understanding of illness & functional limitations: Pt and son can explain her stroke and deficits. Both have spoken with the MD and feel they have a good understanding of her treatment plan going forward. Premorbid pt/family roles/activities: mom, grandmother, church Ambulance person, friend, etc Anticipated changes in roles/activities/participation: resume Pt/family expectations/goals: Pt states: " I want to be able to do for myself, I hope anyway." Fayrene Fearing states: " I hope she does well there, but will take any services she can have at home."  [employer]: None Premorbid Home Care/DME Agencies: Other (Comment) (has cane, rw and built in tub seat) Transportation available at discharge: family provides pt does not drive  Discharge Planning Living Arrangements: Other relatives Support Systems: Children, Other relatives, Church/faith community Type of Residence: Private residence Insurance Resources: [employer], Media planner (specify) Herbalist) Financial Resources: Social  Security Financial Screen Referred: No Living Expenses: Own Money Management: Family Does the patient have any problems obtaining your medications?: No Home Management: Family assisted with home management-pt did some Patient/Family Preliminary Plans: Plan now to go to son's home at discharge due to between he and daughter in-law someone is there 24/7. pt's grandson was staying with her prior to discharge. Son and daughter in-law to be here tomorrow, will see them then. Care Coordinator Anticipated Follow Up Needs: HH/OP  Clinical Impression Pleasant female who is willing to improve and get back to her prior function if she can. She plans to move in with son and daughter in-law at discharge for someone to be there to assist if needed. Son and daughter in-law to be here tomorrow and see her in therapies. Await team evaluations and work on discharge needs.  Lucy Chris 10/08/2019, 1:48 PM

## 2019-10-09 ENCOUNTER — Inpatient Hospital Stay (HOSPITAL_COMMUNITY): Payer: Medicare Other | Admitting: Occupational Therapy

## 2019-10-09 ENCOUNTER — Inpatient Hospital Stay (HOSPITAL_COMMUNITY): Payer: Medicare Other

## 2019-10-09 NOTE — Progress Notes (Signed)
Patient ID: Elizabeth Tapia, female   DOB: [birth year], 86 y.o.   MRN: [id number]  Met with pt and son who is here to visit to discuss team conference goals supervision-min assist level and target discharge date 10/26. Pt feels she is being treated so well she won;t want to leave. Discussed closer to discharge date will ask for son and daughter in-law to come in and do hands on therapy. Continue to work on discharge needs.

## 2019-10-09 NOTE — Patient Care Conference (Signed)
Inpatient RehabilitationTeam Conference and Plan of Care Update Date: 10/09/2019   Time: 11:34 AM    Patient Name: Elizabeth Tapia      Medical Record Number: [id number]  Date of Birth: 1929/06/14 Sex: Female         Room/Bed: 4M03C/4M03C-01 Payor Info: Payor: MEDICARE / Plan: MEDICARE PART A AND B / Product Type: *No Product type* /    Admit Date/Time:  10/07/2019  4:56 PM  Primary Diagnosis:  Cerebral infarction involving left carotid artery [facility])  Hospital Problems: Principal Problem:   Cerebral infarction involving left carotid artery (HCC) Active Problems:   Hypoalbuminemia due to protein-calorie malnutrition (HCC)   Hyponatremia   Acute blood loss anemia   Essential hypertension   Right hemiparesis [facility])    Expected Discharge Date: Expected Discharge Date: 10/29/19  Team Members Present: Physician leading conference: Dr. Sula Soda Care Coodinator Present: Chana Bode, RN, BSN, CRRN;Other (comment) Kriste Basque Dupree, SW) Nurse Present: Other (comment) Joycelyn Das , RN) PT Present: Raechel Chute, PT OT Present: Rosalio Loud, OT SLP Present: Colin Benton, SLP PPS Coordinator present : Fae Pippin, SLP     Current Status/Progress Goal Weekly Team Focus  Bowel/Bladder   Pt is incontinent of bowel/bladder. LBM-10/08/19  To become more continent of B/B.  Assess tolieting needs prn.   Swallow/Nutrition/ Hydration   dys 3 textures and thin liquids, mod-min A recall/use of swallow strategies (effortful swallow, remaining up right, no straw)  Supervision A  swallow strategies and trials of regular textures   ADL's   max-total assist squat pivot transfers, max assist sit > stand.  Did not complete bathing or dressing on eval as already dressed during PT eval  Min assist  ADL retraining, postural control, transfers, sit > stand, RUE NMR   Mobility   bed mobility CGA with increased time, transfers max A, WC mobility 55ft mod A  supervision  functional mobility/transfers,  generalized strengthening, dynamic standing balance/coordination, ambulation, improved activity tolerance.   Communication   80% intelligibile at sentence level, Mod A to clear vocal wetness and increasing vocal intensity  Supervision A  RMT, speech strategies   Safety/Cognition/ Behavioral Observations  Mod A  Supervision A  recal with aid, basic to mildly complex problem solving, error awareness  and sustained attention in more complex tasks   Pain   No complaints of pain or signs of discomfort.  To remain pain free.  Assess pain q shift or prn.   Skin   Incision to right side of neck w/ skin glue. Also has foam over upper back/neck area that was a blister.  To promote healing and prevent further skin breakdown.  Assess skin q shift or prn.     Discharge Planning:  Going to live with son and daughter in-law who can provide assist-made aware will need 24/7 care at discharge. Will be here today.   Team Discussion: Anemia and AKI being monitored by MD. At risk for R UE subluxation; therapy addressing positioning and HS hand splints.  Patient on target to meet rehab goals: yes, currently max assist for transfers and toileting with min assist goals set.  *See Care Plan and progress notes for long and short-term goals.   Revisions to Treatment Plan:  Downgraded goals for ambulation Teaching Needs: Transfers, toileting, positioning, medications, etc.   Current Barriers to Discharge: Decreased caregiver support  Possible Resolutions to Barriers: Family Education     Medical Summary Current Status: hyponatremia, anemia, labile BP, denies pain, BMI 16.77  Barriers  to Discharge: Medical stability;Decreased family/caregiver support;Nutrition means  Barriers to Discharge Comments: hyponatremia, anemia, labile BP, main social support is her son, decreased strength, BMI 16.77 Possible Resolutions to [employer] Focus: Monitor sodium, hgb weekly; monitor BP TID, caregiver training, wrist  orthosis, dietary consult for malnourishment   Continued Need for Acute Rehabilitation Level of Care: The patient requires daily medical management by a physician with specialized training in physical medicine and rehabilitation for the following reasons: Direction of a multidisciplinary physical rehabilitation program to maximize functional independence : Yes Medical management of patient stability for increased activity during participation in an intensive rehabilitation regime.: Yes Analysis of laboratory values and/or radiology reports with any subsequent need for medication adjustment and/or medical intervention. : Yes   I attest that I was present, lead the team conference, and concur with the assessment and plan of the team.   Chana Bode B 10/09/2019, 1:11 PM

## 2019-10-09 NOTE — Progress Notes (Addendum)
[place] PHYSICAL MEDICINE & REHABILITATION PROGRESS NOTE  Subjective/Complaints: No complaints this morning. Denies pain, constipation, insomnia, dizziness Feels therapy is going well. Discussed that her BP is a little soft  ROS: Denies CP, SOB, N/V/D  Objective: Vital Signs: Blood pressure (!) 111/53, pulse 79, temperature 98.3 F (36.8 C), resp. rate 14, height 5\' 2"  (1.575 m), weight 41.6 kg, SpO2 100 %. DG Swallowing Func-Speech Pathology  Result Date: 10/07/2019 Objective Swallowing Evaluation: Type of Study: MBS-Modified Barium Swallow Study  Patient Details Name: August Labreck MRN: [id number] Date of Birth: 28-May-1929 Today's Date: 10/07/2019 Time: SLP Start Time (ACUTE ONLY): 1347 -SLP Stop Time (ACUTE ONLY): 1405 SLP Time Calculation (min) (ACUTE ONLY): 18 min Past Medical History: Past Medical History: Diagnosis Date . Anxiety  . CKD (chronic kidney disease)  . Depression  . Essential tremor  . HTN (hypertension)  . IBS (irritable bowel syndrome)   with diarrhea Past Surgical History: Past Surgical History: Procedure Laterality Date . ENDARTERECTOMY Left 10/04/2019  Procedure: ENDARTERECTOMY CAROTID;  Surgeon: Chuck Hint, MD;  Location: [facility] OR;  Service: Vascular;  Laterality: Left; . EYE SURGERY   HPI:  Ms. Coppins is an 86 year old female with a history of hypertension, hyperlipidemia, CKD IIIa, ?rheumatoid arthritis, anxiety, and depression who presents with an acute stroke. Patient was in her usual state of health last night and awoke this morning with slurred speech and R sided weakness. She was unable to walk. She lives with her grandson and called to him for help. She has no history of prior stroke or known vascular disease.   10/01/19 MRI head indicated: This demonstrates multiple foci of acute infarcts within the left frontal and parietal lobes in a watershed distribution.  Underwent left CEA 10/04/19.  Subjective: pt alert, cooperative Assessment / Plan / Recommendation  CHL IP CLINICAL IMPRESSIONS 10/07/2019 Clinical Impression Pt has a mild pharyngeal and cervical esophageal dysphagia, with oral phase relatively functional. She has reduced base of tongue retraction and anterior hyoid movement, with reduced epiglottic inversion and laryngeal vestibule closure. Her UES opening also appears to be somewhat reduced and there is an outpouching in which barium collects during the swallow. Backflow occurs between the opening of the UES and the location of this outpouching. Of note, barium was also noted to remain more distally in the esophagus in a more tortuous looking shape, with areas that also appeared to be dilated at rest.  Pt has moderate amounts of vallecular residue that does not clear her pharynx during the swallow that is more prevalent with solids than with liquids. Trace amounts of thin liquids also enter the airway with larger boluses. Aspiration occurred with a spontaneous cough response, which was also elicited when penetration reached the level of the vocal folds. Her sensation appears to be good considering this response to trace stimulation, as well as her spontaneous subswallows that she performs to reduce (but not clear) the residue from her pharynx. Pt can achieve better efficiency with cues for hard swallows, and she can protect her airway better when taking small, single sips via cup. Recommend continuing current diet with use of these swallowing strategies to maximize safety.   SLP Visit Diagnosis Dysphagia, pharyngoesophageal phase (R13.14) Attention and concentration deficit following -- Frontal lobe and executive function deficit following -- Impact on safety and function Mild aspiration risk   CHL IP TREATMENT RECOMMENDATION 10/07/2019 Treatment Recommendations Therapy as outlined in treatment plan below   Prognosis 10/07/2019 Prognosis for Safe Diet Advancement Good  Barriers to Reach Goals Time post onset Barriers/Prognosis Comment -- CHL IP DIET RECOMMENDATION  10/07/2019 SLP Diet Recommendations Dysphagia 3 (Mech soft) solids;Thin liquid Liquid Administration via Cup;No straw Medication Administration Crushed with puree Compensations Slow rate;Small sips/bites;Effortful swallow Postural Changes Seated upright at 90 degrees;Remain semi-upright after after feeds/meals (Comment)   CHL IP OTHER RECOMMENDATIONS 10/07/2019 Recommended Consults -- Oral Care Recommendations Oral care BID Other Recommendations --   CHL IP FOLLOW UP RECOMMENDATIONS 10/07/2019 Follow up Recommendations Inpatient Rehab   CHL IP FREQUENCY AND DURATION 10/07/2019 Speech Therapy Frequency (ACUTE ONLY) min 2x/week Treatment Duration 2 weeks      CHL IP ORAL PHASE 10/07/2019 Oral Phase WFL Oral - Pudding Teaspoon -- Oral - Pudding Cup -- Oral - Honey Teaspoon -- Oral - Honey Cup -- Oral - Nectar Teaspoon -- Oral - Nectar Cup -- Oral - Nectar Straw -- Oral - Thin Teaspoon -- Oral - Thin Cup -- Oral - Thin Straw -- Oral - Puree -- Oral - Mech Soft -- Oral - Regular -- Oral - Multi-Consistency -- Oral - Pill -- Oral Phase - Comment --  CHL IP PHARYNGEAL PHASE 10/07/2019 Pharyngeal Phase Impaired Pharyngeal- Pudding Teaspoon -- Pharyngeal -- Pharyngeal- Pudding Cup -- Pharyngeal -- Pharyngeal- Honey Teaspoon -- Pharyngeal -- Pharyngeal- Honey Cup -- Pharyngeal -- Pharyngeal- Nectar Teaspoon -- Pharyngeal -- Pharyngeal- Nectar Cup -- Pharyngeal -- Pharyngeal- Nectar Straw -- Pharyngeal -- Pharyngeal- Thin Teaspoon -- Pharyngeal -- Pharyngeal- Thin Cup Reduced tongue base retraction;Reduced epiglottic inversion;Reduced anterior laryngeal mobility;Reduced airway/laryngeal closure;Pharyngeal residue - valleculae;Penetration/Aspiration before swallow Pharyngeal Material enters airway, passes BELOW cords then ejected out Pharyngeal- Thin Straw Reduced tongue base retraction;Reduced epiglottic inversion;Reduced anterior laryngeal mobility;Reduced airway/laryngeal closure;Pharyngeal residue -  valleculae;Penetration/Aspiration during swallow Pharyngeal Material enters airway, CONTACTS cords and not ejected out Pharyngeal- Puree Reduced tongue base retraction;Reduced epiglottic inversion;Reduced anterior laryngeal mobility;Reduced airway/laryngeal closure;Pharyngeal residue - valleculae Pharyngeal -- Pharyngeal- Mechanical Soft Reduced tongue base retraction;Reduced epiglottic inversion;Reduced anterior laryngeal mobility;Reduced airway/laryngeal closure;Pharyngeal residue - valleculae Pharyngeal -- Pharyngeal- Regular -- Pharyngeal -- Pharyngeal- Multi-consistency -- Pharyngeal -- Pharyngeal- Pill -- Pharyngeal -- Pharyngeal Comment --  CHL IP CERVICAL ESOPHAGEAL PHASE 10/07/2019 Cervical Esophageal Phase Impaired Pudding Teaspoon -- Pudding Cup -- Honey Teaspoon -- Honey Cup -- Nectar Teaspoon -- Nectar Cup -- Nectar Straw -- Thin Teaspoon -- Thin Cup Esophageal backflow into cervical esophagus Thin Straw Esophageal backflow into cervical esophagus Puree Esophageal backflow into cervical esophagus Mechanical Soft Esophageal backflow into cervical esophagus Regular -- Multi-consistency -- Pill -- Cervical Esophageal Comment -- Mahala Menghini., M.A. CCC-SLP Acute Rehabilitation Services Pager [phone] Office [phone] 10/07/2019, 3:39 PM              Recent Labs    10/08/19 0601  WBC 9.4  HGB 9.6*  HCT 30.4*  PLT 453*   Recent Labs    10/08/19 0601  NA 134*  K 3.8  CL 101  CO2 24  GLUCOSE 107*  BUN 17  CREATININE 1.12*  CALCIUM 8.6*    Intake/Output Summary (Last 24 hours) at 10/09/2019 0848 Last data filed at 10/08/2019 1836 Gross per 24 hour  Intake 347 ml  Output --  Net 347 ml        Physical Exam: BP (!) 111/53   Pulse 79   Temp 98.3 F (36.8 C)   Resp 14   Ht 5\' 2"  (1.575 m)   Wt 41.6 kg   SpO2 100%   BMI 16.77 kg/m  General: Alert, No apparent distress  HEENT: Head is normocephalic, atraumatic, PERRLA, EOMI, sclera anicteric, oral mucosa pink and moist,  dentition intact, ext ear canals clear,  Neck: Supple without JVD or lymphadenopathy Heart: Reg rate and rhythm. No murmurs rubs or gallops Chest: CTA bilaterally without wheezes, rales, or rhonchi; no distress Abdomen: Soft, non-tender, non-distended, bowel sounds positive. Extremities: No clubbing, cyanosis, or edema. Pulses are 2+ Skin: Warm and dry.  Back ulcer not examined today. Left neck incision CDI Psych: Normal mood.  Normal behavior. Musc: No edema in extremities.  No tenderness in extremities. Neuro: Alert Mild dysarthria. Motor: RUE: Shoulder abduction 2/5, elbow flexion/extension 2/5, handgrip 3 -/5 Right lower extremity: Hip flexion, knee extension 3 -/5, ankle dorsiflexion 4+/5 Increase in tone noted right upper extremity    Assessment/Plan: 1. Functional deficits secondary to left frontoparietal infarct which require 3+ hours per day of interdisciplinary therapy in a comprehensive inpatient rehab setting.  Physiatrist is providing close team supervision and 24 hour management of active medical problems listed below.  Physiatrist and rehab team continue to assess barriers to discharge/monitor patient progress toward functional and medical goals   Care Tool:  Bathing  Bathing activity did not occur: Refused           Bathing assist       Upper Body Dressing/Undressing Upper body dressing   What is the patient wearing?: Pull over shirt    Upper body assist Assist Level: Maximal Assistance - Patient 25 - 49%    Lower Body Dressing/Undressing Lower body dressing      What is the patient wearing?: Pants, Incontinence brief     Lower body assist Assist for lower body dressing: Total Assistance - Patient < 25%     Toileting Toileting    Toileting assist Assist for toileting: Total Assistance - Patient < 25%     Transfers Chair/bed transfer  Transfers assist  Chair/bed transfer activity did not occur: Safety/medical concerns  Chair/bed  transfer assist level: Maximal Assistance - Patient 25 - 49%     Locomotion Ambulation   Ambulation assist   Ambulation activity did not occur: Safety/medical concerns (R hemi, fatigue,generalized weakness, decresed balance/postural control)          Walk 10 feet activity   Assist  Walk 10 feet activity did not occur: Safety/medical concerns (R hemi, fatigue,generalized weakness, decresed balance/postural control)        Walk 50 feet activity   Assist Walk 50 feet with 2 turns activity did not occur: Safety/medical concerns (R hemi, fatigue,generalized weakness, decresed balance/postural control)         Walk 150 feet activity   Assist Walk 150 feet activity did not occur: Safety/medical concerns (R hemi, fatigue,generalized weakness, decresed balance/postural control)         Walk 10 feet on uneven surface  activity   Assist Walk 10 feet on uneven surfaces activity did not occur: Safety/medical concerns (R hemi, fatigue,generalized weakness, decresed balance/postural control)         Wheelchair     Assist Will patient use wheelchair at discharge?: Yes Type of Wheelchair: Manual    Wheelchair assist level: Moderate Assistance - Patient 50 - 74% Max wheelchair distance: 78ft    Wheelchair 50 feet with 2 turns activity    Assist        Assist Level: Total Assistance - Patient < 25%   Wheelchair 150 feet activity     Assist      Assist Level: Dependent - Patient 0%  Medical Problem List and Plan: 1.  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 Pt s/p left CEA.  -Interdisciplinary Team Conference today  2.  Antithrombotics: -DVT/anticoagulation:  Pharmaceutical: Heparin cleared to resume per surgery             -antiplatelet therapy: on ASA 325/Plavix 75 mg daily.  3. Pain Management: Denies pain 4. Mood: LCSW to follow for evaluation and  support.              -antipsychotic agents: N/A 5. Neuropsych: This patient is capable of making decisions on her own behalf. 6. Skin/Wound Care: Monitor incision for healing. Routine pressure relief measures.  7. Fluids/Electrolytes/Nutrition: Monitor I/Os.  8. HTN:   Inderal 60 mg bid  Monitor with increased mobility  10/6: BP soft this morning. Flowsheet reviewed- has been labile this week. Continue to monitor TID.  9. CKD: Encourage fluid intake.   Creatinine 1.12 on 10/5  Continue to monitor 10. ABLA:   Hemoglobin 9.6 on 10/5  Continue to monitor 11. Anxiety/depression:  On valium bid to help manage anxiety state. Well controlled with medication.   Fluoxetine DC'd due to Plavix 12. IBS--diarrhea: Questran on hold 13.  Hyponatremia  Sodium 134 on 10/5  Continue to monitor 14.  Severe hypoalbuminemia  Supplement initiated on 10/5 15. BMI 16.77- dietary consult for calorie count, assess nutritional needs.   LOS: 2 days A FACE TO FACE EVALUATION WAS PERFORMED  Drema Pry Dianey Suchy 10/09/2019, 8:48 AM

## 2019-10-09 NOTE — Progress Notes (Addendum)
Speech Language Pathology Daily Session Note  Patient Details  Name: Gladyse Corvin MRN: [id number] Date of Birth: 06-02-1929  Today's Date: 10/09/2019 SLP Individual Time: 1400-1458 SLP Individual Time Calculation (min): 58 min  Short Term Goals: Week 1: SLP Short Term Goal 1 (Week 1): Pt will demonstrate sustained attention in 30 minute intervals with min A verbal cues during mildly complex tasks. SLP Short Term Goal 2 (Week 1): Pt will demonstrate basic to mildly complex problem solving skills with min A verbal cues. SLP Short Term Goal 3 (Week 1): Pt will demonstrate self-monitoring and self-correcting of functional errors in problem solving tasks with min A verbal cues. SLP Short Term Goal 4 (Week 1): Pt will demonstrate recall of daily information (including speech and swallow strategies) with min A verbal cues for visual aids. SLP Short Term Goal 5 (Week 1): Pt will utilize swallow strategies when consuming dys 3 textures and thin liquids via cup with minimal overt s/s aspiration and min A verbal cues. SLP Short Term Goal 6 (Week 1): Pt will increase vocal intensity to 85% intelligibility at the sentence level and clear wet vocal quality with min A verbal cues.  Skilled Therapeutic Interventions:Skilled ST services focused on swallow and cognitive skills. SLP assisted NT and Nurse in changing pt's brief on the [place]. Pt required min A verbal cues to follow commands. Pt demonstrated intermittent wet vocal quality and required mod A verbal cues to clear with throat clear and swallow. SLP provided basic money management task (counting and displaying change), pt required max A verbal cues for recall (with SLP providing written aid) and mod A verbal cues for use of visual aid and error awareness. Pt demonstrated no initial recall of no straw swallow precautions. SLP posted sign in room, pt required supervision A verbal cues to recall precaution with sign, however when SLP presented pt with straw  total A to utilize precaution. Pt demonstrated no overt s/s aspiration when consuming thin liquid via cup. SLP also facilitated sustained attention, basic problem solving and recall skills in card sorting task by color then by shape, pt demonstrated mod I for problem solving and sustained attention with supervision A verbal cues for recall of sorting method with visual aid. Pt was left in room with call bell within reach and chair alarm set. SLP recommends to continue skilled services.     Pain Pain Assessment Pain Score: 0-No pain  Therapy/Group: Individual Therapy  Atiya Yera  [facility] 10/09/2019, 3:57 PM

## 2019-10-09 NOTE — Progress Notes (Signed)
Physical Therapy Session Note  Patient Details  Name: Elizabeth Tapia MRN: [id number] Date of Birth: 08-17-1929  Today's Date: 10/09/2019 PT Individual Time: 2979-8921 and 1941-7408 PT Individual Time Calculation (min): 43 min and 41 min  Short Term Goals: Week 1:  PT Short Term Goal 1 (Week 1): Pt will perform bed mobility with supervision PT Short Term Goal 2 (Week 1): Pt wil transfer sit<>stand with LRAD min A PT Short Term Goal 3 (Week 1): Pt will perform simulated car transfer with LRAD mod A  Skilled Therapeutic Interventions/Progress Updates:   Treatment Session 1: 0800-0843 43 min  Received pt sitting in bed, pt agreeable to therapy, and denied any pain during session. Session with emphasis on functional mobility/transfers, dressing, generalized strengthening, dynamic standing balance/coordination, toileting, and improved activity tolerance. Donned pants in supine with max A and pt rolled L/R with min A and use of bedrails and required max A to pull pants over hips. Pt transferred supine<>sitting EOB with HOB elevated and use of bedrails with supervision and increased time to scoot hip to EOB. Pt transferred bed<>WC stand<>pivot without AD and max A with cues for upright posture, knee extension, hand placement, and turning technique. Pt brushed teeth at sink sitting in WC with min A to manage toothpaste and basin for pt to spit. Therapist assisted with combing pt's hair and pt washed face with supervision. Pt reported urge to use restroom and transferred WC<>toilet with bedside commode over top with max A and required total A for clothing management. Pt with flexed trunk, bilateral knee flexion, and posterior lean in standing. Pt with difficulty stepping feet when transferring despite verbal cues. Pt able to void and perform peri-care sitting on toilet with supervision x 2 trials. Pt transferred sit<>stand max A using grab bar and required 2 standing attempts to get brief and pants over hips as  pt with difficulty achieving upright posture and pushing posteriorly. Stand<>pivot toilet<>WC max A. Worked on sit<>stands x 1 at sink with emphasis on posture. Pt able to stand with mod A while holding onto sink with L UE. Pt able to achieve upright position with visual cues to look in mirror and verbal and tactile cues to "touch hips to sink counter". Pt able to sustain standing balance for ~30 seconds with min A for balance prior to requesting to sit. Concluded session with pt sitting in WC, needs within reach, and seatbelt alarm on. Therapist assisted with positioning R UE on pillow for improved hemibody positioning.   Treatment Session 2: 1300-1341 41 min Received pt sitting in [facility] with son present at bedside, pt agreeable to therapy, and denied any pain during session. Session with emphasis on functional mobility/transfers, generalized strengthening, dynamic standing balance/coordination, and improved activity tolerance. Provided pt with tissues and doffed R wrist splint and pt transported to therapy gym in [facility] total A for time management purposes. Worked on standing balance, posture, and positioning in standing frame for the following time frames using mirror for visual feedback: Trial 1: 2 minutes Trial 2: 1.5 minutes Trial 3: 5 minutes and 10 seconds with dual task of putting number circles in ascending and descending order x 2 trials with 100% accurracy.  Pt reported her legs felt weak when standing and noted pt with increased posterior lean with increased fatigue. Pt with fear of falling and max required cues for anterior weight shifting, upright gaze, and to push through LEs rather than relying on standing frame to hold her up. Pt required total A to  manage R UE. Worked on gentle stretching of R wrist and finger flexors with emphasis on weight bearing through R hand while in standing frame. However, pt with significant tone especially in 4th digit of R hand and unable to fully extend. Pt transported  back to room in [facility] total A and donned R wrist splint and positioned R UE on pillow for support. Concluded session with pt sitting in WC, needs within reach, and seatbelt alarm on.  Therapy Documentation Precautions:  Precautions Precautions: Fall Precaution Comments: R hemiparesis Restrictions Weight Bearing Restrictions: No  Therapy/Group: Individual Therapy Martin Majestic PT, DPT   10/09/2019, 7:20 AM

## 2019-10-09 NOTE — Plan of Care (Signed)
  Problem: RH Expression Communication Goal: LTG Patient will increase speech intelligibility (SLP) Description: LTG: Patient will increase speech intelligibility at word/phrase/conversation level with cues, % of the time (SLP) Flowsheets (Taken 10/09/2019 0753) LTG: Patient will increase speech intelligibility (SLP): (error not added on evaluation date) Supervision Level: (sentence) -- Percent of time patient will use intelligible speech: 85% Note: Error not added on evaluation date

## 2019-10-09 NOTE — Progress Notes (Signed)
Occupational Therapy Session Note  Patient Details  Name: Elizabeth Tapia MRN: [id number] Date of Birth: 06-04-1929  Today's Date: 10/09/2019 OT Individual Time: 2542-7062 OT Individual Time Calculation (min): 57 min    Short Term Goals: Week 1:  OT Short Term Goal 1 (Week 1): Pt will complete LB dressing with max assist at sit > stand level OT Short Term Goal 2 (Week 1): Pt will complete toilet transfers max assist OT Short Term Goal 3 (Week 1): Pt will complete bathing with mod assist at sit > stand level OT Short Term Goal 4 (Week 1): Pt will utilize RUE as gross assist during self-care tasks  Skilled Therapeutic Interventions/Progress Updates:    Treatment session with focus on self-care retraining, sit > stand, upright standing posture, and RUE positioning during bathing and dressing.  Pt received upright in w/c agreeable to bathing and dressing this session.  Engaged in bathing seated at sink with cues for sequencing and positioning of RUE for safety.  Therapist providing facilitation at RUE to assist with incorporation into aspects of bathing and dressing.  Engaged in sit > stand at sink with mod-max assist, utilized mirror for visual input for increased upright standing posture.  Therapist assisted with washing buttocks and provided max assist for LB dressing and pulling pants over hips.  Educated on hemi-technique with LB dressing - pt would benefit from having personal clothing brought in.  Pt's son Elizabeth Tapia arrived at end of session.  Discussed home setup.  Son reports 1 step to back entrance and 8 steps to front entrance.  Son reports having tub/shower and walk-in shower as options for bathing at home - will need DME for shower.  Pt's son to bring in clothing for future therapy sessions.  Therapy Documentation Precautions:  Precautions Precautions: Fall Precaution Comments: R hemiparesis Restrictions Weight Bearing Restrictions: No General:   Vital Signs: Therapy Vitals Pulse  Rate: 79 BP: (!) 111/53 Pain:  Pt with no c/o pain   Therapy/Group: Individual Therapy  Elizabeth Tapia 10/09/2019, 11:00 AM

## 2019-10-10 ENCOUNTER — Inpatient Hospital Stay (HOSPITAL_COMMUNITY): Payer: Medicare Other | Admitting: Speech Pathology

## 2019-10-10 ENCOUNTER — Inpatient Hospital Stay (HOSPITAL_COMMUNITY): Payer: Medicare Other | Admitting: Occupational Therapy

## 2019-10-10 ENCOUNTER — Inpatient Hospital Stay (HOSPITAL_COMMUNITY): Payer: Medicare Other

## 2019-10-10 DIAGNOSIS — R0989 Other specified symptoms and signs involving the circulatory and respiratory systems: Secondary | ICD-10-CM

## 2019-10-10 DIAGNOSIS — E43 Unspecified severe protein-calorie malnutrition: Secondary | ICD-10-CM | POA: Insufficient documentation

## 2019-10-10 DIAGNOSIS — I69391 Dysphagia following cerebral infarction: Secondary | ICD-10-CM

## 2019-10-10 DIAGNOSIS — N1831 Chronic kidney disease, stage 3a: Secondary | ICD-10-CM

## 2019-10-10 MED ORDER — ENSURE ENLIVE PO LIQD
237.0000 mL | Freq: Two times a day (BID) | ORAL | Status: DC
  Administered 2019-10-10 – 2019-10-12 (×5): 237 mL via ORAL

## 2019-10-10 NOTE — Progress Notes (Signed)
Speech Language Pathology Daily Session Note  Patient Details  Name: Elizabeth Tapia MRN: [id number] Date of Birth: 02-20-1929  Today's Date: 10/10/2019 SLP Individual Time: 1400-1500 SLP Individual Time Calculation (min): 60 min  Short Term Goals: Week 1: SLP Short Term Goal 1 (Week 1): Pt will demonstrate sustained attention in 30 minute intervals with min A verbal cues during mildly complex tasks. SLP Short Term Goal 2 (Week 1): Pt will demonstrate basic to mildly complex problem solving skills with min A verbal cues. SLP Short Term Goal 3 (Week 1): Pt will demonstrate self-monitoring and self-correcting of functional errors in problem solving tasks with min A verbal cues. SLP Short Term Goal 4 (Week 1): Pt will demonstrate recall of daily information (including speech and swallow strategies) with min A verbal cues for visual aids. SLP Short Term Goal 5 (Week 1): Pt will utilize swallow strategies when consuming dys 3 textures and thin liquids via cup with minimal overt s/s aspiration and min A verbal cues. SLP Short Term Goal 6 (Week 1): Pt will increase vocal intensity to 85% intelligibility at the sentence level and clear wet vocal quality with min A verbal cues.  Skilled Therapeutic Interventions:   Patient seen for skilled ST session focusing on cognitive-linguistic goals. She was oriented to approximate date and time of day and stated she was here because she "had a stroke". She had difficulty identifying and describing physical impairments initially, but with min-modA cues, she was able to demonstrate emergent awareness. She would frequently state that she "used to do..." when asked about interests, hobbies, etc. She informed SLP that over the past several years she has become less active and feeling "useless". Patient's vocal intensity was adequate throughout session and speech intelligibility in quiet environment was approximately 85-90% when SLP sitting close to her. She was able to  provide biographical information without difficulty but did require prompting and cues to elaborate and maintain attention during discussion and tasks. Patient continues to benefit from skilled SLP intervention to maximize cognitive-linguistic, speech and swallow function prior to discharge.  Pain Pain Assessment Pain Scale: 0-10 Pain Score: 0-No pain  Therapy/Group: Individual Therapy  Angela Nevin, MA, CCC-SLP Speech Therap

## 2019-10-10 NOTE — Plan of Care (Signed)
  Problem: RH BOWEL ELIMINATION Goal: RH STG MANAGE BOWEL W/MEDICATION W/ASSISTANCE Description: STG Manage Bowel with Medication with Mod I Assistance. Outcome: Not Progressing; incontinence   Problem: RH BLADDER ELIMINATION Goal: RH STG MANAGE BLADDER WITH ASSISTANCE Description: STG Manage Bladder With Mod I Assistance Outcome: Not Progressing;incontinence

## 2019-10-10 NOTE — Progress Notes (Signed)
Calorie Count Note: Day 1  48-hour calorie count ordered. Calorie count started at 1200 on 10/09/19.  Diet: Dysphagia 3 with thin liquids Supplements: - ProSource Plus BID - Ensure Enlive BID  Day 1: 10/06 Lunch: 237 kcal, 8 grams of protein 10/06 Dinner: 204 kcal, 10 grams of protein 10/07 Breakfast: 233 kcal, 7 grams of protein Supplements: 200 kcal, 30 grams of protein (2 ProSource Plus supplements per MAR)  Total 24-hour intake: 874 kcal (65% of minimum estimated needs)  55 grams of protein (85% of minimum estimated needs)  Nutrition Diagnosis: Severe Malnutrition related to chronic illness (CKD) as evidenced by severe muscle depletion, severe fat depletion.  Goal: Patient will meet greater than or equal to 90% of their needs  Intervention: - Continue calorie count - Ensure Enlive BID - ProSource Plus BID   Earma Reading, MS, RD, LDN Inpatient Clinical Dietitian Please see AMiON for contact information.

## 2019-10-10 NOTE — Progress Notes (Signed)
Initial Nutrition Assessment  DOCUMENTATION CODES:   Underweight, Severe malnutrition in context of chronic illness  INTERVENTION:   - Ensure Enlive po BID, each supplement provides 350 kcal and 20 grams of protein  - ProSource Plus po BID, each supplement provides 100 kcal and 15 grams of protein  - Continue 48-hour calorie count, RD to provide Day 2 results tomorrow, 10/08  NUTRITION DIAGNOSIS:   Severe Malnutrition related to chronic illness (CKD) as evidenced by severe muscle depletion, severe fat depletion.  GOAL:   Patient will meet greater than or equal to 90% of their needs  MONITOR:   PO intake, Supplement acceptance, Labs, Weight trends, Skin  REASON FOR ASSESSMENT:   Consult Assessment of nutrition requirement/status, Calorie Count  ASSESSMENT:   86 year old female with PMH of HTN, CKD, anxiety, depression, tremors. 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 On 10/01, pt underwent left carotid endarterectomy. Pt admitted to CIR on 10/04.   Spoke with pt at bedside. Pt reports that she does not have much of an appetite but that this is her baseline. PTA, pt was consuming 3 small meals daily along with 1 Ensure supplement daily. Pt amenable to receiving Ensure Enlive during admission.  Pt reports that she has had some weight loss, about 5-6 lbs. She reports her UBW as 100 lbs but is unsure when she last weighed this. No weight history available in chart.  Discussed with pt the importance of adequate PO intake in healing and maintaining lean muscle mass. Pt expresses understanding.  Please see Calorie Count: Day 1 note for details regarding the first 24 hours of calorie count.  Meal Completion: 0-40% x last 8 documented meals  Medications reviewed and include: ProSource Plus BID, protonix  Labs reviewed: sodium 134, hemoglobin 9.6  NUTRITION - FOCUSED  PHYSICAL EXAM:    Most Recent Value  Orbital Region Severe depletion  Upper Arm Region Moderate depletion  Thoracic and Lumbar Region Moderate depletion  Buccal Region Severe depletion  Temple Region Severe depletion  Clavicle Bone Region Severe depletion  Clavicle and Acromion Bone Region Severe depletion  Scapular Bone Region Moderate depletion  Dorsal Hand Moderate depletion  Patellar Region Severe depletion  Anterior Thigh Region Severe depletion  Posterior Calf Region Moderate depletion  Edema (RD Assessment) None  Hair Reviewed  Eyes Reviewed  Mouth Reviewed  Skin Reviewed       Diet Order:   Diet Order            DIET DYS 3 Room service appropriate? Yes with Assist; Fluid consistency: Thin  Diet effective now                 EDUCATION NEEDS:   Education needs have been addressed  Skin:  Skin Assessment: Skin Integrity Issues: Incisions: neck Other: open blister to upper back  Last BM:  10/08/19 type 6  Height:   Ht Readings from Last 1 Encounters:  10/07/19 5\' 2"  (1.575 m)    Weight:   Wt Readings from Last 1 Encounters:  10/07/19 41.6 kg    Ideal Body Weight:  50 kg  BMI:  Body mass index is 16.77 kg/m.  Estimated Nutritional Needs:   Kcal:  1350-1550  Protein:  65-80 grams  Fluid:  1.4-1.6 L    12/07/19, MS, RD, LDN Inpatient Clinical Dietitian Please see AMiON for contact information.

## 2019-10-10 NOTE — Progress Notes (Signed)
Occupational Therapy Session Note  Patient Details  Name: Elizabeth Tapia MRN: [id number] Date of Birth: February 25, 1929  Today's Date: 10/10/2019 OT Individual Time: 6384-6659 OT Individual Time Calculation (min): 58 min    Short Term Goals: See Week 1 Goals for details   Skilled Therapeutic Interventions/Progress Updates:    Pt agreeable to therapy sitting up in the wheelchair with her son present.  She was taken down to the therapy gym in wheelchair and transferred squat pivot from the chair to the therapy mat with max assist.  Had her work on upright sitting balance with anterior pelvic tilt to start session.  Pt with significant posterior pelvic tilt with increased thoracic and lumbar flexion.  Therapist provided mobilizations in sitting to help with upright posture and anterior pelvic tilt.  Had her place BUEs on bedside table for weightbearing with pt told to focus on pushing down through her forearms to help increase upright posture.  Next, had pt transition to supine with mod assist for work on activation of trunk, LUE, and head to assist with rolling side.  She needed max assist for initiation of shoulder movements, and head movement for rolling to the left.  Repeated process broken down in steps with just head and arm and then both together with max facilitation.  She was not able to lift her head off of the pillow for initiation of abdominal activation without mod assist.  She was able to roll to the right side with mod to max assist as well for head initiation.  In supine, also worked on activation of the right shoulder flexors and elbow extensors with therapist supporting the arm.  She was able to exhibit Brunnstrum stage II-III movement with increased tone noted in the internal rotators, elbow flexors, and digit flexors.  Transitioned back to sitting with mod assist and then completed squat pivot transfer back to the wheelchair at max assist.  Returned pt to the room with encouragement to  remain sitting up with the call button and phone in reach and safety belt in place.  Pt's son in room as well.   Therapy Documentation Precautions:  Precautions Precautions: Fall Precaution Comments: R hemiparesis Restrictions Weight Bearing Restrictions: No   Pain: Pain Assessment Pain Scale: Faces Pain Score: 0-No pain ADL: See Care Tool Section for some details of mobility and selfcare  Therapy/Group: Individual Therapy  [name] OTR/L 10/10/2019, 12:37 PM

## 2019-10-10 NOTE — Progress Notes (Addendum)
[facility] PHYSICAL MEDICINE & REHABILITATION PROGRESS NOTE  Subjective/Complaints: Patient seen sitting up in bed this morning.  She states she slept well overnight.  She states she is doing well.  ROS: Denies CP, SOB, N/V/D  Objective: Vital Signs: Blood pressure 138/64, pulse 77, temperature 97.7 F (36.5 C), resp. rate 18, height 5\' 2"  (1.575 m), weight 41.6 kg, SpO2 100 %. No results found. Recent Labs    10/08/19 0601  WBC 9.4  HGB 9.6*  HCT 30.4*  PLT 453*   Recent Labs    10/08/19 0601  NA 134*  K 3.8  CL 101  CO2 24  GLUCOSE 107*  BUN 17  CREATININE 1.12*  CALCIUM 8.6*    Intake/Output Summary (Last 24 hours) at 10/10/2019 1142 Last data filed at 10/10/2019 0829 Gross per 24 hour  Intake 397 ml  Output --  Net 397 ml        Physical Exam: BP 138/64 (BP Location: Right Arm)   Pulse 77   Temp 97.7 F (36.5 C)   Resp 18   Ht 5\' 2"  (1.575 m)   Wt 41.6 kg   SpO2 100%   BMI 16.77 kg/m  Constitutional: No distress . Vital signs reviewed. HENT: Normocephalic.  Atraumatic. Eyes: EOMI. No discharge. Cardiovascular: No JVD.  RRR. Respiratory: Normal effort.  No stridor.  Bilateral clear to auscultation. GI: Non-distended.  BS +. Skin: Neck incision healing Psych: Normal mood.  Normal behavior. Musc: No edema in extremities.  No tenderness in extremities. Psych: Normal mood.  Somewhat slowed. Musc: No edema in extremities.  No tenderness in extremities. Neuro: Alert and oriented x3 Mild dysarthria. Motor: RUE: Shoulder abduction 2/5, elbow flexion/extension 2/5, handgrip 2-/5 with increase in tone Right lower extremity: Hip flexion, knee extension 4-/5, ankle dorsiflexion 4+/5  Assessment/Plan: 1. Functional deficits secondary to left frontoparietal infarct which require 3+ hours per day of interdisciplinary therapy in a comprehensive inpatient rehab setting.  Physiatrist is providing close team supervision and 24 hour management of active medical  problems listed below.  Physiatrist and rehab team continue to assess barriers to discharge/monitor patient progress toward functional and medical goals   Care Tool:  Bathing  Bathing activity did not occur: Refused Body parts bathed by patient: Chest, Abdomen, Front perineal area, Right upper leg, Left upper leg, Face   Body parts bathed by helper: Right arm, Left arm, Buttocks, Right lower leg, Left lower leg     Bathing assist Assist Level: Maximal Assistance - Patient 24 - 49%     Upper Body Dressing/Undressing Upper body dressing   What is the patient wearing?: Pull over shirt    Upper body assist Assist Level: Maximal Assistance - Patient 25 - 49%    Lower Body Dressing/Undressing Lower body dressing      What is the patient wearing?: Pants, Incontinence brief     Lower body assist Assist for lower body dressing: Total Assistance - Patient < 25%     Toileting Toileting    Toileting assist Assist for toileting: Total Assistance - Patient < 25%     Transfers Chair/bed transfer  Transfers assist  Chair/bed transfer activity did not occur: Safety/medical concerns  Chair/bed transfer assist level: Maximal Assistance - Patient 25 - 49%     Locomotion Ambulation   Ambulation assist   Ambulation activity did not occur: Safety/medical concerns (R hemi, fatigue,generalized weakness, decresed balance/postural control)          Walk 10 feet activity  Assist  Walk 10 feet activity did not occur: Safety/medical concerns (R hemi, fatigue,generalized weakness, decresed balance/postural control)        Walk 50 feet activity   Assist Walk 50 feet with 2 turns activity did not occur: Safety/medical concerns (R hemi, fatigue,generalized weakness, decresed balance/postural control)         Walk 150 feet activity   Assist Walk 150 feet activity did not occur: Safety/medical concerns (R hemi, fatigue,generalized weakness, decresed balance/postural  control)         Walk 10 feet on uneven surface  activity   Assist Walk 10 feet on uneven surfaces activity did not occur: Safety/medical concerns (R hemi, fatigue,generalized weakness, decresed balance/postural control)         Wheelchair     Assist Will patient use wheelchair at discharge?: Yes Type of Wheelchair: Manual    Wheelchair assist level: Moderate Assistance - Patient 50 - 74% Max wheelchair distance: 39ft    Wheelchair 50 feet with 2 turns activity    Assist        Assist Level: Total Assistance - Patient < 25%   Wheelchair 150 feet activity     Assist      Assist Level: Dependent - Patient 0%    Medical Problem List and Plan: 1.  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 Pt s/p left CEA.  Continue CIR 2.  Antithrombotics: -DVT/anticoagulation:  Pharmaceutical:  Continue heparin             -antiplatelet therapy: on ASA 325/Plavix 75 mg daily.  3. Pain Management:   As needed medications 4. Mood: LCSW to follow for evaluation and support.              -antipsychotic agents: N/A 5. Neuropsych: This patient is capable of making decisions on her own behalf. 6. Skin/Wound Care: Monitor incision for healing. Routine pressure relief measures.  7. Fluids/Electrolytes/Nutrition: Monitor I/Os.  8. HTN:   Inderal 60 mg bid  Monitor with increased mobility  Labile on 10/7, monitor trend.  9. CKD: Encourage fluid intake.   Creatinine 1.12 on 10/5, labs ordered for tomorrow  Continue to monitor 10. ABLA:   Hemoglobin 9.6 on 10/5, labs ordered for tomorrow  Continue to monitor 11. Anxiety/depression:  On valium bid to help manage anxiety state. Well controlled with medication.   Fluoxetine DC'd due to Plavix 12. IBS--diarrhea: Questran on hold 13.  Hyponatremia  Sodium 134 on 10/5, labs ordered for tomorrow  Continue to monitor 14.  Severe  hypoalbuminemia  Supplement initiated on 10/5 15. BMI 16.77- dietary consult for calorie count, assess nutritional needs.  16.  Post stroke dysphagia  D3 thins  Advance diet as tolerated  LOS: 3 days A FACE TO FACE EVALUATION WAS PERFORMED  Oluwaseyi Tull Karis Juba 10/10/2019, 11:42 AM

## 2019-10-10 NOTE — Progress Notes (Signed)
Physical Therapy Session Note  Patient Details  Name: Elizabeth Tapia MRN: [id number] Date of Birth: [birth year]  Today's Date: 10/10/2019 PT Individual Time: 4098-1191 PT Individual Time Calculation (min): 72 min   Short Term Goals: Week 1:  PT Short Term Goal 1 (Week 1): Pt will perform bed mobility with supervision PT Short Term Goal 2 (Week 1): Pt wil transfer sit<>stand with LRAD min A PT Short Term Goal 3 (Week 1): Pt will perform simulated car transfer with LRAD mod A  Skilled Therapeutic Interventions/Progress Updates:   Received pt sitting in bed, pt agreeable to therapy, and denied any pain during session. Session with emphasis on functional mobility/transfers, dressing, toileting, generalized strengthening, dynamic standing balance/coordination, NMR, and improved activity tolerance. Donned bilateral ted hose and non-skid socks total A while in supine. Donned pants supine in bed with max A and pt rolled L and R with min A and use of bedrails and required total A to pull pants over hips. Pt reported urge to use restroom and transferred supine<>sitting EOB from flat bed with use of bedrails, supervision, and increased time. Pt attempted to transfer sit<>stand from bed with RW and max A x 3 attempts but unable to come into upright position despite max cues for anterior weight shifting and upright posture as pt pushing posteriorly. Pt transferred bed<>WC and WC bedside commode with max/total A without AD. Pt required total A to doff dirty brief and pants. Pt able to void and with medium sized BM (RN made aware). Attempted to stand from bedside commode with RW to perfom peri-care but pt unable to stand. Instead transferred bedside commode<>WC stand<>pivot max A without AD and transferred sit<>stand at sink with mod A and required total A to pull brief/pants over hips. Pt's son present to bring pt's clothing from home. Therapist assisted with combing and fixing pt's hair with total A. Pt transported  to therapy gym in [facility] total A for time management purposes. Pt transferred sit<>stand at elevated mat table with bench placed on top to support R UE and promote weight bearing with max A x 3 trials and worked on standing balance, upright posture, and anterior weight shifting: Trial 1: 2 minutes and pt able to perform alternating marching x 8 reps with mod A for balance Trial 2: 2 minutes and 50 seconds and 2x8 reps alternating marches Trial 3: 2 minutes and 30 seconds and x4 reps hip abduction bilaterally.  Pt required verbal cues for anterior weight shifting, upright posture and gaze, hand placement, and total A to manage R UE. Noted pt avoiding R heel strike and required max cues for correction. Worked on anterior weight shifting, trunk rotation and extension, and reaching outside BOS crossing midline to grab cups with L UE and stack them on the R side of the mat x 4 trials with cues for trunk extension and to avoid excessive cervical flexion. Pt transported back to room in [facility] total A. Concluded session with pt sitting in WC, needs within reach, and seatbelt alarm on. Therapist educated pt's son on donning R wrist splint and R UE elevated on pillow.   Therapy Documentation Precautions:  Precautions Precautions: Fall Precaution Comments: R hemiparesis Restrictions Weight Bearing Restrictions: No  Therapy/Group: Individual Therapy Martin Majestic PT, DPT   10/10/2019, 7:24 AM

## 2019-10-11 ENCOUNTER — Inpatient Hospital Stay (HOSPITAL_COMMUNITY): Payer: Medicare Other | Admitting: Occupational Therapy

## 2019-10-11 ENCOUNTER — Inpatient Hospital Stay (HOSPITAL_COMMUNITY): Payer: Medicare Other | Admitting: Speech Pathology

## 2019-10-11 ENCOUNTER — Inpatient Hospital Stay (HOSPITAL_COMMUNITY): Payer: Medicare Other

## 2019-10-11 LAB — CBC
HCT: 28.3 % — ABNORMAL LOW (ref 36.0–46.0)
Hemoglobin: 8.8 g/dL — ABNORMAL LOW (ref 12.0–15.0)
MCH: 28.8 pg (ref 26.0–34.0)
MCHC: 31.1 g/dL (ref 30.0–36.0)
MCV: 92.5 fL (ref 80.0–100.0)
Platelets: 501 10*3/uL — ABNORMAL HIGH (ref 150–400)
RBC: 3.06 MIL/uL — ABNORMAL LOW (ref 3.87–5.11)
RDW: 13.2 % (ref 11.5–15.5)
WBC: 8.1 10*3/uL (ref 4.0–10.5)
nRBC: 0 % (ref 0.0–0.2)

## 2019-10-11 LAB — BASIC METABOLIC PANEL
Anion gap: 9 (ref 5–15)
BUN: 27 mg/dL — ABNORMAL HIGH (ref 8–23)
CO2: 26 mmol/L (ref 22–32)
Calcium: 9 mg/dL (ref 8.9–10.3)
Chloride: 101 mmol/L (ref 98–111)
Creatinine, Ser: 1.16 mg/dL — ABNORMAL HIGH (ref 0.44–1.00)
GFR calc non Af Amer: 42 mL/min — ABNORMAL LOW (ref >60)
Glucose, Bld: 105 mg/dL — ABNORMAL HIGH (ref 70–99)
Potassium: 3.7 mmol/L (ref 3.5–5.1)
Sodium: 136 mmol/L (ref 135–145)

## 2019-10-11 NOTE — Progress Notes (Signed)
Occupational Therapy Session Note  Patient Details  Name: Elizabeth Tapia MRN: [id number] Date of Birth: 1929-03-16  Today's Date: 10/11/2019 OT Individual Time: 1103-1200 OT Individual Time Calculation (min): 57 min    Short Term Goals: Week 1:  OT Short Term Goal 1 (Week 1): Pt will complete LB dressing with max assist at sit > stand level OT Short Term Goal 2 (Week 1): Pt will complete toilet transfers max assist OT Short Term Goal 3 (Week 1): Pt will complete bathing with mod assist at sit > stand level OT Short Term Goal 4 (Week 1): Pt will utilize RUE as gross assist during self-care tasks  Skilled Therapeutic Interventions/Progress Updates:    Treatment session with focus on postural control and LUE NMR.  Pt received upright in w/c already dressed, agreeable to therapy session.  Completed squat pivot transfer to therapy mat with max assist and facilitation of anterior weight shift.  Utilized Ship broker for visual feedback for postural control due to extreme posterior pelvic tilt and increased thoracic and lumbar flexion.  Therapist providing facilitation along back to facilitate upright posture while engaging pt in anterior weight shift with functional reaching of LUE to further facilitate weight shift.  Engaged in towel glides with RUE, therapist providing support at elbow to decrease gravity and allow pt to shoulder flexion/extension and abduction/adduction.  Engaged in sit > stand initially with sheet around hips to facilitate anterior pelvic tilt and facilitate weight shift. Transitioned to therapist providing facilitation at hips and torso due to continued thoracic flexion in standing.  Utilized Ship broker for additional feedback for upright posture.  Utilized table in front of pt during standing to further increase upright posture, incorporating functional reach to table with improved upright standing.  Returned to w/c squat pivot max assist.  Pt remained upright in w/c in room with seat belt  alarm on and all needs in reach.  Therapy Documentation Precautions:  Precautions Precautions: Fall Precaution Comments: R hemiparesis Restrictions Weight Bearing Restrictions: No Pain: Pain Assessment Pain Scale: 0-10 Pain Score: 0-No pain   Therapy/Group: Individual Therapy  Rosalio Loud 10/11/2019, 12:29 PM

## 2019-10-11 NOTE — Progress Notes (Signed)
Calorie Count Note: Day 2  48-hour calorie count ordered. Calorie count started at 1200 on 10/09/19 and ended today.  Diet: Dysphagia 3 with thin liquids Supplements: - ProSource Plus BID - Ensure Enlive BID  Day 2: 10/07 Lunch: 301 kcal, 9 grams of protein 10/07 Dinner: 167 kcal, 5 grams of protein 10/08 Breakfast: 163 kcal, 10 grams of protein Supplements: 288 kcal, 35 grams of protein  Total 24-hour intake: 919 kcal (68% of minimum estimated needs)  59 grams of protein (91% of minimum estimated needs)  Nutrition Diagnosis: Severe Malnutritionrelated to chronic illness (CKD)as evidenced by severe muscle depletion, severe fat depletion.  Goal: Patient will meet greater than or equal to 90% of their needs  Intervention: - d/c calorie count - Continue Ensure Enlive BID - Continue ProSource Plus BID - RD will continue to follow pt during admission   Earma Reading, MS, RD, LDN Inpatient Clinical Dietitian Please see [facility] for contact information.

## 2019-10-11 NOTE — Progress Notes (Signed)
Speech Language Pathology Daily Session Note  Patient Details  Name: Elizabeth Tapia MRN: [id number] Date of Birth: March 29, 1929  Today's Date: 10/11/2019 SLP Individual Time: 1330-1430 SLP Individual Time Calculation (min): 60 min  Short Term Goals: Week 1: SLP Short Term Goal 1 (Week 1): Pt will demonstrate sustained attention in 30 minute intervals with min A verbal cues during mildly complex tasks. SLP Short Term Goal 2 (Week 1): Pt will demonstrate basic to mildly complex problem solving skills with min A verbal cues. SLP Short Term Goal 3 (Week 1): Pt will demonstrate self-monitoring and self-correcting of functional errors in problem solving tasks with min A verbal cues. SLP Short Term Goal 4 (Week 1): Pt will demonstrate recall of daily information (including speech and swallow strategies) with min A verbal cues for visual aids. SLP Short Term Goal 5 (Week 1): Pt will utilize swallow strategies when consuming dys 3 textures and thin liquids via cup with minimal overt s/s aspiration and min A verbal cues. SLP Short Term Goal 6 (Week 1): Pt will increase vocal intensity to 85% intelligibility at the sentence level and clear wet vocal quality with min A verbal cues.  Skilled Therapeutic Interventions:   Patient seen for skilled ST session focusing on swallow and speech-language, cognitive goals. Patient was finishing lunch and SLP observed her to cough several times after consuming thin liquids. Patient did report history of some swallowing difficulty when SLP was reviewing recent MBS films. Patient unable to consistently and effectively perform swallow strategy to 'swallow hard'. No significant change in vocal quality during or after coughing episodes. Patient participated in functional tasks of reading directions and medication labels as well as check writing task. She indicated that she would not be able to write effectively, as she is unable to use right hand. She did attempt to write her name  with left hand when SLP requested she try, but at this point it was not very functional for her. She was able to locate information on medication labels when requested but was 65% accurate for answering multiple choice (3-choice) questions after reading short 2-3 sentence paragraphs. Patient continues to benefit from skilled SLP intervention to maximize swallow, speech-language and cognitive functioning prior to discharge.  Pain Pain Assessment Pain Scale: 0-10 Pain Score: 0-No pain  Therapy/Group: Individual Therapy  Angela Nevin, MA, CCC-SLP Speech Therapy

## 2019-10-11 NOTE — Progress Notes (Signed)
[place] PHYSICAL MEDICINE & REHABILITATION PROGRESS NOTE  Subjective/Complaints: Patient seen sitting up in bed and later transferring with therapy this morning.  Extreme forward flexion noted.  Evaluated back with therapies.  Discussed range of motion and finger contracture with patient and therapies as well.  ROS: Denies CP, SOB, N/V/D  Objective: Vital Signs: Blood pressure (!) 135/55, pulse 74, temperature 98.8 F (37.1 C), resp. rate 16, height 5\' 2"  (1.575 m), weight 41.6 kg, SpO2 97 %. No results found. Recent Labs    10/11/19 0629  WBC 8.1  HGB 8.8*  HCT 28.3*  PLT 501*   Recent Labs    10/11/19 0629  NA 136  K 3.7  CL 101  CO2 26  GLUCOSE 105*  BUN 27*  CREATININE 1.16*  CALCIUM 9.0    Intake/Output Summary (Last 24 hours) at 10/11/2019 1003 Last data filed at 10/11/2019 0859 Gross per 24 hour  Intake 600 ml  Output --  Net 600 ml        Physical Exam: BP (!) 135/55 (BP Location: Left Arm)   Pulse 74   Temp 98.8 F (37.1 C)   Resp 16   Ht 5\' 2"  (1.575 m)   Wt 41.6 kg   SpO2 97%   BMI 16.77 kg/m  Constitutional: No distress . Vital signs reviewed.  Frail. HENT: Normocephalic.  Atraumatic. Eyes: EOMI. No discharge. Cardiovascular: No JVD.  RRR. Respiratory: Normal effort.  No stridor.  Bilateral clear to auscultation. GI: Non-distended.  BS +. Skin: Neck incision healing. No wounds on back Psych: Normal mood.  Slowed. Musc: No edema in extremities.  No tenderness in extremities. Right fourth digit PIP flexion contracture Kyphosis. Neuro: Alert and oriented Mild dysarthria, unchanged. Motor: RUE: Shoulder abduction 2/5, elbow flexion/extension 1/5, handgrip 1/5  Right lower extremity: Hip flexion, knee extension 4-/5, ankle dorsiflexion 4+/5  Assessment/Plan: 1. Functional deficits secondary to left frontoparietal infarct which require 3+ hours per day of interdisciplinary therapy in a comprehensive inpatient rehab  setting.  Physiatrist is providing close team supervision and 24 hour management of active medical problems listed below.  Physiatrist and rehab team continue to assess barriers to discharge/monitor patient progress toward functional and medical goals   Care Tool:  Bathing  Bathing activity did not occur: Refused Body parts bathed by patient: Chest, Abdomen, Front perineal area, Right upper leg, Left upper leg, Face   Body parts bathed by helper: Right arm, Left arm, Buttocks, Right lower leg, Left lower leg     Bathing assist Assist Level: Maximal Assistance - Patient 24 - 49%     Upper Body Dressing/Undressing Upper body dressing   What is the patient wearing?: Pull over shirt    Upper body assist Assist Level: Maximal Assistance - Patient 25 - 49%    Lower Body Dressing/Undressing Lower body dressing      What is the patient wearing?: Pants, Incontinence brief     Lower body assist Assist for lower body dressing: Total Assistance - Patient < 25%     Toileting Toileting    Toileting assist Assist for toileting: Total Assistance - Patient < 25%     Transfers Chair/bed transfer  Transfers assist  Chair/bed transfer activity did not occur: Safety/medical concerns  Chair/bed transfer assist level: Maximal Assistance - Patient 25 - 49%     Locomotion Ambulation   Ambulation assist   Ambulation activity did not occur: Safety/medical concerns (R hemi, fatigue,generalized weakness, decresed balance/postural control)  Walk 10 feet activity   Assist  Walk 10 feet activity did not occur: Safety/medical concerns (R hemi, fatigue,generalized weakness, decresed balance/postural control)        Walk 50 feet activity   Assist Walk 50 feet with 2 turns activity did not occur: Safety/medical concerns (R hemi, fatigue,generalized weakness, decresed balance/postural control)         Walk 150 feet activity   Assist Walk 150 feet activity did not  occur: Safety/medical concerns (R hemi, fatigue,generalized weakness, decresed balance/postural control)         Walk 10 feet on uneven surface  activity   Assist Walk 10 feet on uneven surfaces activity did not occur: Safety/medical concerns (R hemi, fatigue,generalized weakness, decresed balance/postural control)         Wheelchair     Assist Will patient use wheelchair at discharge?: Yes Type of Wheelchair: Manual    Wheelchair assist level: Moderate Assistance - Patient 50 - 74% Max wheelchair distance: 39ft    Wheelchair 50 feet with 2 turns activity    Assist        Assist Level: Total Assistance - Patient < 25%   Wheelchair 150 feet activity     Assist      Assist Level: Dependent - Patient 0%    Medical Problem List and Plan: 1.  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 Pt s/p left CEA.  Continue CIR 2.  Antithrombotics: -DVT/anticoagulation:  Pharmaceutical:  Continue heparin             -antiplatelet therapy: on ASA 325/Plavix 75 mg daily.  3. Pain Management:   As needed medications 4. Mood: LCSW to follow for evaluation and support.              -antipsychotic agents: N/A 5. Neuropsych: This patient is capable of making decisions on her own behalf. 6. Skin/Wound Care: Monitor incision for healing. Routine pressure relief measures.  7. Fluids/Electrolytes/Nutrition: Monitor I/Os.  8. HTN:   Inderal 60 mg bid  Monitor with increased mobility  Controlled on 10/8 9. CKD IIIa/b: Encourage fluid intake.   Creatinine 1.16 on 10/8  Continue to monitor 10. ABLA:   Hemoglobin 8.8 on 10/8, trending down, labs ordered for Monday  Hemoccult ordered  Continue to monitor 11. Anxiety/depression:  On valium bid to help manage anxiety state. Well controlled with medication.   Fluoxetine DC'd due to Plavix 12. IBS--diarrhea: Questran on hold 13.   Hyponatremia  Sodium 136 on 10/8  Continue to monitor 14.  Severe hypoalbuminemia  Supplement initiated on 10/5 15. BMI 16.77- dietary consult for calorie count, assess nutritional needs.  16.  Post stroke dysphagia  D3 thins  Advance diet as tolerated  LOS: 4 days A FACE TO FACE EVALUATION WAS PERFORMED  Karilyn Wind Karis Juba 10/11/2019, 10:03 AM

## 2019-10-11 NOTE — Progress Notes (Signed)
Physical Therapy Session Note  Patient Details  Name: Elizabeth Tapia MRN: [id number] Date of Birth: 03-Jul-1929  Today's Date: 10/11/2019 PT Individual Time: 0900-0953 PT Individual Time Calculation (min): 53 min   Short Term Goals: Week 1:  PT Short Term Goal 1 (Week 1): Pt will perform bed mobility with supervision PT Short Term Goal 2 (Week 1): Pt wil transfer sit<>stand with LRAD min A PT Short Term Goal 3 (Week 1): Pt will perform simulated car transfer with LRAD mod A  Skilled Therapeutic Interventions/Progress Updates:   Received pt long sitting in bed, pt agreeable to therapy, and denied any pain during session but reported feeling weaker today but unsure as to why. Session with emphasis on functional mobility/transfers, dressing, dynamic standing balance/coordination, simulated car transfers, and improved activity tolerance. Donned bilateral ted hose and non-skid socks total A. Pt unaware that brief was soiled. Doffed dirty brief and donned clean brief and pants in supine with max/total A and pt transferred supine<>sitting EOB from flat bed with min A, use of bedrails, cues for logroll technique, and increased time. Pt transferred bed<>WC stand<>pivot without AD and max A with cues for upright posture/gaze, anterior weight shifting, hand placement, and transfer technique. MD present for morning rounds and to inspect back/buttocks. Pt transferred sit<>stand at sink with mod A and cues for upright posture and anterior weight shifting and total A for R UE management while MD inspected back and buttocks. Pt combed hair and brushed teeth sitting in WC at sink with supervision and increased time. Doffed dirty shirt and donned clean pull over shirt with max A. Pt transported to therapy gym in [facility] total A for time management purposes and performed simulated car transfer without AD and max A. Pt with improved posture when holding onto therapist's shoulder with L UE during transfer but still required max  cues for anterior weight shifting and foot placement. Pt transported back to room in [facility] total A. Concluded session with pt sitting in WC, needs within reach, and seatbelt alarm on. Donned R UE hand/wrist splint with total A.   Therapy Documentation Precautions:  Precautions Precautions: Fall Precaution Comments: R hemiparesis Restrictions Weight Bearing Restrictions: No  Therapy/Group: Individual Therapy Martin Majestic PT, DPT   10/11/2019, 7:17 AM

## 2019-10-12 ENCOUNTER — Inpatient Hospital Stay (HOSPITAL_COMMUNITY): Payer: Medicare Other | Admitting: Occupational Therapy

## 2019-10-12 ENCOUNTER — Inpatient Hospital Stay (HOSPITAL_COMMUNITY): Payer: Medicare Other | Admitting: Physical Therapy

## 2019-10-12 ENCOUNTER — Inpatient Hospital Stay (HOSPITAL_COMMUNITY): Payer: Medicare Other | Admitting: Speech Pathology

## 2019-10-12 NOTE — Progress Notes (Signed)
Speech Language Pathology Daily Session Note  Patient Details  Name: Elizabeth Tapia MRN: [id number] Date of Birth: Sep 28, 1929  Today's Date: 10/12/2019 SLP Individual Time: 0930-1015 SLP Individual Time Calculation (min): 45 min  Short Term Goals: Week 1: SLP Short Term Goal 1 (Week 1): Pt will demonstrate sustained attention in 30 minute intervals with min A verbal cues during mildly complex tasks. SLP Short Term Goal 2 (Week 1): Pt will demonstrate basic to mildly complex problem solving skills with min A verbal cues. SLP Short Term Goal 3 (Week 1): Pt will demonstrate self-monitoring and self-correcting of functional errors in problem solving tasks with min A verbal cues. SLP Short Term Goal 4 (Week 1): Pt will demonstrate recall of daily information (including speech and swallow strategies) with min A verbal cues for visual aids. SLP Short Term Goal 5 (Week 1): Pt will utilize swallow strategies when consuming dys 3 textures and thin liquids via cup with minimal overt s/s aspiration and min A verbal cues. SLP Short Term Goal 6 (Week 1): Pt will increase vocal intensity to 85% intelligibility at the sentence level and clear wet vocal quality with min A verbal cues.  Skilled Therapeutic Interventions:   Patient seen for skilled ST session focusing on dysphagia and cognitive-linguistic goals. Patient was in bed when SLP arrived and was fairly drowsy, requiring frequent cues for alertness. SLP observed her with meal and discussed her ability to self-feed with the NT's that have worked with her. They both reported that she needs only setup assistance and then she is able to manage self-feeding without difficulty. SLP to change her from full to intermittent supervision. Patient does have intermittent cough and when doing so she said, "Ive done this for years". From her MBS report, it does appear that her dysphagia is likely primarily chronic. Patient was much slower in responses today and after  discussion and review of her current medications, SLP started to present pill box task as patient said she did do that at home (about 4-5 medications daily per her report.) Patient was not able to participate in pill box task as she was becoming increasingly more fatigued and started falling asleep and snoring. Plan to work on this next visit. Patient continues to benefit from skilled SLP intervention to maximize swallow, speech and language functioning prior to discharge.  Pain Pain Assessment Pain Scale: 0-10 Pain Score: 0-No pain  Therapy/Group: Individual Therapy  Angela Nevin, MA, CCC-SLP Speech Therapy

## 2019-10-12 NOTE — Progress Notes (Signed)
Occupational Therapy Session Note  Patient Details  Name: Elizabeth Tapia MRN: [id number] Date of Birth: [birth year]  Today's Date: 10/12/2019 OT Individual Time: 0715-0800 OT Individual Time Calculation (min): 45 min    Short Term Goals: Week 1:  OT Short Term Goal 1 (Week 1): Pt will complete LB dressing with max assist at sit > stand level OT Short Term Goal 2 (Week 1): Pt will complete toilet transfers max assist OT Short Term Goal 3 (Week 1): Pt will complete bathing with mod assist at sit > stand level OT Short Term Goal 4 (Week 1): Pt will utilize RUE as gross assist during self-care tasks  Skilled Therapeutic Interventions/Progress Updates:  Patient met lying supine in bed in agreement with OT treatment session. 0/10 pain at rest and with activity. Supine to EOB with patient able to advance BLE toward EOB. Assist required to bring trunk upright to sitting position. Patient initially c/o dizziness upon sitting that resolved. Patient required assist to doff button up shirt and to don clean shirt with cueing for hemi technique. Patient required assist to thread BLE through pants and weight shift R<>L to hike over hips in sitting. Max A for squat-pivot transfer to wc on L. Attempted squat-pivot transfer to drop-arm BSC in bathroom but patient unable. Sit to stand in [place] for toilet transfer. Max A for hygiene/clothing management as patient was incontinent of bladder. Hand hygiene in perched position on [place] at sink level. Session concluded with patient seated in wc with call bell within reach, belt alarm activated, and all needs met.   Therapy Documentation Precautions:  Precautions Precautions: Fall Precaution Comments: R hemiparesis Restrictions Weight Bearing Restrictions: No General:    Therapy/Group: Individual Therapy  Elizabeth Tapia R [place] 10/12/2019, 7:59 AM

## 2019-10-12 NOTE — Progress Notes (Signed)
[facility] PHYSICAL MEDICINE & REHABILITATION PROGRESS NOTE  Subjective/Complaints:  Pt reports a little woozy/dizzy this AM- it was the first OT had heard of it, who was working with her- will check her BP.  LBM 2 days ago- denies constipation is normal for her.     ROS: Pt denies SOB, abd pain, CP, N/V/C/D, and vision changes   Objective: Vital Signs: Blood pressure 138/62, pulse 74, temperature 97.8 F (36.6 C), resp. rate 14, height 5\' 2"  (1.575 m), weight 41.6 kg, SpO2 97 %. No results found. Recent Labs    10/11/19 0629  WBC 8.1  HGB 8.8*  HCT 28.3*  PLT 501*   Recent Labs    10/11/19 0629  NA 136  K 3.7  CL 101  CO2 26  GLUCOSE 105*  BUN 27*  CREATININE 1.16*  CALCIUM 9.0    Intake/Output Summary (Last 24 hours) at 10/12/2019 1021 Last data filed at 10/12/2019 0700 Gross per 24 hour  Intake 660 ml  Output --  Net 660 ml        Physical Exam: BP 138/62 (BP Location: Left Arm)   Pulse 74   Temp 97.8 F (36.6 C)   Resp 14   Ht 5\' 2"  (1.575 m)   Wt 41.6 kg   SpO2 97%   BMI 16.77 kg/m  Constitutional: frail, sitting up EOB with OT; getting dressed, NAD HENT: Normocephalic.  Atraumatic. Eyes: EOMI. No discharge. Cardiovascular: RRR Respiratory: CTA B/L- no W/R/R- good air movement GI: Soft, NT, ND, (+)BS  Skin: Neck incision healing. No wounds on back Psych: Normal mood.  Slowed processing but appropriate Musc: No edema in extremities.  No tenderness in extremities. Right fourth digit PIP flexion contracture Kyphosis. Neuro: Alert and oriented Mild dysarthria, unchanged. Motor: RUE: Shoulder abduction 2/5, elbow flexion/extension 1/5, handgrip 1/5  Right lower extremity: Hip flexion, knee extension 4-/5, ankle dorsiflexion 4+/5  Assessment/Plan: 1. Functional deficits secondary to left frontoparietal infarct which require 3+ hours per day of interdisciplinary therapy in a comprehensive inpatient rehab setting.  Physiatrist is providing  close team supervision and 24 hour management of active medical problems listed below.  Physiatrist and rehab team continue to assess barriers to discharge/monitor patient progress toward functional and medical goals   Care Tool:  Bathing  Bathing activity did not occur: Refused Body parts bathed by patient: Chest, Abdomen, Front perineal area, Right upper leg, Left upper leg, Face   Body parts bathed by helper: Right arm, Left arm, Buttocks, Right lower leg, Left lower leg     Bathing assist Assist Level: Maximal Assistance - Patient 24 - 49%     Upper Body Dressing/Undressing Upper body dressing   What is the patient wearing?: Pull over shirt    Upper body assist Assist Level: Maximal Assistance - Patient 25 - 49%    Lower Body Dressing/Undressing Lower body dressing      What is the patient wearing?: Pants, Incontinence brief     Lower body assist Assist for lower body dressing: Total Assistance - Patient < 25%     Toileting Toileting    Toileting assist Assist for toileting: Total Assistance - Patient < 25%     Transfers Chair/bed transfer  Transfers assist  Chair/bed transfer activity did not occur: Safety/medical concerns  Chair/bed transfer assist level: Maximal Assistance - Patient 25 - 49%     Locomotion Ambulation   Ambulation assist   Ambulation activity did not occur: Safety/medical concerns (R hemi, fatigue,generalized weakness, decresed  balance/postural control)          Walk 10 feet activity   Assist  Walk 10 feet activity did not occur: Safety/medical concerns (R hemi, fatigue,generalized weakness, decresed balance/postural control)        Walk 50 feet activity   Assist Walk 50 feet with 2 turns activity did not occur: Safety/medical concerns (R hemi, fatigue,generalized weakness, decresed balance/postural control)         Walk 150 feet activity   Assist Walk 150 feet activity did not occur: Safety/medical concerns (R  hemi, fatigue,generalized weakness, decresed balance/postural control)         Walk 10 feet on uneven surface  activity   Assist Walk 10 feet on uneven surfaces activity did not occur: Safety/medical concerns (R hemi, fatigue,generalized weakness, decresed balance/postural control)         Wheelchair     Assist Will patient use wheelchair at discharge?: Yes Type of Wheelchair: Manual    Wheelchair assist level: Moderate Assistance - Patient 50 - 74% Max wheelchair distance: 23ft    Wheelchair 50 feet with 2 turns activity    Assist        Assist Level: Total Assistance - Patient < 25%   Wheelchair 150 feet activity     Assist      Assist Level: Dependent - Patient 0%    Medical Problem List and Plan: 1.  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 Pt s/p left CEA.  Continue CIR 2.  Antithrombotics: -DVT/anticoagulation:  Pharmaceutical:  Continue heparin             -antiplatelet therapy: on ASA 325/Plavix 75 mg daily.  3. Pain Management:   As needed medications 4. Mood: LCSW to follow for evaluation and support.              -antipsychotic agents: N/A 5. Neuropsych: This patient is capable of making decisions on her own behalf. 6. Skin/Wound Care: Monitor incision for healing. Routine pressure relief measures.  7. Fluids/Electrolytes/Nutrition: Monitor I/Os.  8. HTN:   Inderal 60 mg bid  Monitor with increased mobility  Controlled on 10/8 9. CKD IIIa/b: Encourage fluid intake.   Creatinine 1.16 on 10/8  10/9- labs weekly/MOnday  Continue to monitor 10. ABLA:   Hemoglobin 8.8 on 10/8, trending down, labs ordered for Monday  Hemoccult ordered  Continue to monitor 11. Anxiety/depression:  On valium bid to help manage anxiety state. Well controlled with medication.   Fluoxetine DC'd due to Plavix 12. IBS--diarrhea: Questran on hold 13.  Hyponatremia  Sodium  136 on 10/8  Continue to monitor 14.  Severe hypoalbuminemia  Supplement initiated on 10/5 15. BMI 16.77- dietary consult for calorie count, assess nutritional needs.  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 Dizzy  10/9- dizzy/woozy this AM- OT/nursing to check orthostatics and let me know if needs intervention 18. Constipation  10/9- LBM 2 days ago- usually has IBS with diarrhea- if no BM by tomorrow, will intervene.   LOS: 5 days A FACE TO FACE EVALUATION WAS PERFORMED  Ramsha Lonigro 10/12/2019, 10:21 AM

## 2019-10-12 NOTE — Progress Notes (Signed)
Physical Therapy Session Note  Patient Details  Name: Elizabeth Tapia MRN: [id number] Date of Birth: 1929-10-25  Today's Date: 10/12/2019 PT Individual Time: 1100-1200 PT Individual Time Calculation (min): 60 min   Short Term Goals: Week 1:  PT Short Term Goal 1 (Week 1): Pt will perform bed mobility with supervision PT Short Term Goal 2 (Week 1): Pt wil transfer sit<>stand with LRAD min A PT Short Term Goal 3 (Week 1): Pt will perform simulated car transfer with LRAD mod A  Skilled Therapeutic Interventions/Progress Updates:   Pt received supine in bed and agreeable to PT. Supine>sit transfer with min assist, increased time and cues for use of RUE as able to push in to sitting.   Stedy transfer to [facility] with mod assist from PT for safety. Seated therex. LAQ, hip flexion, knee extension, ankle DF, hip abduction. Each completed x 10 BLE with cues for sustained hold at end range. PT performed heel cord stretch to the R ankle 2 x 1 min and stretch relax x 6 with 10 sec hold.  Sit<>stand with max assist and UE to push from [facility] arm rest, pt unable to let go of arm rest in sitting to reach for support from high low table. Sit>stand then performed with HW x 2 with max assist from PT and visual feedback from mirror x 3 with heavy posterior lean. Pt performed sit stand in parallel bars x 2 with max assist. Mirror feedback from the side to visualize posterior lean. Pt able to correct with as little as mod assist. Gait training x 46ft with max assist to block stance LE and LUE on rail. Patient returned to room and left sitting in [facility] with call bell in reach and all needs met.    Session 2. Pt declined additional PT due to fatigue from treatment earlier in day. Left supine In bed with call bell in reach and all needs met        Therapy Documentation Precautions:  Precautions Precautions: Fall Precaution Comments: R hemiparesis Restrictions Weight Bearing Restrictions: No Pain: Pain Assessment Pain  Scale: 0-10 Pain Score: 0-No pain  Therapy/Group: Individual Therapy  Lorie Phenix 10/12/2019, 5:38 PM

## 2019-10-13 ENCOUNTER — Inpatient Hospital Stay (HOSPITAL_COMMUNITY): Payer: Medicare Other

## 2019-10-13 LAB — CBC WITH DIFFERENTIAL/PLATELET
Abs Immature Granulocytes: 0.09 10*3/uL — ABNORMAL HIGH (ref 0.00–0.07)
Basophils Absolute: 0.1 10*3/uL (ref 0.0–0.1)
Basophils Relative: 1 %
Eosinophils Absolute: 0.1 10*3/uL (ref 0.0–0.5)
Eosinophils Relative: 1 %
HCT: 25.2 % — ABNORMAL LOW (ref 36.0–46.0)
Hemoglobin: 8 g/dL — ABNORMAL LOW (ref 12.0–15.0)
Immature Granulocytes: 1 %
Lymphocytes Relative: 24 %
Lymphs Abs: 3.3 10*3/uL (ref 0.7–4.0)
MCH: 30 pg (ref 26.0–34.0)
MCHC: 31.7 g/dL (ref 30.0–36.0)
MCV: 94.4 fL (ref 80.0–100.0)
Monocytes Absolute: 1.2 10*3/uL — ABNORMAL HIGH (ref 0.1–1.0)
Monocytes Relative: 9 %
Neutro Abs: 9.1 10*3/uL — ABNORMAL HIGH (ref 1.7–7.7)
Neutrophils Relative %: 64 %
Platelets: 556 10*3/uL — ABNORMAL HIGH (ref 150–400)
RBC: 2.67 MIL/uL — ABNORMAL LOW (ref 3.87–5.11)
RDW: 13.2 % (ref 11.5–15.5)
WBC: 13.9 10*3/uL — ABNORMAL HIGH (ref 4.0–10.5)
nRBC: 0 % (ref 0.0–0.2)

## 2019-10-13 LAB — URINALYSIS, ROUTINE W REFLEX MICROSCOPIC
Bilirubin Urine: NEGATIVE
Glucose, UA: NEGATIVE mg/dL
Ketones, ur: NEGATIVE mg/dL
Nitrite: NEGATIVE
Protein, ur: NEGATIVE mg/dL
Specific Gravity, Urine: 1.017 (ref 1.005–1.030)
pH: 5 (ref 5.0–8.0)

## 2019-10-13 LAB — COMPREHENSIVE METABOLIC PANEL
ALT: 21 U/L (ref 0–44)
AST: 29 U/L (ref 15–41)
Albumin: 2.6 g/dL — ABNORMAL LOW (ref 3.5–5.0)
Alkaline Phosphatase: 85 U/L (ref 38–126)
Anion gap: 10 (ref 5–15)
BUN: 36 mg/dL — ABNORMAL HIGH (ref 8–23)
CO2: 25 mmol/L (ref 22–32)
Calcium: 9.2 mg/dL (ref 8.9–10.3)
Chloride: 97 mmol/L — ABNORMAL LOW (ref 98–111)
Creatinine, Ser: 1.27 mg/dL — ABNORMAL HIGH (ref 0.44–1.00)
GFR, Estimated: 38 mL/min — ABNORMAL LOW (ref >60)
Glucose, Bld: 212 mg/dL — ABNORMAL HIGH (ref 70–99)
Potassium: 5.1 mmol/L (ref 3.5–5.1)
Sodium: 132 mmol/L — ABNORMAL LOW (ref 135–145)
Total Bilirubin: 0.2 mg/dL — ABNORMAL LOW (ref 0.3–1.2)
Total Protein: 6.2 g/dL — ABNORMAL LOW (ref 6.5–8.1)

## 2019-10-13 LAB — TROPONIN I (HIGH SENSITIVITY): Troponin I (High Sensitivity): 5 ng/L (ref <18)

## 2019-10-13 MED ORDER — SODIUM CHLORIDE 0.9 % IV SOLN: Freq: Once | INTRAVENOUS | Status: AC

## 2019-10-13 MED ORDER — BISACODYL 10 MG RE SUPP: 10.0000 mg | Freq: Every day | RECTAL | Status: DC | PRN

## 2019-10-13 MED ORDER — SORBITOL 70 % SOLN
30.0000 mL | Freq: Every day | Status: DC | PRN
  Administered 2019-10-13: 30 mL via ORAL
  Filled 2019-10-13: qty 30

## 2019-10-13 NOTE — Significant Event (Addendum)
Rapid Response Event Note   Reason for Call :  Change in LOC and near syncopal episode  Initial Focused Assessment:  Received a call from charge RN requesting assistance with a patient that became unresponsive.  RN and NT were able to sit patient up in bed and she became more responsive.  MD at bedside when I got to the room.  Patient was able to answer all LOC questions appropriately.  Patient was pale.  Patient appears frail and I question hydration status  VS: BP 117/57, 80 EKG, 21 Resp, 98% O2 (room air)   Interventions:  MD ordered labs, IV team present to establish access, labs ordered, vitals taken  Plan of Care:  RN to get orthostatic vitals, sorbitol ordered to help with moving bowels, and RN made aware of high probability of patient having a vagal incident.  MD ordered NaCl IV fluids to help replenish fluids.   Event Summary:   MD Notified: Rehab MD at bedside Call Time: 0900 Arrival Time: 0910 End Time: 0940  Andrey Spearman, RN

## 2019-10-13 NOTE — Plan of Care (Signed)
  Problem: RH BOWEL ELIMINATION Goal: RH STG MANAGE BOWEL W/MEDICATION W/ASSISTANCE Description: STG Manage Bowel with Medication with Mod I Assistance. Outcome: Progressing   Problem: RH BLADDER ELIMINATION Goal: RH STG MANAGE BLADDER WITH ASSISTANCE Description: STG Manage Bladder With Mod I Assistance Outcome: Progressing   Problem: RH SKIN INTEGRITY Goal: RH STG SKIN FREE OF INFECTION/BREAKDOWN Description: Mod i Outcome: Progressing

## 2019-10-13 NOTE — Progress Notes (Signed)
Nurse I/o cath patient for urine sample 67ml collected not enough to preform lab. Kalman Shan, LPN

## 2019-10-13 NOTE — Progress Notes (Signed)
[facility] PHYSICAL MEDICINE & REHABILITATION PROGRESS NOTE  Subjective/Complaints:  Pt reports feeling nauseated this AM and her stomach hurts where has a tiny hematoma from Heparin shot in last 24 hours.   LBM 2 days ago-  Had episode I was called to bedside- where vagale/dpassed out when was got up in [place] to transfer to toilet- eyes rolled back in head/unresponsive for a few minutes- when laid back down, was sleepy, but waking up- pt said felt really weak, but felt better after ~ 10 minutes.  Called rapid response- got EKG- was normal- completely except low voltage.  Got stat labs- as below And Started IVFs 75cc hour for 1 L Also needs to have BM, per exam.    Also WBC is 13.9- up from 8- will check U/A  And Cx and CXR- Hb has also dropped to 8 from 8.8 and dry/BUN up  To 36 from 27 and Cr up to 1.27 from 1.16.     ROS: Pt denies SOB, abd pain, CP, N/V/C/D, and vision changes   Objective: Vital Signs: Blood pressure (!) 117/57, pulse 88, temperature 97.9 F (36.6 C), resp. rate (!) 21, height 5\' 2"  (1.575 m), weight 41.6 kg, SpO2 98 %. No results found. Recent Labs    10/11/19 0629 10/13/19 0912  WBC 8.1 13.9*  HGB 8.8* 8.0*  HCT 28.3* 25.2*  PLT 501* 556*   Recent Labs    10/11/19 0629 10/13/19 0912  NA 136 132*  K 3.7 5.1  CL 101 97*  CO2 26 25  GLUCOSE 105* 212*  BUN 27* 36*  CREATININE 1.16* 1.27*  CALCIUM 9.0 9.2    Intake/Output Summary (Last 24 hours) at 10/13/2019 1030 Last data filed at 10/12/2019 2016 Gross per 24 hour  Intake 540 ml  Output --  Net 540 ml        Physical Exam: BP (!) 117/57 (BP Location: Left Arm)    Pulse 88    Temp 97.9 F (36.6 C)    Resp (!) 21    Ht 5\' 2"  (1.575 m)    Wt 41.6 kg    SpO2 98%    BMI 16.77 kg/m  Constitutional: frail, sitting up in bed- 1st time appropriate, but pale; 2nd time, more pale, waking up and becoming more with it/appropriate, responsive both times I saw her.  HENT: Normocephalic.   Atraumatic. Eyes: conjugate gaze Cardiovascular: RR no JVD Respiratory: CTA B/L- no W/R/R- good air movement GI: firm on L side- with tiny hematoma on L side from Heparin shot- that's TTP- very thin Skin: Neck incision healing. No change No wounds on back Psych: slowed processing Musc: No edema in extremities.  No tenderness in extremities. Right fourth digit PIP flexion contracture Kyphosis. Neuro: Alert and oriented Mild dysarthria, unchanged. Motor: RUE: Shoulder abduction 2/5, elbow flexion/extension 1/5, handgrip 1/5  Right lower extremity: Hip flexion, knee extension 4-/5, ankle dorsiflexion 4+/5  Assessment/Plan: 1. Functional deficits secondary to left frontoparietal infarct which require 3+ hours per day of interdisciplinary therapy in a comprehensive inpatient rehab setting.  Physiatrist is providing close team supervision and 24 hour management of active medical problems listed below.  Physiatrist and rehab team continue to assess barriers to discharge/monitor patient progress toward functional and medical goals   Care Tool:  Bathing  Bathing activity did not occur: Refused Body parts bathed by patient: Chest, Abdomen, Front perineal area, Right upper leg, Left upper leg, Face   Body parts bathed by helper: Right arm,  Left arm, Buttocks, Right lower leg, Left lower leg     Bathing assist Assist Level: Maximal Assistance - Patient 24 - 49%     Upper Body Dressing/Undressing Upper body dressing   What is the patient wearing?: Pull over shirt    Upper body assist Assist Level: Maximal Assistance - Patient 25 - 49%    Lower Body Dressing/Undressing Lower body dressing      What is the patient wearing?: Pants, Incontinence brief     Lower body assist Assist for lower body dressing: Total Assistance - Patient < 25%     Toileting Toileting    Toileting assist Assist for toileting: Total Assistance - Patient < 25%     Transfers Chair/bed  transfer  Transfers assist  Chair/bed transfer activity did not occur: Safety/medical concerns  Chair/bed transfer assist level: Maximal Assistance - Patient 25 - 49%     Locomotion Ambulation   Ambulation assist   Ambulation activity did not occur: Safety/medical concerns (R hemi, fatigue,generalized weakness, decresed balance/postural control)          Walk 10 feet activity   Assist  Walk 10 feet activity did not occur: Safety/medical concerns (R hemi, fatigue,generalized weakness, decresed balance/postural control)        Walk 50 feet activity   Assist Walk 50 feet with 2 turns activity did not occur: Safety/medical concerns (R hemi, fatigue,generalized weakness, decresed balance/postural control)         Walk 150 feet activity   Assist Walk 150 feet activity did not occur: Safety/medical concerns (R hemi, fatigue,generalized weakness, decresed balance/postural control)         Walk 10 feet on uneven surface  activity   Assist Walk 10 feet on uneven surfaces activity did not occur: Safety/medical concerns (R hemi, fatigue,generalized weakness, decresed balance/postural control)         Wheelchair     Assist Will patient use wheelchair at discharge?: Yes Type of Wheelchair: Manual    Wheelchair assist level: Moderate Assistance - Patient 50 - 74% Max wheelchair distance: 61ft    Wheelchair 50 feet with 2 turns activity    Assist        Assist Level: Total Assistance - Patient < 25%   Wheelchair 150 feet activity     Assist      Assist Level: Dependent - Patient 0%    Medical Problem List and Plan: 1.  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 Pt s/p left CEA.  Continue CIR 2.  Antithrombotics: -DVT/anticoagulation:  Pharmaceutical:  Continue heparin             -antiplatelet therapy: on ASA 325/Plavix 75 mg daily.  3. Pain Management:    As needed medications 4. Mood: LCSW to follow for evaluation and support.              -antipsychotic agents: N/A 5. Neuropsych: This patient is capable of making decisions on her own behalf. 6. Skin/Wound Care: Monitor incision for healing. Routine pressure relief measures.  7. Fluids/Electrolytes/Nutrition: Monitor I/Os.  8. HTN:   Inderal 60 mg bid  Monitor with increased mobility  Controlled on 10/8  10/10- BP slightly soft 117/78 right after vagal response 9. CKD IIIa/b: Encourage fluid intake.   Creatinine 1.16 on 10/8  10/9- labs weekly/Monday  10/10- Cr up to 1.27 and BUN 36- will give IVFs 1L 75cc/hour and recheck in AM  Continue to monitor 10. ABLA:  Hemoglobin 8.8 on 10/8, trending down, labs ordered for Monday  10/10- Hb down to 8.0 when more dry-   Hemoccult ordered- can't find hemoccult results  Continue to monitor 11. Anxiety/depression:  On valium bid to help manage anxiety state. Well controlled with medication.   Fluoxetine DC'd due to Plavix 12. IBS--diarrhea: Questran on hold 13.  Hyponatremia  Sodium 136 on 10/8  10/10- Na 132  Continue to monitor 14.  Severe hypoalbuminemia  Supplement initiated on 10/5 15. BMI 16.77- dietary consult for calorie count, assess nutritional needs.  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 Dizzy  10/9- dizzy/woozy this AM- OT/nursing to check orthostatics and let me know if needs intervention  10/10- had passing out/vagal episode Sunday AM- EKG OK- Troponin 5- could be demand-  18. Constipation  10/9- LBM 2 days ago- usually has IBS with diarrhea- if no BM by tomorrow, will intervene.  10/10- will give sorbitol since hadsn't gone in 2-3 days- and has distension in abd from stool    I spent a total of 40 minutes on total care this AM- calling rapid response, staying at bedside the entire time, as documented above.    LOS: 6 days A FACE TO FACE EVALUATION WAS PERFORMED  Murtaza Shell 10/13/2019, 10:30 AM

## 2019-10-13 NOTE — Progress Notes (Addendum)
This nurse was called into the room by the nurse tech. When the  nurse arrived in the room the patient was non responsive, pale skin, eye fixed and cold to touch. Rapid response and on call md Dr. Janett Billow was contacted. New orders were for 37ml of ns  x1 liter, cbc,cmp,  troponin levels and ekg.   Patient regained conscious and alert x2 complains of left lower abdominal pain.  Resting in bed states she is feeling tired. Kalman Shan, LPN

## 2019-10-14 ENCOUNTER — Inpatient Hospital Stay (HOSPITAL_COMMUNITY): Payer: Medicare Other

## 2019-10-14 ENCOUNTER — Inpatient Hospital Stay (HOSPITAL_COMMUNITY): Payer: Medicare Other | Admitting: Occupational Therapy

## 2019-10-14 DIAGNOSIS — D72829 Elevated white blood cell count, unspecified: Secondary | ICD-10-CM

## 2019-10-14 DIAGNOSIS — N179 Acute kidney failure, unspecified: Secondary | ICD-10-CM

## 2019-10-14 DIAGNOSIS — Z8679 Personal history of other diseases of the circulatory system: Secondary | ICD-10-CM | POA: Insufficient documentation

## 2019-10-14 LAB — CBC
HCT: 17.1 % — ABNORMAL LOW (ref 36.0–46.0)
Hemoglobin: 5.3 g/dL — CL (ref 12.0–15.0)
MCH: 29.4 pg (ref 26.0–34.0)
MCHC: 31 g/dL (ref 30.0–36.0)
MCV: 95 fL (ref 80.0–100.0)
Platelets: 426 10*3/uL — ABNORMAL HIGH (ref 150–400)
RBC: 1.8 MIL/uL — ABNORMAL LOW (ref 3.87–5.11)
RDW: 13.3 % (ref 11.5–15.5)
WBC: 12.3 10*3/uL — ABNORMAL HIGH (ref 4.0–10.5)
nRBC: 0 % (ref 0.0–0.2)

## 2019-10-14 LAB — CBC WITH DIFFERENTIAL/PLATELET
Abs Immature Granulocytes: 0 10*3/uL (ref 0.00–0.07)
Basophils Absolute: 0 10*3/uL (ref 0.0–0.1)
Basophils Relative: 0 %
Eosinophils Absolute: 0 10*3/uL (ref 0.0–0.5)
Eosinophils Relative: 0 %
HCT: 18.4 % — ABNORMAL LOW (ref 36.0–46.0)
Hemoglobin: 5.8 g/dL — CL (ref 12.0–15.0)
Lymphocytes Relative: 28 %
Lymphs Abs: 3.7 10*3/uL (ref 0.7–4.0)
MCH: 30.4 pg (ref 26.0–34.0)
MCHC: 31.5 g/dL (ref 30.0–36.0)
MCV: 96.3 fL (ref 80.0–100.0)
Monocytes Absolute: 0.1 10*3/uL (ref 0.1–1.0)
Monocytes Relative: 1 %
Neutro Abs: 9.3 10*3/uL — ABNORMAL HIGH (ref 1.7–7.7)
Neutrophils Relative %: 71 %
Platelets: 500 10*3/uL — ABNORMAL HIGH (ref 150–400)
RBC: 1.91 MIL/uL — ABNORMAL LOW (ref 3.87–5.11)
RDW: 13.4 % (ref 11.5–15.5)
WBC: 13.1 10*3/uL — ABNORMAL HIGH (ref 4.0–10.5)
nRBC: 0 % (ref 0.0–0.2)
nRBC: 0 /100 WBC

## 2019-10-14 LAB — BASIC METABOLIC PANEL
Anion gap: 12 (ref 5–15)
BUN: 45 mg/dL — ABNORMAL HIGH (ref 8–23)
CO2: 22 mmol/L (ref 22–32)
Calcium: 8.8 mg/dL — ABNORMAL LOW (ref 8.9–10.3)
Chloride: 99 mmol/L (ref 98–111)
Creatinine, Ser: 1.84 mg/dL — ABNORMAL HIGH (ref 0.44–1.00)
GFR, Estimated: 24 mL/min — ABNORMAL LOW (ref >60)
Glucose, Bld: 112 mg/dL — ABNORMAL HIGH (ref 70–99)
Potassium: 4.6 mmol/L (ref 3.5–5.1)
Sodium: 133 mmol/L — ABNORMAL LOW (ref 135–145)

## 2019-10-14 LAB — PREPARE RBC (CROSSMATCH)

## 2019-10-14 LAB — OCCULT BLOOD X 1 CARD TO LAB, STOOL: Fecal Occult Bld: NEGATIVE

## 2019-10-14 LAB — ABO/RH: ABO/RH(D): O POS

## 2019-10-14 MED ORDER — SODIUM CHLORIDE 0.9 % IV SOLN: INTRAVENOUS | Status: DC

## 2019-10-14 MED ORDER — SODIUM CHLORIDE 0.9% IV SOLUTION: Freq: Once | INTRAVENOUS | Status: AC

## 2019-10-14 MED ORDER — SODIUM CHLORIDE 0.9 % IV SOLN
2.0000 g | INTRAVENOUS | Status: DC
  Administered 2019-10-14: 2 g via INTRAVENOUS
  Filled 2019-10-14 (×3): qty 2

## 2019-10-14 MED ORDER — ASPIRIN EC 81 MG PO TBEC
81.0000 mg | DELAYED_RELEASE_TABLET | Freq: Every day | ORAL | Status: DC
  Administered 2019-10-15: 81 mg via ORAL
  Filled 2019-10-14: qty 1

## 2019-10-14 NOTE — Progress Notes (Signed)
Occupational Therapy Note  Patient Details  Name: Elizabeth Tapia MRN: [id number] Date of Birth: 1929/08/14  Today's Date: 10/14/2019 OT Missed Time: 60 Minutes Missed Time Reason: Other (comment) (RN hold, hemoglobin 5.8)  Pt missed 60 mins scheduled OT treatment session secondary to hemoglobin 5.8.  RN reports MD ordered stat IV fluids and repeat lab draw, and requests that therapy hold at this time due to critical lab value.    Rosalio Loud 10/14/2019, 9:40 AM

## 2019-10-14 NOTE — Progress Notes (Signed)
Physical Therapy Session Note  Patient Details  Name: Elizabeth Tapia MRN: [id number] Date of Birth: 1929/09/10  Today's Date: 10/14/2019 PT Missed Time: 30 Minutes Missed Time Reason: Patient fatigue;Other (Comment) (receiving blood transfusion)  Short Term Goals: Week 1:  PT Short Term Goal 1 (Week 1): Pt will perform bed mobility with supervision PT Short Term Goal 2 (Week 1): Pt wil transfer sit<>stand with LRAD min A PT Short Term Goal 3 (Week 1): Pt will perform simulated car transfer with LRAD mod A  Skilled Therapeutic Interventions/Progress Updates:   Received pt supine in bed asleep with blood transfusion started, upon arousal pt reported feeling tired and requested to rest in bed. Per RN, pt is lethargic and has been sleeping all day and recommends to hold on remainder of therapy today. 30 minutes missed of skilled physical therapy due to fatigue and ongoing blood transfusion.   Therapy Documentation Precautions:  Precautions Precautions: Fall Precaution Comments: R hemiparesis Restrictions Weight Bearing Restrictions: No   Therapy/Group: Individual Therapy Martin Majestic PT, DPT   10/14/2019, 11:46 AM

## 2019-10-14 NOTE — Progress Notes (Signed)
Physical Therapy Session Note  Patient Details  Name: Reily Treloar MRN: [id number] Date of Birth: 1929-06-28  Today's Date: 10/14/2019 PT Missed Time: 60 Minutes Missed Time Reason: Other (Comment) (Low hemoglobin values; awaiting blood transfusion)  Skilled Therapeutic Interventions/Progress Updates:   Pt missed 60 minutes scheduled PT session due to hemoglobin value 5.1. Per RN, pt awaiting blood transfusion and requested to hold therapy at this time due to lab value.   Therapy Documentation Precautions:  Precautions Precautions: Fall Precaution Comments: R hemiparesis Restrictions Weight Bearing Restrictions: No  Therapy/Group: Individual Therapy Martin Majestic PT, DPT   10/14/2019, 7:31 AM

## 2019-10-14 NOTE — Progress Notes (Signed)
On call provider Dan, [place]  notified about completed blood transfusion, 2 units. Pt denies pain, no obvious signs of distress, all VS WNL. No new orders given at this time.

## 2019-10-14 NOTE — Progress Notes (Signed)
Pharmacy Antibiotic Note  Elizabeth Tapia is a 86 y.o. female admitted to inpatient rehab on 10/07/2019.  Pt has developed leukocytosis and CXR concerning for PNA.  Pharmacy has been consulted for Cefepime dosing.  Also noted AKI with SCr up 1.84.  Plan: Cefepime 2gm IV q24h  Height: 5\' 2"  (157.5 cm) Weight: 41.6 kg (91 lb 11.4 oz) IBW/kg (Calculated) : 50.1  Temp (24hrs), Avg:98.6 F (37 C), Min:97.9 F (36.6 C), Max:99.6 F (37.6 C)  Recent Labs  Lab 10/08/19 0601 10/11/19 0629 10/13/19 0912 10/14/19 0700 10/14/19 0754  WBC 9.4 8.1 13.9*  --  13.1*  CREATININE 1.12* 1.16* 1.27* 1.84*  --     Estimated Creatinine Clearance: 13.9 mL/min (A) (by C-G formula based on SCr of 1.84 mg/dL (H)).    No Known Allergies  Antimicrobials this admission: Cefepime  10/11 >>   Dose adjustments this admission:   Microbiology results:  10/10 UCx:    Thank you for allowing pharmacy to be a part of this patient's care.  12/14/19, Pharm.D., BCPS Clinical Pharmacist Clinical phone for 10/14/2019 from 8:30-4:00 is [phone].  **Pharmacist phone directory can be found on [email or web address] listed under [facility].  10/14/2019 1:05 PM

## 2019-10-14 NOTE — Progress Notes (Signed)
Speech Language Pathology Weekly Progress and Session Note  Patient Details  Name: Elizabeth Tapia MRN: [id number] Date of Birth: [birth year]  Beginning of progress report period: October 08, 2019 End of progress report period: October 14, 2019  Today's Date: 10/14/2019 SLP Individual Time: 0998-3382 SLP Individual Time Calculation (min): 28 min  Short Term Goals: Week 1: SLP Short Term Goal 1 (Week 1): Pt will demonstrate sustained attention in 30 minute intervals with min A verbal cues during mildly complex tasks. SLP Short Term Goal 1 - Progress (Week 1): Not met SLP Short Term Goal 2 (Week 1): Pt will demonstrate basic to mildly complex problem solving skills with min A verbal cues. SLP Short Term Goal 2 - Progress (Week 1): Met SLP Short Term Goal 3 (Week 1): Pt will demonstrate self-monitoring and self-correcting of functional errors in problem solving tasks with min A verbal cues. SLP Short Term Goal 3 - Progress (Week 1): Not met SLP Short Term Goal 4 (Week 1): Pt will demonstrate recall of daily information (including speech and swallow strategies) with min A verbal cues for visual aids. SLP Short Term Goal 4 - Progress (Week 1): Not met SLP Short Term Goal 5 (Week 1): Pt will utilize swallow strategies when consuming dys 3 textures and thin liquids via cup with minimal overt s/s aspiration and min A verbal cues. SLP Short Term Goal 5 - Progress (Week 1): Met SLP Short Term Goal 6 (Week 1): Pt will increase vocal intensity to 85% intelligibility at the sentence level and clear wet vocal quality with min A verbal cues. SLP Short Term Goal 6 - Progress (Week 1): Met    New Short Term Goals: Week 2: SLP Short Term Goal 1 (Week 2): Pt will demonstrate sustained attention in 30 minute intervals with min A verbal cues during mildly complex tasks. SLP Short Term Goal 2 (Week 2): Pt will demonstrate mildly complex problem solving skills with min A verbal cues. SLP Short Term Goal 3 (Week  2): Pt will demonstrate self-monitoring and self-correcting of functional errors in problem solving tasks with min A verbal cues. SLP Short Term Goal 4 (Week 2): Pt will demonstrate recall of daily information (including speech and swallow strategies) with min A verbal cues for visual aids. SLP Short Term Goal 5 (Week 2): Pt will utilize swallow strategies when consuming dys 3 textures and thin liquids via cup with minimal overt s/s aspiration and supervision A verbal cues. SLP Short Term Goal 6 (Week 2): Pt will demonstrate 85% intelligibility at sentence level with supervision A verbal cues to increase vocal intensity in a mildly nosiy environment..  Weekly Progress Updates: Pt made moderate progress meeting 3 out 6 goals this reporting period noting medical decline at the end of the week requiring blood transfusion. Pt made progress in basic problem solving, recall with visual aids, increasing vocal intensity/speech intelligibility in a quiet environment and use of swallow strategies. SLP reduced supervision level to set up assist and intermittent supervision. Pt continues to demonstrate deficits in emergent awareness, mildly complex problem solving, sustained attention, short term recall and continuous use of swallow strategies. Pt would continue to benefit from skilled ST services in order to maximize functional independence and reduce burden of care, likely requiring supervision at discharge with continued skilled ST services.    Intensity: Minumum of 1-2 x/day, 30 to 90 minutes Frequency: 3 to 5 out of 7 days Duration/Length of Stay: 10/26 Treatment/Interventions: Cognitive remediation/compensation;Cueing hierarchy;Dysphagia/aspiration precaution training;Functional tasks;Medication managment;Patient/family education;Internal/external aids  Daily Session  Skilled Therapeutic Interventions: Skilled ST services focused on swallow and cognitive skills. Pt was participating in blood transfusion  when SLP entered room. Pt was agreeable to participate in skilled St services to consume small amounts of thin liquids and for education pertaining to current medications. Pt was unaware of blood transfusion taking place and required mod A verbal/visual cues to recall following education. Pt demonstrated recall of swallow strategies with mod A verbal cues. Pt requested tea, but only allowed SLP to reposition to 45 angle, therefore SLP provided thin liquid via TSP. Pt demonstrated x1 immediate cough and requested to end PO consumption. SLP recorded and educated pt on current medication name/function and times per day. Pt was able to recall medication name with max A verbal cues for visual aid. SLP ended services due to fatigue. Pt was left in room with call bell within reach and bed alarm set. SLP recommends to continue skilled services.    General    Pain Pain Assessment Pain Score: 0-No pain  Therapy/Group: Individual Therapy  Bethany Hirt  [facility] 10/14/2019, 1:39 PM

## 2019-10-14 NOTE — Progress Notes (Signed)
[facility] PHYSICAL MEDICINE & REHABILITATION PROGRESS NOTE  Subjective/Complaints: Patient seen sitting up in bed this morning.  She states she slept well overnight.  She denies complaints.  Called by nursing regarding critical lab value.  ROS: Denies CP, SOB, N/V/D  Objective: Vital Signs: Blood pressure (!) 105/44, pulse 78, temperature 99.6 F (37.6 C), temperature source Oral, resp. rate 18, height 5\' 2"  (1.575 m), weight 41.6 kg, SpO2 97 %. DG CHEST PORT 1 VIEW  Result Date: 10/13/2019 CLINICAL DATA:  Leukocytosis EXAM: PORTABLE CHEST 1 VIEW COMPARISON:  February 07, 2016 FINDINGS: The cardiomediastinal silhouette is unchanged in contour.Tortuous thoracic aorta. Atherosclerotic calcifications of the aorta. No pleural effusion. No pneumothorax. New LEFT basilar linear opacity, most likely atelectasis. Surgical clips project over the RIGHT upper quadrant. Multilevel degenerative changes of the thoracic spine. IMPRESSION: New LEFT basilar linear opacity, most likely atelectasis. Infection remains in the differential. Electronically Signed   By: February 09, 2016 MD   On: 10/13/2019 17:05   Recent Labs    10/13/19 0912 10/14/19 0754  WBC 13.9* 13.1*  HGB 8.0* 5.8*  HCT 25.2* 18.4*  PLT 556* 500*   Recent Labs    10/13/19 0912 10/14/19 0700  NA 132* 133*  K 5.1 4.6  CL 97* 99  CO2 25 22  GLUCOSE 212* 112*  BUN 36* 45*  CREATININE 1.27* 1.84*  CALCIUM 9.2 8.8*    Intake/Output Summary (Last 24 hours) at 10/14/2019 0831 Last data filed at 10/14/2019 0708 Gross per 24 hour  Intake 1505 ml  Output 210 ml  Net 1295 ml        Physical Exam: BP (!) 105/44   Pulse 78   Temp 99.6 F (37.6 C) (Oral)   Resp 18   Ht 5\' 2"  (1.575 m)   Wt 41.6 kg   SpO2 97%   BMI 16.77 kg/m  Constitutional: No distress . Vital signs reviewed. HENT: Normocephalic.  Atraumatic. Eyes: EOMI. No discharge. Cardiovascular: No JVD.  RRR. Respiratory: Normal effort.  No stridor.  Bilateral  clear to auscultation. GI: Non-distended.  BS +. Skin: Warm and dry.  Neck incision healing. Psych: Normal mood.  Somewhat slowed. Musc: No edema in extremities.  No tenderness in extremities. Right fourth digit PIP flexion contracture, unchanged Kyphosis. Neuro: Alert and oriented Mild dysarthria, stable Motor: RUE: Shoulder abduction 2+/5, elbow flexion/extension 2+/5, handgrip 2+/5  Right lower extremity: Hip flexion, knee extension 4-/5, ankle dorsiflexion 4+/5  Assessment/Plan: 1. Functional deficits secondary to left frontoparietal infarct which require 3+ hours per day of interdisciplinary therapy in a comprehensive inpatient rehab setting.  Physiatrist is providing close team supervision and 24 hour management of active medical problems listed below.  Physiatrist and rehab team continue to assess barriers to discharge/monitor patient progress toward functional and medical goals   Care Tool:  Bathing  Bathing activity did not occur: Refused Body parts bathed by patient: Chest, Abdomen, Front perineal area, Right upper leg, Left upper leg, Face   Body parts bathed by helper: Right arm, Left arm, Buttocks, Right lower leg, Left lower leg     Bathing assist Assist Level: Maximal Assistance - Patient 24 - 49%     Upper Body Dressing/Undressing Upper body dressing   What is the patient wearing?: Pull over shirt    Upper body assist Assist Level: Maximal Assistance - Patient 25 - 49%    Lower Body Dressing/Undressing Lower body dressing      What is the patient wearing?: Pants, Incontinence brief  Lower body assist Assist for lower body dressing: Total Assistance - Patient < 25%     Toileting Toileting    Toileting assist Assist for toileting: Total Assistance - Patient < 25%     Transfers Chair/bed transfer  Transfers assist  Chair/bed transfer activity did not occur: Safety/medical concerns  Chair/bed transfer assist level: Maximal Assistance -  Patient 25 - 49%     Locomotion Ambulation   Ambulation assist   Ambulation activity did not occur: Safety/medical concerns (R hemi, fatigue,generalized weakness, decresed balance/postural control)          Walk 10 feet activity   Assist  Walk 10 feet activity did not occur: Safety/medical concerns (R hemi, fatigue,generalized weakness, decresed balance/postural control)        Walk 50 feet activity   Assist Walk 50 feet with 2 turns activity did not occur: Safety/medical concerns (R hemi, fatigue,generalized weakness, decresed balance/postural control)         Walk 150 feet activity   Assist Walk 150 feet activity did not occur: Safety/medical concerns (R hemi, fatigue,generalized weakness, decresed balance/postural control)         Walk 10 feet on uneven surface  activity   Assist Walk 10 feet on uneven surfaces activity did not occur: Safety/medical concerns (R hemi, fatigue,generalized weakness, decresed balance/postural control)         Wheelchair     Assist Will patient use wheelchair at discharge?: Yes Type of Wheelchair: Manual    Wheelchair assist level: Moderate Assistance - Patient 50 - 74% Max wheelchair distance: 19ft    Wheelchair 50 feet with 2 turns activity    Assist        Assist Level: Total Assistance - Patient < 25%   Wheelchair 150 feet activity     Assist      Assist Level: Dependent - Patient 0%    Medical Problem List and Plan: 1.  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 Pt s/p left CEA.  Continue CIR 2.  Antithrombotics: -DVT/anticoagulation:  Pharmaceutical:  Continue heparin             -antiplatelet therapy: on ASA 325/Plavix 75 mg daily.  3. Pain Management:   As needed medications 4. Mood: LCSW to follow for evaluation and support.              -antipsychotic agents: N/A 5. Neuropsych: This patient is  capable of making decisions on her own behalf. 6. Skin/Wound Care: Monitor incision for healing. Routine pressure relief measures.  7. Fluids/Electrolytes/Nutrition: Monitor I/Os.  8. HTN:   Inderal 60 mg bid, hold this a.m.  Monitor with increased mobility  Soft on 10/11,?  Secondary to hemoglobin 9.  AKI on CKD IIIa/b: Encourage fluid intake.   Creatinine 1.84 on 10/11  Echo reviewed, EF 70-7 5%, IVF nightly started on 10/11  Continue to monitor 10. ABLA:   Hemoglobin critical value of 5.8 on 10/11, repeat stat labs ordered  Transfuse 2 units  Hemoccult ordered- can't find hemoccult results  Continue to monitor 11. Anxiety/depression:  On valium bid to help manage anxiety state. Well controlled with medication.   Fluoxetine DC'd due to Plavix 12. IBS--diarrhea: Questran on hold 13.  Hyponatremia  Sodium 133 on 10/11  Continue to monitor 14.  Severe hypoalbuminemia  Supplement initiated on 10/5 15. BMI 16.77- dietary consult for calorie count, assess nutritional needs.  16.  Post stroke dysphagia  D3 thins  Advance  diet as tolerated  17.  Leukocytosis  WBC 13.1 on 10/8  Afebrile  UA equivocal, urine culture pending  Chest x-ray personally reviewed, showing left opacity,?  Atelectasis vs PNA  Empiric IV cefepime started   LOS: 7 days A FACE TO FACE EVALUATION WAS PERFORMED  Darwin Rothlisberger Karis Juba 10/14/2019, 8:31 AM

## 2019-10-15 ENCOUNTER — Inpatient Hospital Stay (HOSPITAL_COMMUNITY): Payer: Medicare Other

## 2019-10-15 ENCOUNTER — Inpatient Hospital Stay (HOSPITAL_COMMUNITY): Payer: Medicare Other | Admitting: Occupational Therapy

## 2019-10-15 DIAGNOSIS — N390 Urinary tract infection, site not specified: Secondary | ICD-10-CM

## 2019-10-15 DIAGNOSIS — K6811 Postprocedural retroperitoneal abscess: Secondary | ICD-10-CM

## 2019-10-15 DIAGNOSIS — T148XXA Other injury of unspecified body region, initial encounter: Secondary | ICD-10-CM

## 2019-10-15 LAB — TYPE AND SCREEN
ABO/RH(D): O POS
Antibody Screen: NEGATIVE
Unit division: 0
Unit division: 0

## 2019-10-15 LAB — CBC WITH DIFFERENTIAL/PLATELET
Abs Immature Granulocytes: 0.07 10*3/uL (ref 0.00–0.07)
Basophils Absolute: 0.1 10*3/uL (ref 0.0–0.1)
Basophils Relative: 1 %
Eosinophils Absolute: 0.1 10*3/uL (ref 0.0–0.5)
Eosinophils Relative: 1 %
HCT: 30.9 % — ABNORMAL LOW (ref 36.0–46.0)
Hemoglobin: 10.2 g/dL — ABNORMAL LOW (ref 12.0–15.0)
Immature Granulocytes: 1 %
Lymphocytes Relative: 19 %
Lymphs Abs: 2.1 10*3/uL (ref 0.7–4.0)
MCH: 29.1 pg (ref 26.0–34.0)
MCHC: 33 g/dL (ref 30.0–36.0)
MCV: 88.3 fL (ref 80.0–100.0)
Monocytes Absolute: 1.1 10*3/uL — ABNORMAL HIGH (ref 0.1–1.0)
Monocytes Relative: 10 %
Neutro Abs: 7.2 10*3/uL (ref 1.7–7.7)
Neutrophils Relative %: 68 %
Platelets: 410 10*3/uL — ABNORMAL HIGH (ref 150–400)
RBC: 3.5 MIL/uL — ABNORMAL LOW (ref 3.87–5.11)
RDW: 14.5 % (ref 11.5–15.5)
WBC: 10.7 10*3/uL — ABNORMAL HIGH (ref 4.0–10.5)
nRBC: 0 % (ref 0.0–0.2)

## 2019-10-15 LAB — BPAM RBC
Blood Product Expiration Date: 202111062359
Blood Product Expiration Date: 202111062359
ISSUE DATE / TIME: 202110111231
ISSUE DATE / TIME: 202110111231
Unit Type and Rh: 5100
Unit Type and Rh: 5100

## 2019-10-15 LAB — URINE CULTURE: Culture: >=100000 — AB

## 2019-10-15 MED ORDER — CEFDINIR 300 MG PO CAPS
300.0000 mg | ORAL_CAPSULE | Freq: Two times a day (BID) | ORAL | Status: DC
  Administered 2019-10-15 – 2019-10-24 (×18): 300 mg via ORAL
  Filled 2019-10-15 (×19): qty 1

## 2019-10-15 MED ORDER — IOHEXOL 9 MG/ML PO SOLN
500.0000 mL | ORAL | Status: AC
  Administered 2019-10-15: 500 mL via ORAL

## 2019-10-15 MED ORDER — IOHEXOL 9 MG/ML PO SOLN
ORAL | Status: AC
  Filled 2019-10-15: qty 1000

## 2019-10-15 NOTE — Progress Notes (Signed)
CT called and contrast obtained, patient coughed several times when trying to administer contrast PO. Contrast not given due to safety and CT called and notified it was not safe for patient to drink contrast. CT agreed to take patient down. Patient still complaining of abd pain.

## 2019-10-15 NOTE — Progress Notes (Signed)
Occupational Therapy Weekly Progress Note  Patient Details  Name: Elizabeth Tapia MRN: [id number] Date of Birth: August 14, 1929  Beginning of progress report period: October 08, 2019 End of progress report period: October 15, 2019  Today's Date: 10/15/2019 OT Individual Time: 0102-7253 OT Individual Time Calculation (min): 53 min    Patient has met 0 of 4 short term goals.  Pt has made limited progress towards goals. Pt currently requires max-total assist for squat pivot transfers due to posterior lean and decreased sequencing with transitional movements.  Pt requires max assist for sit > stand, improving to mod when provided with UE support (table, counter) and visual feedback from mirror.  Pt continues to demonstrate extreme lumbar flexion, requiring visual and tactile cues for improved upright posture in sitting and standing.  Pt continues to require mod-max assist for self-care tasks due to decreased functional use of dominant RUE and postural control. As of 10/10 pt with decreased participation due to fluctuating levels of arousal, extreme fatigue, and low hemoglobin.  On 10/12 pt with pain in LLQ of abdomen impacting mobility.  Patient continues to demonstrate the following deficits: muscle weakness, decreased cardiorespiratoy endurance, impaired timing and sequencing, abnormal tone, unbalanced muscle activation, decreased coordination and decreased motor planning, decreased awareness, decreased problem solving, decreased safety awareness and decreased memory and decreased sitting balance, decreased standing balance, decreased postural control, hemiplegia and decreased balance strategies and therefore will continue to benefit from skilled OT intervention to enhance overall performance with BADL and Reduce care partner burden.  Patient progressing toward long term goals..  Continue plan of care.  OT Short Term Goals Week 1:  OT Short Term Goal 1 (Week 1): Pt will complete LB dressing with max  assist at sit > stand level OT Short Term Goal 1 - Progress (Week 1): Progressing toward goal OT Short Term Goal 2 (Week 1): Pt will complete toilet transfers max assist OT Short Term Goal 2 - Progress (Week 1): Progressing toward goal OT Short Term Goal 3 (Week 1): Pt will complete bathing with mod assist at sit > stand level OT Short Term Goal 4 (Week 1): Pt will utilize RUE as gross assist during self-care tasks Week 2:  OT Short Term Goal 1 (Week 2): Pt will complete LB dressing with max assist at sit > stand level OT Short Term Goal 2 (Week 2): Pt will complete toilet transfers max assist OT Short Term Goal 3 (Week 2): Pt will complete bathing with mod assist at sit > stand level OT Short Term Goal 4 (Week 2): Pt will utilize RUE as gross assist during self-care tasks  Skilled Therapeutic Interventions/Progress Updates:    Treatment session with focus on bed mobility, squat pivot transfers, sit > stand, and participation in bathing and dressing tasks at sink.  Pt received semi-reclined in bed with improvements in color and RN stating hemoglobin up.  Pt agreeable to getting OOB to engage in bathing and dressing at sink.  Pt completed bed mobility with max assist this session and total assist squat pivot transfer to w/c.  Pt with c/o severe pain in LLQ of abdomen with bed mobility and transfers, notified RN.  Engaged in bathing seated at sink with pt washing chest and Rt arm, did not attempt to utilize RUE to wash Lt arm.  Noted increased swelling in LUE as IV site, probable infiltration from IV fluid - LPN removed IV.  Completed sit > stand with max - total assist at sink with use of mirror  for visual input.  Pt with decreased sequencing and ability to weight shift this session, not using mirror for input due to extreme flexed posture.  Noted pt to require increased time and increased stimulus to engage in LB bathing and dressing.  Pt demonstrating decreased arousal with difficulty keeping eyes open.   Therefore therapist chose to transfer pt back to bed to complete LB dressing and allow pt to rest.  Required +2 assist for squat pivot transfer back to bed with no active participation.  Engaged in rolling total assist to pull pants over hips and positioned semi-reclined in bed.  Pt someone more alert once supine, but reports extreme fatigue.  BP 120/47 once supine.    Notified RN of concerns due to decreased arousal with upright activity and c/o pain with mobility.  Therapy Documentation Precautions:  Precautions Precautions: Fall Precaution Comments: R hemiparesis Restrictions Weight Bearing Restrictions: No General:   Vital Signs: Therapy Vitals Temp: 97.6 F (36.4 C) Temp Source: Oral Pulse Rate: 83 Resp: 18 BP: 116/61 Patient Position (if appropriate): Sitting Oxygen Therapy SpO2: 96 % O2 Device: Room Air Pain:  Pt with c/o extreme pain in LLQ with mobility, not rated.  RN aware and premedicated.   Therapy/Group: Individual Therapy  Simonne Come 10/15/2019, 7:21 AM

## 2019-10-15 NOTE — Progress Notes (Signed)
CT abdomen pelvis showed large left lower rectus sheath hematoma tracking along the left pelvic sidewall and space of Retzius.  There is also abnormal density around the abdominal aorta suspicious for retroperitoneal hematoma, acute aortic leak/hemorrhage.  Consulted vascular surgery in regards to possible aortic leak.  Spoke with general surgery in regards to left lower rectus sheath hematoma at this time they recommended conservative care no plan for surgical intervention at this time however they did feel that aspirin and Plavix should be held at this time due to risk of bleeding.  Plan to follow-up CBC in a.m.  Await vascular follow-up and plan of care.

## 2019-10-15 NOTE — Progress Notes (Signed)
Speech Language Pathology Daily Session Note  Patient Details  Name: [name] MRN: [id number] Date of Birth: 01-21-1929  Today's Date: 10/15/2019 SLP Individual Time: 7121-9758 SLP Individual Time Calculation (min): 42 min  Short Term Goals: Week 2: SLP Short Term Goal 1 (Week 2): Pt will demonstrate sustained attention in 30 minute intervals with min A verbal cues during mildly complex tasks. SLP Short Term Goal 2 (Week 2): Pt will demonstrate mildly complex problem solving skills with min A verbal cues. SLP Short Term Goal 3 (Week 2): Pt will demonstrate self-monitoring and self-correcting of functional errors in problem solving tasks with min A verbal cues. SLP Short Term Goal 4 (Week 2): Pt will demonstrate recall of daily information (including speech and swallow strategies) with min A verbal cues for visual aids. SLP Short Term Goal 5 (Week 2): Pt will utilize swallow strategies when consuming dys 3 textures and thin liquids via cup with minimal overt s/s aspiration and supervision A verbal cues. SLP Short Term Goal 6 (Week 2): Pt will demonstrate 85% intelligibility at sentence level with supervision A verbal cues to increase vocal intensity in a mildly nosiy environment..  Skilled Therapeutic Interventions:Skilled ST services focused on swallow and cognitive skills. SLP communicated with nurse prior to treatment session about medical changes. Nurse noted overt coughing on thin liquids and providing only nectar thick liquids at bedside. Pt requested a coke, SLP repositioned pt to 50 degrees and provided thin liquids (coke) via TSP. Pt demonstrated secondary swallow with no overt s/s aspiration consuming a total of x7 TSP. Pt required initial max A verbal cues to recall swallow precautions, following visualization and repetition pt demonstrated ability to recall precautions with min A verbal cues. Pt demonstrated little recall of recent medical changes and required max A verbal cues to  recall blood transfusion yesterday. SLP notified nursing to allow thin liquids vis TSP when pt is in a safe position. Pt was left in room with call bell within reach and bed alarm set. SLP recommends to continue skilled services.     Pain Pain Assessment Pain Score: 0-No pain  Therapy/Group: Individual Therapy  Gloris Shiroma  [facility] 10/15/2019, 4:05 PM

## 2019-10-15 NOTE — Progress Notes (Signed)
Brief Note:  Please see progress note from earlier as well as PA note.  Later in the morning, pt complained of abdominal pain.  CT abdomen ordered, discussed/reviewed with radiologist, noted to be significant for multiple sites of new hematoma formation, including possible spontaneous aortic bleed. Vascular surgery consulted. ASA/Plavix held. Repeat labs ordered. Will follow up with general surgery as well.

## 2019-10-15 NOTE — Progress Notes (Signed)
[facility] PHYSICAL MEDICINE & REHABILITATION PROGRESS NOTE  Subjective/Complaints: Patient seen sitting up in bed this morning.  She states she slept well overnight.  She states she does not feel any different after transfusion yesterday.  ROS: Denies CP, SOB, N/V/D  Objective: Vital Signs: Blood pressure 116/61, pulse 83, temperature 97.6 F (36.4 C), temperature source Oral, resp. rate 18, height 5\' 2"  (1.575 m), weight 43.6 kg, SpO2 96 %. DG CHEST PORT 1 VIEW  Result Date: 10/13/2019 CLINICAL DATA:  Leukocytosis EXAM: PORTABLE CHEST 1 VIEW COMPARISON:  February 07, 2016 FINDINGS: The cardiomediastinal silhouette is unchanged in contour.Tortuous thoracic aorta. Atherosclerotic calcifications of the aorta. No pleural effusion. No pneumothorax. New LEFT basilar linear opacity, most likely atelectasis. Surgical clips project over the RIGHT upper quadrant. Multilevel degenerative changes of the thoracic spine. IMPRESSION: New LEFT basilar linear opacity, most likely atelectasis. Infection remains in the differential. Electronically Signed   By: Meda Klinefelter MD   On: 10/13/2019 17:05   Recent Labs    10/14/19 0954 10/15/19 0429  WBC 12.3* 10.7*  HGB 5.3* 10.2*  HCT 17.1* 30.9*  PLT 426* 410*   Recent Labs    10/13/19 0912 10/14/19 0700  NA 132* 133*  K 5.1 4.6  CL 97* 99  CO2 25 22  GLUCOSE 212* 112*  BUN 36* 45*  CREATININE 1.27* 1.84*  CALCIUM 9.2 8.8*    Intake/Output Summary (Last 24 hours) at 10/15/2019 0849 Last data filed at 10/14/2019 1930 Gross per 24 hour  Intake 783 ml  Output --  Net 783 ml        Physical Exam: BP 116/61 (BP Location: Left Arm)   Pulse 83   Temp 97.6 F (36.4 C) (Oral)   Resp 18   Ht 5\' 2"  (1.575 m)   Wt 43.6 kg   SpO2 96%   BMI 17.58 kg/m  Constitutional: No distress . Vital signs reviewed. HENT: Normocephalic.  Atraumatic. Eyes: EOMI. No discharge. Cardiovascular: No JVD.  RRR. Respiratory: Normal effort.  No stridor.   Bilateral clear to auscultation. GI: Non-distended.  BS +. Skin: Warm and dry.  Neck incision healing. Psych: Flat.  Somewhat slowed. Musc:  Right fourth digit PIP flexion contracture Kyphosis Neuro: Alert and oriented Mild dysarthria, unchanged Motor: RUE: Shoulder abduction 2+/5, elbow flexion/extension 2+/5, handgrip 2+/5, unchanged Right lower extremity: Hip flexion, knee extension 4-/5, ankle dorsiflexion 4+/5  Assessment/Plan: 1. Functional deficits secondary to left frontoparietal infarct which require 3+ hours per day of interdisciplinary therapy in a comprehensive inpatient rehab setting.  Physiatrist is providing close team supervision and 24 hour management of active medical problems listed below.  Physiatrist and rehab team continue to assess barriers to discharge/monitor patient progress toward functional and medical goals   Care Tool:  Bathing  Bathing activity did not occur: Refused Body parts bathed by patient: Chest, Abdomen, Front perineal area, Right upper leg, Left upper leg, Face   Body parts bathed by helper: Right arm, Left arm, Buttocks, Right lower leg, Left lower leg     Bathing assist Assist Level: Maximal Assistance - Patient 24 - 49%     Upper Body Dressing/Undressing Upper body dressing   What is the patient wearing?: Pull over shirt    Upper body assist Assist Level: Maximal Assistance - Patient 25 - 49%    Lower Body Dressing/Undressing Lower body dressing      What is the patient wearing?: Pants, Incontinence brief     Lower body assist Assist for  lower body dressing: Total Assistance - Patient < 25%     Toileting Toileting    Toileting assist Assist for toileting: Total Assistance - Patient < 25%     Transfers Chair/bed transfer  Transfers assist  Chair/bed transfer activity did not occur: Safety/medical concerns  Chair/bed transfer assist level: Maximal Assistance - Patient 25 - 49%      Locomotion Ambulation   Ambulation assist   Ambulation activity did not occur: Safety/medical concerns (R hemi, fatigue,generalized weakness, decresed balance/postural control)          Walk 10 feet activity   Assist  Walk 10 feet activity did not occur: Safety/medical concerns (R hemi, fatigue,generalized weakness, decresed balance/postural control)        Walk 50 feet activity   Assist Walk 50 feet with 2 turns activity did not occur: Safety/medical concerns (R hemi, fatigue,generalized weakness, decresed balance/postural control)         Walk 150 feet activity   Assist Walk 150 feet activity did not occur: Safety/medical concerns (R hemi, fatigue,generalized weakness, decresed balance/postural control)         Walk 10 feet on uneven surface  activity   Assist Walk 10 feet on uneven surfaces activity did not occur: Safety/medical concerns (R hemi, fatigue,generalized weakness, decresed balance/postural control)         Wheelchair     Assist Will patient use wheelchair at discharge?: Yes Type of Wheelchair: Manual    Wheelchair assist level: Moderate Assistance - Patient 50 - 74% Max wheelchair distance: 71ft    Wheelchair 50 feet with 2 turns activity    Assist        Assist Level: Total Assistance - Patient < 25%   Wheelchair 150 feet activity     Assist      Assist Level: Dependent - Patient 0%    Medical Problem List and Plan: 1.  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 Pt s/p left CEA.  Continue CIR 2.  Antithrombotics: -DVT/anticoagulation:  Pharmaceutical:  Heparin DC'd due to hemoglobin             -antiplatelet therapy: on ASA 81 (changed after discussion with pharmacy)/Plavix 75 mg daily.  3. Pain Management:   As needed medications 4. Mood: LCSW to follow for evaluation and support.              -antipsychotic agents: N/A 5.  Neuropsych: This patient is capable of making decisions on her own behalf. 6. Skin/Wound Care: Monitor incision for healing. Routine pressure relief measures.  7. Fluids/Electrolytes/Nutrition: Monitor I/Os.  8. HTN:   Inderal 60 mg bid  Monitor with increased mobility  Labile on 10/12, monitor trend 9.  AKI on CKD IIIa/b: Encourage fluid intake.   Creatinine 1.84 on 10/11, labs ordered for tomorrow  Echo reviewed, EF 70-7 5%, IVF nightly started on 10/11  Continue to monitor 10. ABLA:   Hemoglobin 10.2 on 10/12  Transfuse 2 units of PRBCs on 10/11  Hemoccult ordered-remains pending  Continue to monitor 11. Anxiety/depression:  On valium bid to help manage anxiety state. Well controlled with medication.   Fluoxetine DC'd due to Plavix 12. IBS--diarrhea: Questran on hold 13.  Hyponatremia  Sodium 133 on 10/11, labs ordered for tomorrow  Continue to monitor 14.  Severe hypoalbuminemia  Supplement initiated on 10/5 15. BMI 16.77- dietary consult for calorie count, assess nutritional needs.  16.  Post stroke dysphagia  D3 thins  Advance  diet as tolerated  17.  Leukocytosis-acute lower UTI +/- PNA  WBC 10.7 on 10/12  Afebrile  UA equivocal, urine culture showing E. coli  Chest x-ray personally reviewed, showing left opacity,?  Atelectasis vs PNA  Continue IV cefepime    LOS: 8 days A FACE TO FACE EVALUATION WAS PERFORMED  Kesi Perrow Karis Juba 10/15/2019, 8:49 AM

## 2019-10-15 NOTE — Consult Note (Signed)
REASON FOR CONSULT:    Retroperitoneal bleed.  The consult is requested by Dr. Allena Katz.   ASSESSMENT & PLAN:   LEFT RECTUS HEMATOMA POSSIBLE RETROPERITONEAL HEMATOMA: This patient has a large left rectus hematoma.  She said no invasive procedures and this could be a spontaneous hematoma.  She was on aspirin and Plavix which has been held.  She had also been getting subcu heparin an I suppose there is a small chance that this could have resulted in a rectus hematoma given her frail state.  There is no evidence of aneurysmal disease on her CT scan.  She is 86 years old and quite frail and certainly I would not recommend an aggressive approach to this.  She has significant renal insufficiency and I would not repeat her CT scan with contrast unless she became unstable.  Currently she has minimal symptoms and is hemodynamically stable.    Her aspirin and Plavix has been held.  It would be nice to restart her 81 mg of aspirin once her hemoglobin stable given her recent carotid endarterectomy.  With respect to her physical therapy I think that rehab will have to go quite slow with her.  We will follow peripherally.   Waverly Ferrari, MD Office: [phone]   HPI:   Elizabeth Tapia is a pleasant 86 y.o. female, who was admitted on 10/07/2019 with dysarthria and right hemiparesis.  I saw her in consultation on 10/01/2019 with a symptomatic left carotid stenosis.  On 10/04/2019 she underwent left carotid endarterectomy with bovine pericardial patch angioplasty.  She was doing well from our standpoint on postoperative day #1 and at that point we had signed off.  She was transferred to rehab and was having some abdominal pain today.  This prompted a CT scan which showed evidence of a retroperitoneal hematoma.  For this reason vascular surgery was consulted.  In reviewing her records it does not look like she has had any femoral access.  On my history the patient is not aware of any procedures involving  femoral access.  She has been getting subcu heparin according to the nurses.  She developed some vague abdominal pain today which prompted her CT scan.  Currently she is really asymptomatic except for palpation of her abdomen.  Past Medical History:  Diagnosis Date  . Anxiety   . CKD (chronic kidney disease)   . Depression   . Essential tremor   . HTN (hypertension)   . IBS (irritable bowel syndrome)    with diarrhea    Family History  Problem Relation Age of Onset  . Alcohol abuse Father   . Alcohol abuse Sister   . Alcohol abuse Brother     SOCIAL HISTORY: Social History   Socioeconomic History  . Marital status: Unknown    Spouse name: Not on file  . Number of children: Not on file  . Years of education: Not on file  . Highest education level: Not on file  Occupational History  . Not on file  Tobacco Use  . Smoking status: Never Smoker  . Smokeless tobacco: Never Used  Substance and Sexual Activity  . Alcohol use: Not on file  . Drug use: Not on file  . Sexual activity: Not on file  Other Topics Concern  . Not on file  Social History Narrative  . Not on file   Social Determinants of Health   Financial Resource Strain:   . Difficulty of Paying Living Expenses: Not on file  Food Insecurity:   .  Worried About Programme researcher, broadcasting/film/video in the Last Year: Not on file  . Ran Out of Food in the Last Year: Not on file  Transportation Needs:   . Lack of Transportation (Medical): Not on file  . Lack of Transportation (Non-Medical): Not on file  Physical Activity:   . Days of Exercise per Week: Not on file  . Minutes of Exercise per Session: Not on file  Stress:   . Feeling of Stress : Not on file  Social Connections:   . Frequency of Communication with Friends and Family: Not on file  . Frequency of Social Gatherings with Friends and Family: Not on file  . Attends Religious Services: Not on file  . Active Member of Clubs or Organizations: Not on file  . Attends Occupational hygienist Meetings: Not on file  . Marital Status: Not on file  Intimate Partner Violence:   . Fear of Current or Ex-Partner: Not on file  . Emotionally Abused: Not on file  . Physically Abused: Not on file  . Sexually Abused: Not on file    No Known Allergies  Current Facility-Administered Medications  Medication Dose Route Frequency Provider Last Rate Last Admin  . (feeding supplement) PROSource Plus liquid 30 mL  30 mL Oral BID BM Marcello Fennel, MD   30 mL at 10/13/19 1703  . 0.9 %  sodium chloride infusion   Intravenous Continuous Marcello Fennel, MD   Stopped at 10/15/19 [phone]  . acetaminophen (TYLENOL) tablet 325-650 mg  325-650 mg Oral Q4H PRN Jacquelynn Cree, PA-C   650 mg at 10/15/19 0836  . alum & mag hydroxide-simeth (MAALOX/MYLANTA) 200-200-20 MG/5ML suspension 30 mL  30 mL Oral Q4H PRN Love, Pamela S, PA-C      . atorvastatin (LIPITOR) tablet 80 mg  80 mg Oral Daily Jacquelynn Cree, PA-C   80 mg at 10/15/19 9702  . bisacodyl (DULCOLAX) suppository 10 mg  10 mg Rectal Daily PRN Dereck Ligas, RPH      . ceFEPIme (MAXIPIME) 2 g in sodium chloride 0.9 % 100 mL IVPB  2 g Intravenous Q24H Hammons, Kimberly B, RPH 200 mL/hr at 10/14/19 2259 2 g at 10/14/19 2259  . diazepam (VALIUM) tablet 2 mg  2 mg Oral q12n4p Jacquelynn Cree, PA-C   2 mg at 10/15/19 6378  . diphenhydrAMINE (BENADRYL) 12.5 MG/5ML elixir 12.5-25 mg  12.5-25 mg Oral Q6H PRN Love, Pamela S, PA-C      . feeding supplement (ENSURE ENLIVE) (ENSURE ENLIVE) liquid 237 mL  237 mL Oral BID BM Hammons, Kimberly B, RPH   237 mL at 10/12/19 1550  . FLUoxetine (PROZAC) capsule 10 mg  10 mg Oral Daily Jacquelynn Cree, PA-C   10 mg at 10/15/19 5885  . guaiFENesin-dextromethorphan (ROBITUSSIN DM) 100-10 MG/5ML syrup 5-10 mL  5-10 mL Oral Q6H PRN Love, Pamela S, PA-C      . iohexol (OMNIPAQUE) 9 MG/ML oral solution           . pantoprazole (PROTONIX) EC tablet 40 mg  40 mg Oral Daily Jacquelynn Cree, PA-C   40 mg at 10/15/19  0277  . phenol (CHLORASEPTIC) mouth spray 1 spray  1 spray Mouth/Throat PRN Love, Pamela S, PA-C      . polyethylene glycol (MIRALAX / GLYCOLAX) packet 17 g  17 g Oral Daily PRN Love, Pamela S, PA-C      . prochlorperazine (COMPAZINE) tablet 5-10 mg  5-10 mg Oral  Q6H PRN Jacquelynn Cree, PA-C       Or  . prochlorperazine (COMPAZINE) injection 5-10 mg  5-10 mg Intramuscular Q6H PRN Love, Pamela S, PA-C       Or  . prochlorperazine (COMPAZINE) suppository 12.5 mg  12.5 mg Rectal Q6H PRN Love, Pamela S, PA-C      . propranolol (INDERAL) tablet 60 mg  60 mg Oral BID Delle Reining S, PA-C   60 mg at 10/15/19 9518  . sodium phosphate (FLEET) 7-19 GM/118ML enema 1 enema  1 enema Rectal Once PRN Love, Pamela S, PA-C      . sorbitol 70 % solution 30 mL  30 mL Oral Daily PRN Lovorn, Megan, MD   30 mL at 10/13/19 1356  . traMADol (ULTRAM) tablet 50 mg  50 mg Oral Q6H PRN Jacquelynn Cree, PA-C   50 mg at 10/15/19 0223  . traZODone (DESYREL) tablet 25-50 mg  25-50 mg Oral QHS PRN Love, Pamela S, PA-C        REVIEW OF SYSTEMS:  [X]  denotes positive finding, [ ]  denotes negative finding Cardiac  Comments:  Chest pain or chest pressure:    Shortness of breath upon exertion:    Short of breath when lying flat:    Irregular heart rhythm:        Vascular    Pain in calf, thigh, or hip brought on by ambulation:    Pain in feet at night that wakes you up from your sleep:     Blood clot in your veins:    Leg swelling:         Pulmonary    Oxygen at home:    Productive cough:     Wheezing:         Neurologic    Sudden weakness in arms or legs:  x   Sudden numbness in arms or legs:  x   Sudden onset of difficulty speaking or slurred speech:    Temporary loss of vision in one eye:     Problems with dizziness:         Gastrointestinal    Blood in stool:     Vomited blood:         Genitourinary    Burning when urinating:     Blood in urine:        Psychiatric    Major depression:           Hematologic    Bleeding problems:    Problems with blood clotting too easily:        Skin    Rashes or ulcers:        Constitutional    Fever or chills:     PHYSICAL EXAM:   Vitals:   10/14/19 2002 10/14/19 2249 10/15/19 0431 10/15/19 0433  BP: 125/61 (!) 143/63 (!) 120/51 116/61  Pulse: 85 88 83 83  Resp: 16  18   Temp: 98.4 F (36.9 C)  97.6 F (36.4 C)   TempSrc:   Oral   SpO2: 98%  95% 96%  Weight:    43.6 kg  Height:        GENERAL: The patient is a well-nourished female, in no acute distress. The vital signs are documented above. CARDIAC: There is a regular rate and rhythm.  VASCULAR: I do not detect carotid bruits. Her left neck incision is healing nicely. She has persistent right upper extremity weakness. She has palpable femoral, popliteal, and pedal pulses bilaterally. PULMONARY: There  is good air exchange bilaterally without wheezing or rales. ABDOMEN: Soft and non-tender with normal pitched bowel sounds.  MUSCULOSKELETAL: There are no major deformities or cyanosis. NEUROLOGIC: No focal weakness or paresthesias are detected. SKIN: There are no ulcers or rashes noted. PSYCHIATRIC: The patient has a normal affect.  DATA:    CT ABDOMEN PELVIS: I have reviewed the images of her CT abdomen and pelvis that was done today.  It was noted to be a large left lower rectus sheath hematoma with hematoma tracking along the left pelvic sidewall.  There was also some evidence of retroperitoneal hematoma.  Of note, the CT scan was done without contrast and so I am unable to really assess the aorta.  It does not however look aneurysmal.  LABS: Her hemoglobin on 1011 was 5.3.  She was transfused and her H&H is now 10.2/30.9.  Platelets 410,000.  White blood cell count 10.7.  Her INR on 10/01/2019 was normal (1.0).  Her PTT on 10/01/2019 was normal (25)  Her GFR on 10/14/2019 was 24.  This is down from 38 yesterday.  Creatinine 1.84.

## 2019-10-15 NOTE — Progress Notes (Signed)
Patient complaining of abdominal pain rated 6 out of 10. Pain medication given. Jesusita Oka, PA notified and new orders were obtained. Patient is back in best resting at this time.

## 2019-10-15 NOTE — Progress Notes (Signed)
Physical Therapy Session Note  Patient Details  Name: Elizabeth Tapia MRN: [id number] Date of Birth: [birth year]  Pt missed 45 minutes of skilled PT session due to complaints of severe abdominal pain and "feeling unwell." Spoke with RN who agrees to defer therapy at this time.  Therapy Documentation Precautions:  Precautions Precautions: Fall Precaution Comments: R hemiparesis Restrictions Weight Bearing Restrictions: No General: PT Amount of Missed Time (min): 45 Minutes PT Missed Treatment Reason: Patient fatigue;Other (Comment) (abdominal pain with pending CT)  [name] [name] PT 10/15/2019, 12:13 PM

## 2019-10-15 NOTE — Progress Notes (Signed)
Pt c/o 10/10 LLQ pain, increased pain with palpation. Pt offered pain interventions, but refused stating " I think I will be ok". Pt was repositioned on her right side. Call light in reach.

## 2019-10-15 NOTE — Progress Notes (Signed)
In to assist therapy this AM and noticed pt IV left forearm site infiltrated. Nurse notified. IV removed. Pt denies pain at site, pulses palpable.  Mylo Red, LPN

## 2019-10-15 NOTE — Progress Notes (Signed)
Physical Therapy Weekly Progress Note  Patient Details  Name: Elizabeth Tapia MRN: [id number] Date of Birth: April 13, 1929  Beginning of progress report period: October 08, 2019 End of progress report period: October 15, 2019  Today's Date: 10/15/2019 PT Individual Time: 6256-3893 PT Individual Time Calculation (min): 24 min   Patient has met 0 of 3 short term goals. Pt demonstrates minimal progress towards long term goals. Pt currently requires mod/max A for bed mobility, max A for transfers with and without AD, max A to ambulate 100ft inside parallel bars, and mod A for WC mobility up to 73ft. Pt continues to be limited by severe trunk flexion/kyphosis, posterior lean in standing, decreased motor control/planning/sequencing, and decreased safety awareness. Pt recently limited in therapy due to need for blood transfusion and new hematoma and possible aortic leak.   Patient continues to demonstrate the following deficits muscle weakness, decreased coordination and decreased motor planning, decreased awareness, decreased problem solving, decreased safety awareness and decreased memory and decreased sitting balance, decreased standing balance, decreased postural control, hemiplegia and decreased balance strategies and therefore will continue to benefit from skilled PT intervention to increase functional independence with mobility.  Patient progressing toward long term goals..  Continue plan of care.  PT Short Term Goals Week 1:  PT Short Term Goal 1 (Week 1): Pt will perform bed mobility with supervision PT Short Term Goal 1 - Progress (Week 1): Progressing toward goal PT Short Term Goal 2 (Week 1): Pt wil transfer sit<>stand with LRAD min A PT Short Term Goal 2 - Progress (Week 1): Progressing toward goal PT Short Term Goal 3 (Week 1): Pt will perform simulated car transfer with LRAD mod A PT Short Term Goal 3 - Progress (Week 1): Progressing toward goal Week 2:  PT Short Term Goal 1 (Week 2): Pt  will perform bed mobility with supervision consistantly PT Short Term Goal 2 (Week 2): Pt wil transfer sit<>stand with LRAD mod A consistantly PT Short Term Goal 3 (Week 2): Pt will perform simulated car transfer with LRAD mod A  Skilled Therapeutic Interventions/Progress Updates:  Ambulation/gait training;Discharge planning;Functional mobility training;Psychosocial support;Therapeutic Activities;Balance/vestibular training;Disease management/prevention;Neuromuscular re-education;Skin care/wound management;Therapeutic Exercise;Wheelchair propulsion/positioning;Cognitive remediation/compensation;DME/adaptive equipment instruction;Pain management;Splinting/orthotics;UE/LE Strength taining/ROM;Community reintegration;Functional electrical stimulation;Patient/family education;Stair training;UE/LE Coordination activities   Today's Interventions: Received pt supine in bed asleep, upon wakening pt agreeable to therapy, and denied any pain at rest but c/o increased L abdominal pain with mobility. Session with emphasis on functional mobility/transfers, generalized strengthening, dynamic sitting balance/coordination, and improved activity tolerance. Pt transferred supine<>sitting EOB with HOB elevated and use of bedrails with mod A. Pt required total A to scoot hips EOB and demonstrated R lateral and posterior lean requiring max A fading to close supervision to maintain balance as well as cues to promote L weight shifting and midline orientation. Upon sitting EOB pt reported dizziness. BP 129/60 and HR 74bpm. RN and PA present during session stating pt still able to participate in therapy but recommending pt not get OOB and to "go easy" due to hematoma and possible aortic leak. Pt transferred sit<>supine with max A and required max A to scoot to [facility]. Concluded session with pt supine in bed, needs within reach, and bed alarm on. Pt asleep before therapist left room.   Therapy Documentation Precautions:   Precautions Precautions: Fall Precaution Comments: R hemiparesis Restrictions Weight Bearing Restrictions: No  Therapy/Group: Individual Therapy Alfonse Alpers PT, DPT   10/15/2019, 7:25 AM

## 2019-10-16 ENCOUNTER — Inpatient Hospital Stay (HOSPITAL_COMMUNITY): Payer: Medicare Other | Admitting: Speech Pathology

## 2019-10-16 ENCOUNTER — Inpatient Hospital Stay (HOSPITAL_COMMUNITY): Payer: Medicare Other

## 2019-10-16 ENCOUNTER — Inpatient Hospital Stay (HOSPITAL_COMMUNITY): Payer: Medicare Other | Admitting: Occupational Therapy

## 2019-10-16 LAB — BASIC METABOLIC PANEL
Anion gap: 11 (ref 5–15)
BUN: 29 mg/dL — ABNORMAL HIGH (ref 8–23)
CO2: 23 mmol/L (ref 22–32)
Calcium: 8.7 mg/dL — ABNORMAL LOW (ref 8.9–10.3)
Chloride: 102 mmol/L (ref 98–111)
Creatinine, Ser: 1.41 mg/dL — ABNORMAL HIGH (ref 0.44–1.00)
GFR, Estimated: 33 mL/min — ABNORMAL LOW (ref >60)
Glucose, Bld: 96 mg/dL (ref 70–99)
Potassium: 4.1 mmol/L (ref 3.5–5.1)
Sodium: 136 mmol/L (ref 135–145)

## 2019-10-16 LAB — CBC
HCT: 31.7 % — ABNORMAL LOW (ref 36.0–46.0)
Hemoglobin: 10.2 g/dL — ABNORMAL LOW (ref 12.0–15.0)
MCH: 29.5 pg (ref 26.0–34.0)
MCHC: 32.2 g/dL (ref 30.0–36.0)
MCV: 91.6 fL (ref 80.0–100.0)
Platelets: 447 10*3/uL — ABNORMAL HIGH (ref 150–400)
RBC: 3.46 MIL/uL — ABNORMAL LOW (ref 3.87–5.11)
RDW: 14.6 % (ref 11.5–15.5)
WBC: 9.5 10*3/uL (ref 4.0–10.5)
nRBC: 0 % (ref 0.0–0.2)

## 2019-10-16 NOTE — Progress Notes (Signed)
Physical Therapy Session Note  Patient Details  Name: Elizabeth Tapia MRN: [id number] Date of Birth: 03-15-1929  Today's Date: 10/16/2019 PT Individual Time: 6440-3474 and 2595-6387  PT Individual Time Calculation (min): 49 min and 42 min Today's Date: 10/16/2019 PT Missed Time: 11 Minutes Missed Time Reason: Patient fatigue  Short Term Goals: Week 1:  PT Short Term Goal 1 (Week 1): Pt will perform bed mobility with supervision PT Short Term Goal 1 - Progress (Week 1): Progressing toward goal PT Short Term Goal 2 (Week 1): Pt wil transfer sit<>stand with LRAD min A PT Short Term Goal 2 - Progress (Week 1): Progressing toward goal PT Short Term Goal 3 (Week 1): Pt will perform simulated car transfer with LRAD mod A PT Short Term Goal 3 - Progress (Week 1): Progressing toward goal Week 2:  PT Short Term Goal 1 (Week 2): Pt will perform bed mobility with supervision consistantly PT Short Term Goal 2 (Week 2): Pt wil transfer sit<>stand with LRAD mod A consistantly PT Short Term Goal 3 (Week 2): Pt will perform simulated car transfer with LRAD mod A  Skilled Therapeutic Interventions/Progress Updates:   Treatment Session 1: 1030-1119 49 min Received pt supine in bed with son present at bedside, pt agreeable to therapy, and denied any pain at rest but reported increased abdominal pain with mobility from hematoma. Per PA and MD, pt okay to participate in light therapy. Donned bilateral ted hose and non-skid socks total A and pants with max A. Pt rolled L and R with mod A and use of bedrails and required total A to pull pants over hips. Pt transferred supine<>sitting EOB with HOB elevated and use of bedrails with mod/max A and cues for logroll techniuqe. Pt reported increased abdominal pain transitioning supine<>sitting EOB. Pt required mod A fading to CGA to maintain static sitting balance and required cues for anterior and L lateral weight shifting. Pt required max A to scoot hips EOB. Doffed gown  and donned button down shirt with max A. Pt transferred bed<>WC stand<>pivot without AD and total A. Pt required cues for hand placement, foot placement, anterior weight shifting, and upright posture however pt with poor carry over and getting feet tangled mid-transfer. Pt with significant posterior lean and trunk flexion. Pt brushed teeth sitting in WC at sink with min A and cues to swish water in mouth but to avoid swallowing as pt now on Dysphagia 1 diet. Pt's son with questions regarding pt's current swallow status and diet change. Therapist explained use of thickener and food consistency but encouraged pt's son to speak further with speech therapy regarding concerns. Pt's son reported there is 1 STE at home and he plans on picking pt up in the wheelchair to navigate step. Therapist encouraged bumping up/down step instead of carrying WC for safety and explained how to bump up/down step in WC. Plans to do family education and practice this once pt's stamina improves. Pt transferred sit<>stand at sink with max/mod A with cues for hand placement on WC armrests, foot placement, anterior weight shifting, and upright posture using mirror for visual feedback. Pt able to remain standing approximately 10 seconds prior to reporting increased abdominal pain and requesting to sit. Pt declined any further standing. Pt's son donned R wrist splint with minimal cues. Concluded session with pt sitting in WC, needs within reach, and seatbelt alarm on. 11 minutes missed of skilled physical therapy due to fatigue.   Treatment Session 2: 1445-1527 42 min Received pt supine  in bed asleep, upon awakening pt agreeable to therapy, and denied any pain during session but reported fatigue and frequently yawning and closing eyes throughout session. Session with emphasis on bed mobility, generalized strengthening, and improved activity tolerance. Pt performed the following exercises supine in bed with supervision and verbal cues for  technique and sequencing: -heel slides 2x8 bilaterally -hip abduction 2x8 bilaterally  -hip adduction pillow squeezes 2x10  -SLR 2x8 bilaterally -bridges 2x8 Pt rolled L and R with min A and use of bedrails and performed clamshells 2x8 bilaterally. Noted spontaneous tremors in jaw during session; pt unaware. Provided pt with 5 sips of nectar thick water and 2 sips of orange juice with verbal cues to "swallow hard", no coughing noted. Pt reported forgetting to drink throughout the day and therapist encouraged hydration as much as possible and provided additional nectar thick orange juice and water. Concluded session with pt supine in bed, needs within reach, and bed alarm on.   Therapy Documentation Precautions:  Precautions Precautions: Fall Precaution Comments: R hemiparesis Restrictions Weight Bearing Restrictions: No  Therapy/Group: Individual Therapy Martin Majestic PT, DPT   10/16/2019, 7:31 AM

## 2019-10-16 NOTE — Progress Notes (Signed)
Speech Language Pathology Daily Session Note  Patient Details  Name: Krystian Ferrentino MRN: [id number] Date of Birth: Aug 01, 1929  Today's Date: 10/16/2019 SLP Individual Time: 0930-1030 SLP Individual Time Calculation (min): 60 min  Short Term Goals: Week 2: SLP Short Term Goal 1 (Week 2): Pt will demonstrate sustained attention in 30 minute intervals with min A verbal cues during mildly complex tasks. SLP Short Term Goal 2 (Week 2): Pt will demonstrate mildly complex problem solving skills with min A verbal cues. SLP Short Term Goal 3 (Week 2): Pt will demonstrate self-monitoring and self-correcting of functional errors in problem solving tasks with min A verbal cues. SLP Short Term Goal 4 (Week 2): Pt will demonstrate recall of daily information (including speech and swallow strategies) with min A verbal cues for visual aids. SLP Short Term Goal 5 (Week 2): Pt will utilize swallow strategies when consuming dys 1 textures and nectar-thick liquids with minimal overt s/s aspiration and Min A verbal cues. SLP Short Term Goal 5 - Progress (Week 2): Revised due to lack of progress SLP Short Term Goal 6 (Week 2): Pt will demonstrate 85% intelligibility at sentence level with supervision A verbal cues to increase vocal intensity in a mildly nosiy environment..  Skilled Therapeutic Interventions: Skilled treatment session focused on dysphagia and cognitive goals. Upon arrival, nursing staff reported increased overt s/s of aspiration with thin liquids. Patient consumed thin liquids via tsp with constant and prolonged coughing. Coughing was eliminated with trials of nectar-thick liquids via cup or tsp. However, delayed and intermittent throat clearing was noted, suspect due to questionable reflux. Patient also consumed Dys. 1 textures with increased efficiency of AP transit and increased ability to utilize a "hard swallow."  Therefore, recommend patent downgrade to Dys. 1 textures with nectar-thick liquids.  Patient's son present and provided education on current recommendations and clinical reasoning, he verbalized understanding and agreement. Patient was oriented to place but required total A for orientation to situation and month. Patient appeared lethargic throughout the session but sustained attention to tasks for ~15 minute intervals. Patient left upright in bed with alarm on and all needs within reach. Continue with current plan of care.      Pain No/Denies Pain   Therapy/Group: Individual Therapy  Alejos Reinhardt 10/16/2019, 11:54 AM

## 2019-10-16 NOTE — Patient Care Conference (Signed)
Inpatient RehabilitationTeam Conference and Plan of Care Update Date: 10/16/2019   Time: 11:33 AM    Patient Name: Elizabeth Tapia      Medical Record Number: [id number]  Date of Birth: [birth year] Sex: Female         Room/Bed: 4M03C/4M03C-01 Payor Info: Payor: MEDICARE / Plan: MEDICARE PART A AND B / Product Type: *No Product type* /    Admit Date/Time:  10/07/2019  4:56 PM  Primary Diagnosis:  Cerebral infarction involving left carotid artery [facility])  Hospital Problems: Principal Problem:   Cerebral infarction involving left carotid artery (HCC) Active Problems:   Hypoalbuminemia due to protein-calorie malnutrition (HCC)   Hyponatremia   Acute blood loss anemia   Essential hypertension   Right hemiparesis (HCC)   Labile blood pressure   Protein-calorie malnutrition, severe   Dysphagia, post-stroke   Leukocytosis   AKI (acute kidney injury) (HCC)   History of hypertension   Acute lower UTI   Hematoma    Expected Discharge Date: Expected Discharge Date: 10/29/19  Team Members Present: Physician leading conference: Dr. Maryla Morrow Care Coodinator Present: Chana Bode, RN, BSN, CRRN;Other (comment) Kriste Basque Dupree, SW) Nurse Present: Other (comment) Lupita Dawn, RN) PT Present: Raechel Chute, PT OT Present: Towanda Malkin, OT SLP Present: Feliberto Gottron, SLP PPS Coordinator present : Fae Pippin, [place], PT     Current Status/Progress Goal Weekly Team Focus  Bowel/Bladder   Pt is incontinent of b/b, LBM 10/15/19  Regain continence of b/b.  Q2h toileting/ PRn   Swallow/Nutrition/ Hydration   Dys. 1 textures with nectar-thick liquids, Mod-Max A for use of swallow strategies  Supervision A  tolerance of current diet   ADL's   max-total assist squat pivot transfers, max assist sit > stand.  Max assist bathing and UB dressing, total assist to total +2 LB dressing.  Pt with decreased activity tolerance, even questionable hypotension during OT session 10/12  Min  assist  ADL retraining, postural control, transfers, sit > stand, RUE NMR   Mobility   bed mobility supervision/min A, transfers max A with and without AD, gait 5ft inside parallel bars max A, and WC mobility 25ft mod A  supervision/CGA  functional mobility/transfers, generalized strengthening, dynamic standing balance/coordination, ambulation, NMR, improved activity tolerance.   Communication   ~90% intelligiblity at the sentence level, Mod A for use of speech strategies  Supervision A  use of speech strategies   Safety/Cognition/ Behavioral Observations  Mod-Max A  Supervision A  attention, basic problem solving   Pain   pt c/o of LLQ pain. increrased pain with movement and palpation  pt will we controlled with PRN medications  assess pain qshift   Skin   Incision to R anterior neck with skin glue. foam on kower back/sacrum for prevention.  To promote healing and prevent further skin breakdown.  assess skin qshift/prn     Discharge Planning:  Medical issues have prevented pt's participation in therapies. Minimal progress this week due to this. Plan still to go home with son and daughter in-law   Team Discussion: Hgb down; MD ordered transfusion. Report of abd pain with vascular follow up. Note 1-3 areas of bleeding however no recommendations to change interventions at present. Given recent PEA event; MD will restart ASA when cleared. Monitoring for UTI and +/- PNA; added medications for infection. IVF for elevated creat. Level 2/2 CKI/CKD. Swallowing issues addressed with bedside swallow eval; recommend MBS to re-evaluate swallowing. Patient on target to meet rehab goals: no, little progress  due to medical issues over the past week.  *See Care Plan and progress notes for long and short-term goals.   Revisions to Treatment Plan:  SLP downgraded goals and changed diet to D1  PT downgraded goals as currently mod - max assist overall and total assist for stand pivot transfers. Teaching  Needs: Transfers, toileting, medications, etc.   Current Barriers to Discharge: Decreased caregiver support and Medical stability  Possible Resolutions to Barriers: Family education with son and daughter in law     Medical Summary Current Status: Right hemiparesis and functional deficits secondary to left fronto-parietal infarct d/t left carotid artery atheroembolism. Pt s/p left CEA, now with internal bleeding  Barriers to Discharge: Medical stability;Decreased family/caregiver support;Nutrition means   Possible Resolutions to [employer] Focus: Therapies, monitor hemoglobin closely, appreciate vasc recs, abx for UTI +/- PNA, plan to restart ASA tomorrow, advance diet as tolerated, follow labs - Cr.   Continued Need for Acute Rehabilitation Level of Care: The patient requires daily medical management by a physician with specialized training in physical medicine and rehabilitation for the following reasons: Direction of a multidisciplinary physical rehabilitation program to maximize functional independence : Yes Medical management of patient stability for increased activity during participation in an intensive rehabilitation regime.: Yes Analysis of laboratory values and/or radiology reports with any subsequent need for medication adjustment and/or medical intervention. : Yes   I attest that I was present, lead the team conference, and concur with the assessment and plan of the team.   Chana Bode B 10/16/2019, 12:31 PM

## 2019-10-16 NOTE — Progress Notes (Signed)
Occupational Therapy Session Note  Patient Details  Name: Elizabeth Tapia MRN: [id number] Date of Birth: 1929/07/30  Today's Date: 10/16/2019 OT Individual Time: 1300-1345 OT Individual Time Calculation (min): 45 min    Short Term Goals: Week 2:  OT Short Term Goal 1 (Week 2): Pt will complete LB dressing with max assist at sit > stand level OT Short Term Goal 2 (Week 2): Pt will complete toilet transfers max assist OT Short Term Goal 3 (Week 2): Pt will complete bathing with mod assist at sit > stand level OT Short Term Goal 4 (Week 2): Pt will utilize RUE as gross assist during self-care tasks  Skilled Therapeutic Interventions/Progress Updates:    Patient seated in w/c, has just finished eating lunch, son present, she denies pain at this time and states that she is feeling better.  Sit pivot transfer w/c to bed surface with max A, she is able to follow directions to lean forward and attempts to assist as instructed.   Unsupported sitting with min a.  Sit to supine max A.  Rolling in bed min A to both left and right.   Able to void on bed pan - dependent for hygiene, replace brief and max A for clothing management.  Reviewed process for use of drop arm commode with son - did not attempt this session due to patient fatigue.   She states that she is too tired to return to unsupported sitting.  Completed bed level UB and LB AROM/AAROM - she consistently attempts all exercises but has difficulty keeping eyes open due to fatigue.  She remained in bed at close of session.  Bed alarm set and call bell in hand.    Therapy Documentation Precautions:  Precautions Precautions: Fall Precaution Comments: R hemiparesis Restrictions Weight Bearing Restrictions: No  Therapy/Group: Individual Therapy  Barrie Lyme 10/16/2019, 7:38 AM

## 2019-10-16 NOTE — Progress Notes (Addendum)
Elizabeth Tapia PHYSICAL MEDICINE & REHABILITATION PROGRESS NOTE  Subjective/Complaints: Patient seen sitting up in bed this morning.  She states she slept well overnight.  She states she feels a little stronger.  She notes her abdomen feels improved.  She was seen by vascular yesterday, notes reviewed-no recommendations for intervention at this time.  ROS: + Abdomin mild soreness. Denies CP, SOB, N/V/D  Objective: Vital Signs: Blood pressure (!) 121/57, pulse 77, temperature 98.2 F (36.8 C), resp. rate 16, height 5\' 2"  (1.575 m), weight 43.6 kg, SpO2 98 %. CT ABDOMEN PELVIS WO CONTRAST  Result Date: 10/15/2019 CLINICAL DATA:  Acute abdominal pain EXAM: CT ABDOMEN AND PELVIS WITHOUT CONTRAST TECHNIQUE: Multidetector CT imaging of the abdomen and pelvis was performed following the standard protocol without IV contrast. COMPARISON:  02/07/2016 and abdominal ultrasound from 02/08/2016 FINDINGS: Lower chest: Left anterior descending and aortic atherosclerosis. Trace bilateral pleural effusions. Airspace opacity along the left hemidiaphragm and in the posterior basal segment left lower lobe likely with at least some degree of volume loss. Bandlike atelectasis in the right lower lobe. Hepatobiliary: Prominent lateral segment left hepatic lobe, cannot exclude cirrhosis. Cholecystectomy. Common bile duct approximately 9 mm in diameter. Pancreas: No contour abnormality of the pancreas is identified. Spleen: Unremarkable Adrenals/Urinary Tract: No hydronephrosis or hydroureter. Displacement of the urinary bladder by pelvic hematoma. Stomach/Bowel: Cannot exclude wall thickening in the rectum. Vascular/Lymphatic: Aortoiliac atherosclerotic vascular disease. Abnormal ill definition of periaortic soft tissues for example on image 41/3 could be from acute retroperitoneal hematoma or less likely confluent retroperitoneal adenopathy. The suspected retroperitoneal hematoma tracks along the proximal iliac vessels and into  the presacral region. Reproductive: Uterus absent. Left ovary somewhat obscured by adjacent hematoma. Other: Mesenteric stranding in the abdomen and pelvis. Musculoskeletal: Lower left rectus sheath hematoma, about 9.7 by 5.8 by 14.6 cm (volume = 430 cm^3), with adjacent hematoma tracking in the left pelvic sidewall and along the left obturator internus, and with mixed density hematoma in the space of Retzius displacing the bladder posteriorly into the right side. This additional hematoma along the pelvic sidewall and space of Retzius measures about 10.2 by 5.6 by 4.2 cm (volume = 130 cm^3). Subcutaneous edema is present particularly along the pubis tracking into the left upper thigh. No obvious masslike hematoma in the upper thigh regions. Remote compression fractures at T12 and L1. Likely late subacute fracture at T8 with about 60% loss of vertebral body height. IMPRESSION: 1. Large (430 cubic cm) left lower rectus sheath hematoma, with a 130 cubic cm hematoma tracking along the left pelvic sidewall and space of Retzius. There is also an abnormal rind of density around the abdominal aorta suspicious for retroperitoneal hematoma, acute aortic leak/hemorrhage is a distinct possibility. 2. Trace bilateral pleural effusions. Airspace opacity along the left hemidiaphragm and in the posterior basal segment left lower lobe likely with at least some degree of volume loss. 3. Remote compression fractures at T12 and L1. Likely late subacute fracture at T8 with about 60% loss of vertebral body height. 4. Prominent lateral segment left hepatic lobe, cannot exclude cirrhosis. 5. Cannot exclude wall thickening in the rectum. 6. Subcutaneous and mesenteric edema. 7. Aortic atherosclerosis. Aortic Atherosclerosis (ICD10-I70.0). Critical Value/emergent results were called by telephone at the time of interpretation on 10/15/2019 at 12:33 pm to provider Dr. 12/15/2019, who verbally acknowledged these results. Electronically Signed    By: Maryla Morrow M.D.   On: 10/15/2019 12:33   Recent Labs    10/15/19 0429 10/16/19  0606  WBC 10.7* 9.5  HGB 10.2* 10.2*  HCT 30.9* 31.7*  PLT 410* 447*   Recent Labs    10/14/19 0700 10/16/19 0606  NA 133* 136  K 4.6 4.1  CL 99 102  CO2 22 23  GLUCOSE 112* 96  BUN 45* 29*  CREATININE 1.84* 1.41*  CALCIUM 8.8* 8.7*    Intake/Output Summary (Last 24 hours) at 10/16/2019 0859 Last data filed at 10/16/2019 0819 Gross per 24 hour  Intake 70 ml  Output 1 ml  Net 69 ml        Physical Exam: BP (!) 121/57 (BP Location: Right Arm)   Pulse 77   Temp 98.2 F (36.8 C)   Resp 16   Ht 5\' 2"  (1.575 m)   Wt 43.6 kg   SpO2 98%   BMI 17.58 kg/m  Constitutional: No distress . Vital signs reviewed. HENT: Normocephalic.  Atraumatic. Eyes: EOMI. No discharge. Cardiovascular: No JVD.  RRR. Respiratory: Normal effort.  No stridor.  Bilateral clear to auscultation. GI: Non-distended.  BS +.  Mild TTP. Skin: Warm and dry.  Neck incision CDI Psych: Flat. Musc:  Right fourth digit PIP flexion contracture Kyphosis Neuro: Alert Mild dysarthria, stable Motor: RUE: Shoulder abduction 2+/5, elbow flexion/extension 2/5, handgrip 2/5 Right lower extremity: Hip flexion, knee extension 4-/5, ankle dorsiflexion 4+/5  Assessment/Plan: 1. Functional deficits secondary to left frontoparietal infarct which require 3+ hours per day of interdisciplinary therapy in a comprehensive inpatient rehab setting.  Physiatrist is providing close team supervision and 24 hour management of active medical problems listed below.  Physiatrist and rehab team continue to assess barriers to discharge/monitor patient progress toward functional and medical goals   Care Tool:  Bathing  Bathing activity did not occur: Refused Body parts bathed by patient: Right arm, Left arm, Chest, Abdomen, Face   Body parts bathed by helper: Front perineal area, Buttocks, Right upper leg, Left upper leg, Right  lower leg, Left lower leg     Bathing assist Assist Level: 2 Helpers     Upper Body Dressing/Undressing Upper body dressing   What is the patient wearing?: Button up shirt    Upper body assist Assist Level: Maximal Assistance - Patient 25 - 49%    Lower Body Dressing/Undressing Lower body dressing      What is the patient wearing?: Pants, Incontinence brief     Lower body assist Assist for lower body dressing: 2 Helpers     Toileting Toileting    Toileting assist Assist for toileting: Total Assistance - Patient < 25%     Transfers Chair/bed transfer  Transfers assist  Chair/bed transfer activity did not occur: Safety/medical concerns  Chair/bed transfer assist level: 2 Helpers     Locomotion Ambulation   Ambulation assist   Ambulation activity did not occur: Safety/medical concerns (R hemi, fatigue,generalized weakness, decresed balance/postural control)          Walk 10 feet activity   Assist  Walk 10 feet activity did not occur: Safety/medical concerns (R hemi, fatigue,generalized weakness, decresed balance/postural control)        Walk 50 feet activity   Assist Walk 50 feet with 2 turns activity did not occur: Safety/medical concerns (R hemi, fatigue,generalized weakness, decresed balance/postural control)         Walk 150 feet activity   Assist Walk 150 feet activity did not occur: Safety/medical concerns (R hemi, fatigue,generalized weakness, decresed balance/postural control)         Walk 10 feet  on uneven surface  activity   Assist Walk 10 feet on uneven surfaces activity did not occur: Safety/medical concerns (R hemi, fatigue,generalized weakness, decresed balance/postural control)         Wheelchair     Assist Will patient use wheelchair at discharge?: Yes Type of Wheelchair: Manual    Wheelchair assist level: Moderate Assistance - Patient 50 - 74% Max wheelchair distance: 76ft    Wheelchair 50 feet with 2  turns activity    Assist        Assist Level: Total Assistance - Patient < 25%   Wheelchair 150 feet activity     Assist      Assist Level: Dependent - Patient 0%    Medical Problem List and Plan: 1.  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 Pt s/p left CEA.  Continue CIR  Team conference today to discuss current and goals and coordination of care, home and environmental barriers, and discharge planning with nursing, case manager, and therapies. Please see conference note from today as well.  2.  Antithrombotics: -DVT/anticoagulation:  Pharmaceutical:  Heparin DC'd due to hemoglobin             -antiplatelet therapy: ASA/Plavix held, will plan to restart ASA tomorrow given size of bleed/CEA 3. Pain Management:   As needed medications 4. Mood: LCSW to follow for evaluation and support.              -antipsychotic agents: N/A 5. Neuropsych: This patient is capable of making decisions on her own behalf. 6. Skin/Wound Care: Monitor incision for healing. Routine pressure relief measures.  7. Fluids/Electrolytes/Nutrition: Monitor I/Os.  8. HTN:   Inderal 60 mg bid  Monitor with increased mobility  Relatively controlled on 10/13 9.  AKI on CKD IIIa/b: Encourage fluid intake.   Creatinine 1.41 on 10/13  Echo reviewed, EF 70-7 5%, IVF nightly started on 10/11  Continue to monitor 10. ABLA:   Hemoglobin 10.2 on 10/13  Transfuse 2 units of PRBCs on 10/11  Continue to monitor 11. Anxiety/depression:  On valium bid to help manage anxiety state. Well controlled with medication.   Fluoxetine DC'd due to Plavix 12. IBS--diarrhea: Questran on hold 13.  Hyponatremia  Sodium 136 on 10/13  Continue to monitor 14.  Severe hypoalbuminemia  Supplement initiated on 10/5 15. BMI 16.77- dietary consult for assess nutritional needs.  16.  Post stroke dysphagia  D3 thins  Advance diet as tolerated   17.  Acute lower UTI +/- PNA  WBC within normal limits on 10/13  Afebrile  UA equivocal, urine culture showing E. coli  Chest x-ray personally reviewed, showing left opacity,?  Atelectasis vs PNA  Cefepime changed to Cefdinir 18.  Left rectus hematoma/?  Retroperitoneal hematoma/?  Aortic bleeding  Seen on abdominal CT and discussed with radiology  Appreciate vascular recs -no interventions recommended at this time, can continue to monitor  Clinically appears stable   LOS: 9 days A FACE TO FACE EVALUATION WAS PERFORMED  Elizabeth Tapia Karis Juba 10/16/2019, 8:59 AM

## 2019-10-16 NOTE — Progress Notes (Signed)
Patient ID: Elizabeth Tapia, female   DOB: [birth year], 86 y.o.   MRN: [id number]  Met with pt and son who is here in the room to discuss team conference progress toward her goals. She has bene ill this week and not made much progress and her diet had to be downgraded to Dys 1 nectar. She voiced she has not felt well and when you don;t feel well you don't feel like eating. She is hoping she is on the upswing and will do better from now on. Son was here for PT session and aware of her level at this time. Possibility of extending her stay due to missed therapy sessions being ill, but will discuss next week at conference. Work toward discharge and see if does better this coming week.

## 2019-10-16 NOTE — Plan of Care (Signed)
Problem: RH Balance Goal: LTG Patient will maintain dynamic standing balance (PT) Description: LTG:  Patient will maintain dynamic standing balance with assistance during mobility activities (PT) Flowsheets (Taken 10/16/2019 0746) LTG: Pt will maintain dynamic standing balance during mobility activities with:: (downgraded due to generalized weakness, decreased balance, and poor activity tolerance) Minimal Assistance - Patient > 75% Note: downgraded due to generalized weakness, decreased balance, and poor activity tolerance   Problem: Sit to Stand Goal: LTG:  Patient will perform sit to stand with assistance level (PT) Description: LTG:  Patient will perform sit to stand with assistance level (PT) Flowsheets (Taken 10/16/2019 0746) LTG: PT will perform sit to stand in preparation for functional mobility with assistance level: (downgraded due to generalized weakness, decreased balance, and poor activity tolerance) Minimal Assistance - Patient > 75% Note: downgraded due to generalized weakness, decreased balance, and poor activity tolerance   Problem: RH Bed Mobility Goal: LTG Patient will perform bed mobility with assist (PT) Description: LTG: Patient will perform bed mobility with assistance, with/without cues (PT). Flowsheets (Taken 10/16/2019 0746) LTG: Pt will perform bed mobility with assistance level of: (downgraded due to generalized weakness, decreased balance, and poor activity tolerance) Contact Guard/Touching assist Note: downgraded due to generalized weakness, decreased balance, and poor activity tolerance   Problem: RH Bed to Chair Transfers Goal: LTG Patient will perform bed/chair transfers w/assist (PT) Description: LTG: Patient will perform bed to chair transfers with assistance (PT). Flowsheets (Taken 10/16/2019 0746) LTG: Pt will perform Bed to Chair Transfers with assistance level: (downgraded due to generalized weakness, decreased balance, and poor activity tolerance)  Minimal Assistance - Patient > 75% Note: downgraded due to generalized weakness, decreased balance, and poor activity tolerance   Problem: RH Car Transfers Goal: LTG Patient will perform car transfers with assist (PT) Description: LTG: Patient will perform car transfers with assistance (PT). Flowsheets (Taken 10/16/2019 0746) LTG: Pt will perform car transfers with assist:: (downgraded due to generalized weakness, decreased balance, and poor activity tolerance) Minimal Assistance - Patient > 75% Note: downgraded due to generalized weakness, decreased balance, and poor activity tolerance   Problem: RH Ambulation Goal: LTG Patient will ambulate in controlled environment (PT) Description: LTG: Patient will ambulate in a controlled environment, # of feet with assistance (PT). Flowsheets (Taken 10/16/2019 0746) LTG: Pt will ambulate in controlled environ  assist needed:: (downgraded due to generalized weakness, decreased balance, and poor activity tolerance) Moderate Assistance - Patient 50 - 74% LTG: Ambulation distance in controlled environment: 34ft with LRAD Note: downgraded due to generalized weakness, decreased balance, and poor activity tolerance Goal: LTG Patient will ambulate in home environment (PT) Description: LTG: Patient will ambulate in home environment, # of feet with assistance (PT). Flowsheets (Taken 10/16/2019 0746) LTG: Pt will ambulate in home environ  assist needed:: (downgraded due to generalized weakness, decreased balance, and poor activity tolerance) Moderate Assistance - Patient 50 - 74% LTG: Ambulation distance in home environment: 7ft with LRAD Note: downgraded due to generalized weakness, decreased balance, and poor activity tolerance   Problem: RH Wheelchair Mobility Goal: LTG Patient will propel w/c in controlled environment (PT) Description: LTG: Patient will propel wheelchair in controlled environment, # of feet with assist (PT) Flowsheets (Taken 10/16/2019  0746) LTG: Pt will propel w/c in controlled environ  assist needed:: (downgraded due to generalized weakness and poor activity tolerance) Minimal Assistance - Patient > 75% LTG: Propel w/c distance in controlled environment: 94ft Note: downgraded due to generalized weakness and poor activity tolerance Goal: LTG  Patient will propel w/c in home environment (PT) Description: LTG: Patient will propel wheelchair in home environment, # of feet with assistance (PT). Flowsheets (Taken 10/16/2019 0746) LTG: Pt will propel w/c in home environ  assist needed:: (downgraded due to generalized weakness and poor activity tolerance) Minimal Assistance - Patient > 75% LTG: Propel w/c distance in home environment: 42ft Note: downgraded due to generalized weakness and poor activity tolerance   Problem: RH Stairs Goal: LTG Patient will ambulate up and down stairs w/assist (PT) Description: LTG: Patient will ambulate up and down # of stairs with assistance (PT) Flowsheets (Taken 10/16/2019 0746) LTG: Pt will ambulate up/down stairs assist needed:: (downgraded due to generalized weakness, decreased balance, and poor activity tolerance) Maximal Assistance - Patient 25 - 49% LTG: Pt will  ambulate up and down number of stairs: 1 step with 1 handrail Note: downgraded due to generalized weakness, decreased balance, and poor activity tolerance

## 2019-10-17 ENCOUNTER — Inpatient Hospital Stay (HOSPITAL_COMMUNITY): Payer: Medicare Other | Admitting: Speech Pathology

## 2019-10-17 ENCOUNTER — Inpatient Hospital Stay (HOSPITAL_COMMUNITY): Payer: Medicare Other

## 2019-10-17 ENCOUNTER — Inpatient Hospital Stay (HOSPITAL_COMMUNITY): Payer: Medicare Other | Admitting: Occupational Therapy

## 2019-10-17 LAB — CBC WITH DIFFERENTIAL/PLATELET
Abs Immature Granulocytes: 0.07 10*3/uL (ref 0.00–0.07)
Basophils Absolute: 0.1 10*3/uL (ref 0.0–0.1)
Basophils Relative: 1 %
Eosinophils Absolute: 0.3 10*3/uL (ref 0.0–0.5)
Eosinophils Relative: 3 %
HCT: 32.9 % — ABNORMAL LOW (ref 36.0–46.0)
Hemoglobin: 10.6 g/dL — ABNORMAL LOW (ref 12.0–15.0)
Immature Granulocytes: 1 %
Lymphocytes Relative: 14 %
Lymphs Abs: 1.7 10*3/uL (ref 0.7–4.0)
MCH: 29.9 pg (ref 26.0–34.0)
MCHC: 32.2 g/dL (ref 30.0–36.0)
MCV: 92.7 fL (ref 80.0–100.0)
Monocytes Absolute: 1.3 10*3/uL — ABNORMAL HIGH (ref 0.1–1.0)
Monocytes Relative: 11 %
Neutro Abs: 8.6 10*3/uL — ABNORMAL HIGH (ref 1.7–7.7)
Neutrophils Relative %: 70 %
Platelets: 503 10*3/uL — ABNORMAL HIGH (ref 150–400)
RBC: 3.55 MIL/uL — ABNORMAL LOW (ref 3.87–5.11)
RDW: 14.3 % (ref 11.5–15.5)
WBC: 12 10*3/uL — ABNORMAL HIGH (ref 4.0–10.5)
nRBC: 0 % (ref 0.0–0.2)

## 2019-10-17 MED ORDER — ASPIRIN 81 MG PO CHEW
81.0000 mg | CHEWABLE_TABLET | Freq: Every day | ORAL | Status: DC
  Administered 2019-10-17 – 2019-10-29 (×13): 81 mg via ORAL
  Filled 2019-10-17 (×13): qty 1

## 2019-10-17 NOTE — Progress Notes (Signed)
[facility] PHYSICAL MEDICINE & REHABILITATION PROGRESS NOTE  Subjective/Complaints: Patient seen sitting up in a chair this AM.  She states she slept well overnight.  Discussed with patient that attempted to call her son yesterday, however no answer.  She denies abdominal pain this morning.  ROS: Denies CP, SOB, N/V/D  Objective: Vital Signs: Blood pressure 139/60, pulse 80, temperature 98.2 F (36.8 C), resp. rate 16, height 5\' 2"  (1.575 m), weight 43.6 kg, SpO2 98 %. CT ABDOMEN PELVIS WO CONTRAST  Result Date: 10/15/2019 CLINICAL DATA:  Acute abdominal pain EXAM: CT ABDOMEN AND PELVIS WITHOUT CONTRAST TECHNIQUE: Multidetector CT imaging of the abdomen and pelvis was performed following the standard protocol without IV contrast. COMPARISON:  02/07/2016 and abdominal ultrasound from 02/08/2016 FINDINGS: Lower chest: Left anterior descending and aortic atherosclerosis. Trace bilateral pleural effusions. Airspace opacity along the left hemidiaphragm and in the posterior basal segment left lower lobe likely with at least some degree of volume loss. Bandlike atelectasis in the right lower lobe. Hepatobiliary: Prominent lateral segment left hepatic lobe, cannot exclude cirrhosis. Cholecystectomy. Common bile duct approximately 9 mm in diameter. Pancreas: No contour abnormality of the pancreas is identified. Spleen: Unremarkable Adrenals/Urinary Tract: No hydronephrosis or hydroureter. Displacement of the urinary bladder by pelvic hematoma. Stomach/Bowel: Cannot exclude wall thickening in the rectum. Vascular/Lymphatic: Aortoiliac atherosclerotic vascular disease. Abnormal ill definition of periaortic soft tissues for example on image 41/3 could be from acute retroperitoneal hematoma or less likely confluent retroperitoneal adenopathy. The suspected retroperitoneal hematoma tracks along the proximal iliac vessels and into the presacral region. Reproductive: Uterus absent. Left ovary somewhat obscured by  adjacent hematoma. Other: Mesenteric stranding in the abdomen and pelvis. Musculoskeletal: Lower left rectus sheath hematoma, about 9.7 by 5.8 by 14.6 cm (volume = 430 cm^3), with adjacent hematoma tracking in the left pelvic sidewall and along the left obturator internus, and with mixed density hematoma in the space of Retzius displacing the bladder posteriorly into the right side. This additional hematoma along the pelvic sidewall and space of Retzius measures about 10.2 by 5.6 by 4.2 cm (volume = 130 cm^3). Subcutaneous edema is present particularly along the pubis tracking into the left upper thigh. No obvious masslike hematoma in the upper thigh regions. Remote compression fractures at T12 and L1. Likely late subacute fracture at T8 with about 60% loss of vertebral body height. IMPRESSION: 1. Large (430 cubic cm) left lower rectus sheath hematoma, with a 130 cubic cm hematoma tracking along the left pelvic sidewall and space of Retzius. There is also an abnormal rind of density around the abdominal aorta suspicious for retroperitoneal hematoma, acute aortic leak/hemorrhage is a distinct possibility. 2. Trace bilateral pleural effusions. Airspace opacity along the left hemidiaphragm and in the posterior basal segment left lower lobe likely with at least some degree of volume loss. 3. Remote compression fractures at T12 and L1. Likely late subacute fracture at T8 with about 60% loss of vertebral body height. 4. Prominent lateral segment left hepatic lobe, cannot exclude cirrhosis. 5. Cannot exclude wall thickening in the rectum. 6. Subcutaneous and mesenteric edema. 7. Aortic atherosclerosis. Aortic Atherosclerosis (ICD10-I70.0). Critical Value/emergent results were called by telephone at the time of interpretation on 10/15/2019 at 12:33 pm to provider Dr. Maryla Morrow, who verbally acknowledged these results. Electronically Signed   By: Gaylyn Rong M.D.   On: 10/15/2019 12:33   Recent Labs     10/15/19 0429 10/16/19 0606  WBC 10.7* 9.5  HGB 10.2* 10.2*  HCT 30.9* 31.7*  PLT 410* 447*   Recent Labs    10/16/19 0606  NA 136  K 4.1  CL 102  CO2 23  GLUCOSE 96  BUN 29*  CREATININE 1.41*  CALCIUM 8.7*    Intake/Output Summary (Last 24 hours) at 10/17/2019 0910 Last data filed at 10/17/2019 0546 Gross per 24 hour  Intake 2251.57 ml  Output --  Net 2251.57 ml        Physical Exam: BP 139/60 (BP Location: Right Arm)   Pulse 80   Temp 98.2 F (36.8 C)   Resp 16   Ht 5\' 2"  (1.575 m)   Wt 43.6 kg   SpO2 98%   BMI 17.58 kg/m  Constitutional: No distress . Vital signs reviewed. HENT: Normocephalic.  Atraumatic. Eyes: EOMI. No discharge. Cardiovascular: No JVD.  RRR. Respiratory: Normal effort.  No stridor.  Bilateral clear to auscultation. GI: Non-distended.  BS +.  Nontender. Skin: Warm and dry.  Intact.  Neck incision CDI Psych: Flat.  Slowed. Musc: Right fourth digit PIP flexion contracture Kyphosis Neuro: Alert Mild dysarthria, stable Motor: RUE: Shoulder abduction 2+/5, elbow flexion/extension 2/5, handgrip 2/5, improving Right lower extremity: Hip flexion, knee extension 4-/5, ankle dorsiflexion 4+/5  Assessment/Plan: 1. Functional deficits secondary to left frontoparietal infarct which require 3+ hours per day of interdisciplinary therapy in a comprehensive inpatient rehab setting.  Physiatrist is providing close team supervision and 24 hour management of active medical problems listed below.  Physiatrist and rehab team continue to assess barriers to discharge/monitor patient progress toward functional and medical goals   Care Tool:  Bathing  Bathing activity did not occur: Refused Body parts bathed by patient: Right arm, Left arm, Chest, Abdomen, Face   Body parts bathed by helper: Front perineal area, Buttocks, Right upper leg, Left upper leg, Right lower leg, Left lower leg     Bathing assist Assist Level: 2 Helpers     Upper Body  Dressing/Undressing Upper body dressing   What is the patient wearing?: Pull over shirt    Upper body assist Assist Level: Moderate Assistance - Patient 50 - 74%    Lower Body Dressing/Undressing Lower body dressing      What is the patient wearing?: Pants     Lower body assist Assist for lower body dressing: Maximal Assistance - Patient 25 - 49%     Toileting Toileting    Toileting assist Assist for toileting: Total Assistance - Patient < 25%     Transfers Chair/bed transfer  Transfers assist  Chair/bed transfer activity did not occur: Safety/medical concerns  Chair/bed transfer assist level: Total Assistance - Patient < 25%     Locomotion Ambulation   Ambulation assist   Ambulation activity did not occur: Safety/medical concerns (R hemi, fatigue,generalized weakness, decresed balance/postural control)          Walk 10 feet activity   Assist  Walk 10 feet activity did not occur: Safety/medical concerns (R hemi, fatigue,generalized weakness, decresed balance/postural control)        Walk 50 feet activity   Assist Walk 50 feet with 2 turns activity did not occur: Safety/medical concerns (R hemi, fatigue,generalized weakness, decresed balance/postural control)         Walk 150 feet activity   Assist Walk 150 feet activity did not occur: Safety/medical concerns (R hemi, fatigue,generalized weakness, decresed balance/postural control)         Walk 10 feet on uneven surface  activity   Assist Walk 10 feet on uneven surfaces activity did  not occur: Safety/medical concerns (R hemi, fatigue,generalized weakness, decresed balance/postural control)         Wheelchair     Assist Will patient use wheelchair at discharge?: Yes Type of Wheelchair: Manual    Wheelchair assist level: Moderate Assistance - Patient 50 - 74% Max wheelchair distance: 63ft    Wheelchair 50 feet with 2 turns activity    Assist        Assist Level:  Total Assistance - Patient < 25%   Wheelchair 150 feet activity     Assist      Assist Level: Dependent - Patient 0%    Medical Problem List and Plan: 1.  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 Pt s/p left CEA.  Continue CIR 2.  Antithrombotics: -DVT/anticoagulation:  Pharmaceutical:  Heparin DC'd due to hemoglobin             -antiplatelet therapy: ASA/Plavix held, will plan to restart ASA given size of bleed/CEA after reviewing lab work 3. Pain Management:   As needed medications 4. Mood: LCSW to follow for evaluation and support.              -antipsychotic agents: N/A 5. Neuropsych: This patient is capable of making decisions on her own behalf. 6. Skin/Wound Care: Monitor incision for healing. Routine pressure relief measures.  7. Fluids/Electrolytes/Nutrition: Monitor I/Os.  8. HTN:   Inderal 60 mg bid  Monitor with increased mobility  Relatively controlled on 10/14 9.  AKI on CKD IIIa/b: Encourage fluid intake.   Creatinine 1.41 on 10/13, labs ordered for tomorrow  Echo reviewed, EF 70-7 5%, IVF nightly started on 10/11  Continue to monitor 10. ABLA:   Hemoglobin 10.2 on 10/13, labs pending  Transfuse 2 units of PRBCs on 10/11  Continue to monitor 11. Anxiety/depression:  On valium bid to help manage anxiety state. Well controlled with medication.   Fluoxetine DC'd due to Plavix 12. IBS--diarrhea: Questran on hold 13.  Hyponatremia  Sodium 136 on 10/13  Continue to monitor 14.  Severe hypoalbuminemia  Supplement initiated on 10/5 15. BMI 16.77- dietary consult for assess nutritional needs.  16.  Post stroke dysphagia  Downgraded to D1 nectar  Advance diet as tolerated  17.  Acute lower UTI +/- PNA  WBC within normal limits on 10/13  Afebrile  UA equivocal, urine culture showing E. coli  Chest x-ray personally reviewed, showing left opacity,?  Atelectasis vs PNA  Cefepime  changed to cefdinir 18.  Left rectus hematoma/?  Retroperitoneal hematoma/?  Aortic bleeding  Seen on abdominal CT and discussed with radiology  Appreciate vascular recs -no interventions recommended at this time, can continue to monitor  Clinically appears stable/improving, no changes   LOS: 10 days A FACE TO FACE EVALUATION WAS PERFORMED  Duquan Gillooly Karis Juba 10/17/2019, 9:10 AM

## 2019-10-17 NOTE — Progress Notes (Signed)
Nutrition Follow-up  DOCUMENTATION CODES:   Underweight, Severe malnutrition in context of chronic illness  INTERVENTION:   - d/c Ensure Enlive  - Vital Cuisine Shake TID with meals, each supplement provides 520 kcal and 22 grams of protein  - Magic cup TID with meals, each supplement provides 290 kcal and 9 grams of protein  - Continue ProSource Plus 30 ml po BID, each supplement provides 100 kcal and 15 grams of protein  NUTRITION DIAGNOSIS:   Severe Malnutrition related to chronic illness (CKD) as evidenced by severe muscle depletion, severe fat depletion.  Ongoing  GOAL:   Patient will meet greater than or equal to 90% of their needs  Progressing  MONITOR:   PO intake, Supplement acceptance, Diet advancement, Weight trends, Labs, Skin  REASON FOR ASSESSMENT:   Consult Assessment of nutrition requirement/status, Calorie Count  ASSESSMENT:   86 year old female with PMH of HTN, CKD, anxiety, depression, tremors. 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 On 10/01, pt underwent left carotid endarterectomy. Pt admitted to CIR on 10/04.  10/13 - diet downgraded to dysphagia 1 with nectar-thick liquids  Spoke with pt at bedside. Pt reports that she still does not have much of an appetite but that she is trying to eat. She reports that she was drinking Ensure supplements but now is on thickened liquids. Pt willing to consume Hormel Shakes (pre-thickened oral nutrition supplements). RD to order with meals.  Noted lunch meal tray at bedside. Pt had eaten a few bites of pureed cobbler but had consumed 100% of sweet potatoes (110 kcal, 1 gram of protein). Breakfast tray was also still in pt's room. Pt had consumed 100% of the cream of wheat (80 kcal, 3 grams of protein) and 50% of the nectar-thick orange juice (35 kcal, 0 grams of protein). Discussed with pt the importance  of increasing PO intake at meals as she is currently not eating enough to fuel her body adequately. Pt states that she will try to eat more.  Weight was 41.6 kg on admission and 43.6 kg on 10/15/19. Will continue to monitor.  Meal Completion: 0-40% x last 8 meals  Medications reviewed and include: ProSource Plus BID, Ensure Enlive BID, protonix IVF: NS @ 75 ml/hr x 12 hours overnight  Labs reviewed: BUN 29, creatinine 1.41  Diet Order:   Diet Order            DIET - DYS 1 Room service appropriate? Yes; Fluid consistency: Nectar Thick  Diet effective now                 EDUCATION NEEDS:   Education needs have been addressed  Skin:  Skin Assessment: Skin Integrity Issues: Incisions: neck Other: open blister to upper back  Last BM:  10/17/19  Height:   Ht Readings from Last 1 Encounters:  10/07/19 5\' 2"  (1.575 m)    Weight:   Wt Readings from Last 1 Encounters:  10/15/19 43.6 kg    Ideal Body Weight:  50 kg  BMI:  Body mass index is 17.58 kg/m.  Estimated Nutritional Needs:   Kcal:  1350-1550  Protein:  65-80 grams  Fluid:  1.4-1.6 L    12/15/19, MS, RD, LDN Inpatient Clinical Dietitian Please see [facility] for contact information.

## 2019-10-17 NOTE — Progress Notes (Signed)
Speech Language Pathology Daily Session Note  Patient Details  Name: Elizabeth Tapia MRN: [id number] Date of Birth: September 08, 1929  Today's Date: 10/17/2019 SLP Individual Time: 1130-1155 SLP Individual Time Calculation (min): 25 min  Short Term Goals: Week 2: SLP Short Term Goal 1 (Week 2): Pt will demonstrate sustained attention in 30 minute intervals with min A verbal cues during mildly complex tasks. SLP Short Term Goal 2 (Week 2): Pt will demonstrate mildly complex problem solving skills with min A verbal cues. SLP Short Term Goal 3 (Week 2): Pt will demonstrate self-monitoring and self-correcting of functional errors in problem solving tasks with min A verbal cues. SLP Short Term Goal 4 (Week 2): Pt will demonstrate recall of daily information (including speech and swallow strategies) with min A verbal cues for visual aids. SLP Short Term Goal 5 (Week 2): Pt will utilize swallow strategies when consuming dys 1 textures and nectar-thick liquids with minimal overt s/s aspiration and Min A verbal cues. SLP Short Term Goal 5 - Progress (Week 2): Revised due to lack of progress SLP Short Term Goal 6 (Week 2): Pt will demonstrate 85% intelligibility at sentence level with supervision A verbal cues to increase vocal intensity in a mildly nosiy environment..  Skilled Therapeutic Interventions: Skilled treatment session focused on cognitive and dysphagia goals. Upon arrival, patient was asleep but easily awakened to voice. SLP facilitated session by providing nectar-thick liquids via tsp. Patient consumed liquids without overt s/s of aspiration! Patient's daughter-in-law present and educated regarding patient's current swallowing function, diet recommendations and plan of care. She verbalized understanding. Patient more engaged and interactive today with more timely verbal responses. Patient recalled events from previous therapy sessions with overall Min A verbal cues. Patient left upright in bed with  alarm on and all needs within reach. Continue with current plan of care.      Pain Pain Assessment Pain Scale: 0-10 Pain Score: 0-No pain Faces Pain Scale: No hurt Pain Type: Acute pain Pain Location: Arm Pain Orientation: Left Pain Descriptors / Indicators: Aching Pain Frequency: Intermittent Pain Onset: On-going Patients Stated Pain Goal: 2 Pain Intervention(s): Medication (See eMAR)  Therapy/Group: Individual Therapy  Wash Nienhaus 10/17/2019, 12:00 PM

## 2019-10-17 NOTE — Progress Notes (Signed)
Physical Therapy Session Note  Patient Details  Name: Elizabeth Tapia MRN: [id number] Date of Birth: 07/17/1929  Today's Date: 10/17/2019 PT Individual Time: 4627-0350 and 1430-1458 PT Missed Time: 32 minutes Missed Time Reason: Fatigue PT Individual Time Calculation (min): 41 min and 28 min  Short Term Goals: Week 1:  PT Short Term Goal 1 (Week 1): Pt will perform bed mobility with supervision PT Short Term Goal 1 - Progress (Week 1): Progressing toward goal PT Short Term Goal 2 (Week 1): Pt wil transfer sit<>stand with LRAD min A PT Short Term Goal 2 - Progress (Week 1): Progressing toward goal PT Short Term Goal 3 (Week 1): Pt will perform simulated car transfer with LRAD mod A PT Short Term Goal 3 - Progress (Week 1): Progressing toward goal Week 2:  PT Short Term Goal 1 (Week 2): Pt will perform bed mobility with supervision consistantly PT Short Term Goal 2 (Week 2): Pt wil transfer sit<>stand with LRAD mod A consistantly PT Short Term Goal 3 (Week 2): Pt will perform simulated car transfer with LRAD mod A  Skilled Therapeutic Interventions/Progress Updates:   Treatment Session 1: 1017-1058 41 min Received pt sitting in [facility] with family present at bedside, pt agreeable to therapy, and denied any pain during session. Session with emphasis on functional mobility/transfers, generalized strengthening, dynamic standing balance/coordination, and improved activity tolerance. Pt transported to therapy gym in [facility] total A for energy conservation purposes and transferred sit<>stand max A x 3 trials inside // bars. Pt required manual facilitation for anterior weight shifting and to extend bilateral knees. Pt required verbal cues to "bring hips forward" and for upright posture. Pt able to remain standing approximately 45-60 seconds prior to sitting. During rest breaks pt frequently closing eyes but reported she slept well last night. Noted smell of BM and pt transported back to room in [facility] total A and  transferred WC<>bed squat<>pivot with total A and sit<>supine with max A. Doffed pants and noted pt with large loose BM in brief (NT made aware). Doffed dirty brief total A and pt rolled to L and required total A for peri-care. Due to time limitations notified NT to don clean brief. Concluded session with pt supine in bed, needs within reach, and bed alarm on. NT present to don new brief.    Treatment Session 2: 1430-1458 28 min Received pt supine in bed asleep, upon wakening pt reported fatigue stating "I'm 86 years old and I'm tired". Therapist re-oriented pt to correct age of 86 years old. When therapist asked what pt was willing to do pt stated "I don't wanna do anything." Pt denied any pain. Therapist encouraged bed level exercises, however pt politely declined stating "I really don't feel up to it." However, pt agreed to therapist providing HEP and going through it. Therapist provided pt with HEP consisting of the following exercises and educated pt on frequency/duration/technique: -Supine Heel Slide - 1 x daily - 7 x weekly - 2 sets - 10 reps -Supine Hip Abduction - 1 x daily - 7 x weekly - 2 sets - 10 reps -Supine Bridge - 1 x daily - 7 x weekly - 2 sets - 10 reps -Supine Active Straight Leg Raise - 1 x daily - 7 x weekly - 2 sets - 10 reps -Clamshell - 1 x daily - 7 x weekly - 2 sets - 10 reps Pt politely declined practicing any of the exercises listed above. Therapist assisted pt with taking 2 sips of nectar thick  water without cues for swallowing. Donned R wrist splint total A to promote wrist and finger extension. Concluded session with pt supine in bed, needs within reach, and bed alarm on. Pt asleep before therapist left room. 32 minutes missed of skilled physical therapy due to pt fatigue.   Therapy Documentation Precautions:  Precautions Precautions: Fall Precaution Comments: R hemiparesis Restrictions Weight Bearing Restrictions: No   Therapy/Group: Individual Therapy Martin Majestic PT, DPT   10/17/2019, 7:31 AM

## 2019-10-17 NOTE — Progress Notes (Signed)
Occupational Therapy Session Note  Patient Details  Name: Elizabeth Tapia MRN: [id number] Date of Birth: 1929-07-12  Today's Date: 10/17/2019 OT Individual Time: 8841-6606 OT Individual Time Calculation (min): 54 min    Short Term Goals: Week 2:  OT Short Term Goal 1 (Week 2): Pt will complete LB dressing with max assist at sit > stand level OT Short Term Goal 2 (Week 2): Pt will complete toilet transfers max assist OT Short Term Goal 3 (Week 2): Pt will complete bathing with mod assist at sit > stand level OT Short Term Goal 4 (Week 2): Pt will utilize RUE as gross assist during self-care tasks   Skilled Therapeutic Interventions/Progress Updates:    Pt greeted at time of session supine in bed with NT performing brief change at bed level d/t incontinent episode of BM, therapist assist for total A brief change but guided for bed mobility of rolling L and R, performed with Max A. Bed level LB dressing with cues for pt to flex hip/knee and bring feet up to her trunk, able to assist with threading and rolling L and R for donning over hips, Max A with pt attempts to help with LUE to don over hips in side lying. Supine to sit Max A, sitting balance initially Mod progressing to CGA at EOB. Total A squat pivot to wheelchair. UB dressing with Mod/Max with hemidressing technique. Pt set up to eat in wheelchair, performed with supervision to problem solve and attend to task, also to use SLP recommendations for strong swallow. Hand over hand assist for using R hand to help hold bowl while eating with L hand for NMR. Pt set up at sink level and therapist assist to get knots out of hair, but pt able to brush hair with Supervision. Oral hygiene Min A. Pt set up in wheelchair with alarm on, call bell in reach.    Therapy Documentation Precautions:  Precautions Precautions: Fall Precaution Comments: R hemiparesis Restrictions Weight Bearing Restrictions: No    Therapy/Group: Individual  Therapy  Erasmo Score 10/17/2019, 8:34 AM

## 2019-10-18 ENCOUNTER — Inpatient Hospital Stay (HOSPITAL_COMMUNITY): Payer: Medicare Other | Admitting: Speech Pathology

## 2019-10-18 ENCOUNTER — Inpatient Hospital Stay (HOSPITAL_COMMUNITY): Payer: Medicare Other

## 2019-10-18 ENCOUNTER — Inpatient Hospital Stay (HOSPITAL_COMMUNITY): Payer: Medicare Other | Admitting: Occupational Therapy

## 2019-10-18 LAB — BASIC METABOLIC PANEL
Anion gap: 10 (ref 5–15)
BUN: 21 mg/dL (ref 8–23)
CO2: 24 mmol/L (ref 22–32)
Calcium: 8.6 mg/dL — ABNORMAL LOW (ref 8.9–10.3)
Chloride: 102 mmol/L (ref 98–111)
Creatinine, Ser: 1.13 mg/dL — ABNORMAL HIGH (ref 0.44–1.00)
GFR, Estimated: 43 mL/min — ABNORMAL LOW (ref >60)
Glucose, Bld: 96 mg/dL (ref 70–99)
Potassium: 3.9 mmol/L (ref 3.5–5.1)
Sodium: 136 mmol/L (ref 135–145)

## 2019-10-18 LAB — CBC WITH DIFFERENTIAL/PLATELET
Abs Immature Granulocytes: 0.04 10*3/uL (ref 0.00–0.07)
Basophils Absolute: 0.1 10*3/uL (ref 0.0–0.1)
Basophils Relative: 1 %
Eosinophils Absolute: 0.4 10*3/uL (ref 0.0–0.5)
Eosinophils Relative: 4 %
HCT: 30.8 % — ABNORMAL LOW (ref 36.0–46.0)
Hemoglobin: 9.7 g/dL — ABNORMAL LOW (ref 12.0–15.0)
Immature Granulocytes: 0 %
Lymphocytes Relative: 21 %
Lymphs Abs: 1.9 10*3/uL (ref 0.7–4.0)
MCH: 29.4 pg (ref 26.0–34.0)
MCHC: 31.5 g/dL (ref 30.0–36.0)
MCV: 93.3 fL (ref 80.0–100.0)
Monocytes Absolute: 1.1 10*3/uL — ABNORMAL HIGH (ref 0.1–1.0)
Monocytes Relative: 12 %
Neutro Abs: 5.6 10*3/uL (ref 1.7–7.7)
Neutrophils Relative %: 62 %
Platelets: 496 10*3/uL — ABNORMAL HIGH (ref 150–400)
RBC: 3.3 MIL/uL — ABNORMAL LOW (ref 3.87–5.11)
RDW: 14.3 % (ref 11.5–15.5)
WBC: 9 10*3/uL (ref 4.0–10.5)
nRBC: 0 % (ref 0.0–0.2)

## 2019-10-18 NOTE — Progress Notes (Signed)
   VASCULAR SURGERY ASSESSMENT & PLAN:   S/P LEFT CAROTID ENDARTERECTOMY: She was doing well status post left carotid endarterectomy.  She has follow-up arranged with me as an outpatient  LEFT RECTUS HEMATOMA: She is asymptomatic and has been stable.  Vascular surgery will be available as needed.   SUBJECTIVE:   No complaints.  PHYSICAL EXAM:   Vitals:   10/17/19 0329 10/17/19 1258 10/17/19 1916 10/18/19 0430  BP: 139/60 (!) 109/42 128/62 138/65  Pulse: 80 81 82 75  Resp: 16 17 15 18   Temp: 98.2 F (36.8 C) 98.2 F (36.8 C) 97.9 F (36.6 C) 98.4 F (36.9 C)  TempSrc:      SpO2: 98% 99% 98% 98%  Weight:      Height:       Her left neck incision looks fine. She has some fullness to her left lower abdomen which is nontender.  LABS:   Lab Results  Component Value Date   WBC 9.0 10/18/2019   HGB 9.7 (L) 10/18/2019   HCT 30.8 (L) 10/18/2019   MCV 93.3 10/18/2019   PLT 496 (H) 10/18/2019   Lab Results  Component Value Date   CREATININE 1.13 (H) 10/18/2019   Lab Results  Component Value Date   INR 1.0 10/01/2019    PROBLEM LIST:    Principal Problem:   Cerebral infarction involving left carotid artery (HCC) Active Problems:   Hypoalbuminemia due to protein-calorie malnutrition (HCC)   Hyponatremia   Acute blood loss anemia   Essential hypertension   Right hemiparesis (HCC)   Labile blood pressure   Protein-calorie malnutrition, severe   Dysphagia, post-stroke   Leukocytosis   AKI (acute kidney injury) (HCC)   History of hypertension   Acute lower UTI   Hematoma   CURRENT MEDS:   . (feeding supplement) PROSource Plus  30 mL Oral BID BM  . aspirin  81 mg Oral Daily  . atorvastatin  80 mg Oral Daily  . cefdinir  300 mg Oral Q12H  . diazepam  2 mg Oral q12n4p  . FLUoxetine  10 mg Oral Daily  . pantoprazole  40 mg Oral Daily  . propranolol  60 mg Oral BID    10/03/2019 Office: Waverly Ferrari 10/18/2019

## 2019-10-18 NOTE — Progress Notes (Signed)
Occupational Therapy Session Note  Patient Details  Name: Elizabeth Tapia MRN: [id number] Date of Birth: [birth year]  Today's Date: 10/18/2019 OT Individual Time: 2585-2778 OT Individual Time Calculation (min): 60 min    Short Term Goals: Week 2:  OT Short Term Goal 1 (Week 2): Pt will complete LB dressing with max assist at sit > stand level OT Short Term Goal 2 (Week 2): Pt will complete toilet transfers max assist OT Short Term Goal 3 (Week 2): Pt will complete bathing with mod assist at sit > stand level OT Short Term Goal 4 (Week 2): Pt will utilize RUE as gross assist during self-care tasks  Skilled Therapeutic Interventions/Progress Updates:    Treatment session with focus on bed mobility, sitting balance, sit > stand, transfers, and functional use of RUE during self-care tasks.  Pt received semi-reclined in bed reporting feeling "better" today.  Noted pt to be incontinent of bladder, therefore engaged in LB bathing and hygiene at bed level with focus on rolling Rt and Lt.  Pt also noted to have been incontinent of bowel, therefore therapist completed hygiene total assist and donned clean brief at bed level. Max-total assist LB dressing, pt attempting bridging in bed to assist with clothing over hips.  Mod assist sidelying to sitting at EOB with increased sequencing.  Pt required min assist for sitting balance at EOB.  Completed squat pivot transfer to w/c with total assist with pt exhibiting some pushing tendencies with LUE.  Engaged in UB bathing and dressing at sink with education on hemi-technique.  Pt required max assist with UB dressing.  Engaged in oral care in sitting at sink with setup for items.  Pt remained upright in w/c with seat belt alarm on, half lap tray for improved positioning of RUE, and resting hand splint on RUE.  RN arriving to provide medication.  Therapy Documentation Precautions:  Precautions Precautions: Fall Precaution Comments: R  hemiparesis Restrictions Weight Bearing Restrictions: No Pain: Pain Assessment Pain Scale: 0-10 Pain Score: 0-No pain Faces Pain Scale: No hurt Pain Type: Acute pain   Therapy/Group: Individual Therapy  Rosalio Loud 10/18/2019, 10:36 AM

## 2019-10-18 NOTE — Progress Notes (Signed)
[place] PHYSICAL MEDICINE & REHABILITATION PROGRESS NOTE  Subjective/Complaints: Patient seen sitting up in bed this morning.  She states she slept well overnight.  She states she feels she is getting stronger.  Noted to be incontinent.  ROS: Denies CP, SOB, N/V/D  Objective: Vital Signs: Blood pressure 138/65, pulse 75, temperature 98.4 F (36.9 C), resp. rate 18, height 5\' 2"  (1.575 m), weight 43.6 kg, SpO2 98 %. No results found. Recent Labs    10/17/19 1151 10/18/19 0445  WBC 12.0* 9.0  HGB 10.6* 9.7*  HCT 32.9* 30.8*  PLT 503* 496*   Recent Labs    10/16/19 0606  NA 136  K 4.1  CL 102  CO2 23  GLUCOSE 96  BUN 29*  CREATININE 1.41*  CALCIUM 8.7*    Intake/Output Summary (Last 24 hours) at 10/18/2019 0912 Last data filed at 10/18/2019 0820 Gross per 24 hour  Intake 300 ml  Output --  Net 300 ml        Physical Exam: BP 138/65 (BP Location: Left Arm)   Pulse 75   Temp 98.4 F (36.9 C)   Resp 18   Ht 5\' 2"  (1.575 m)   Wt 43.6 kg   SpO2 98%   BMI 17.58 kg/m  Constitutional: No distress . Vital signs reviewed. HENT: Normocephalic.  Atraumatic. Eyes: EOMI. No discharge. Cardiovascular: No JVD.  RRR. Respiratory: Normal effort.  No stridor.  Bilateral clear to auscultation. GI: Non-distended.  BS +.  Mild TTP central abdomen with moderate pressure. Skin: Warm and dry.  Neck incision CDI Psych: Flat. Musc: Right fourth digit PIP flexion contracture Kyphosis Neuro: Alert Mild dysarthria, unchanged Motor: RUE: Shoulder abduction 2+/5, elbow flexion/extension 2/5, handgrip 1+/5 Right lower extremity: Hip flexion, knee extension 3-/5, ankle dorsiflexion 4+/5  Assessment/Plan: 1. Functional deficits secondary to left frontoparietal infarct which require 3+ hours per day of interdisciplinary therapy in a comprehensive inpatient rehab setting.  Physiatrist is providing close team supervision and 24 hour management of active medical problems listed  below.  Physiatrist and rehab team continue to assess barriers to discharge/monitor patient progress toward functional and medical goals   Care Tool:  Bathing  Bathing activity did not occur: Refused Body parts bathed by patient: Right arm, Left arm, Chest, Abdomen, Face   Body parts bathed by helper: Front perineal area, Buttocks, Right upper leg, Left upper leg, Right lower leg, Left lower leg     Bathing assist Assist Level: 2 Helpers     Upper Body Dressing/Undressing Upper body dressing   What is the patient wearing?: Pull over shirt    Upper body assist Assist Level: Moderate Assistance - Patient 50 - 74%    Lower Body Dressing/Undressing Lower body dressing      What is the patient wearing?: Pants     Lower body assist Assist for lower body dressing: Maximal Assistance - Patient 25 - 49%     Toileting Toileting    Toileting assist Assist for toileting: Total Assistance - Patient < 25%     Transfers Chair/bed transfer  Transfers assist  Chair/bed transfer activity did not occur: Safety/medical concerns  Chair/bed transfer assist level: Total Assistance - Patient < 25%     Locomotion Ambulation   Ambulation assist   Ambulation activity did not occur: Safety/medical concerns (R hemi, fatigue,generalized weakness, decresed balance/postural control)          Walk 10 feet activity   Assist  Walk 10 feet activity did not occur: Safety/medical  concerns (R hemi, fatigue,generalized weakness, decresed balance/postural control)        Walk 50 feet activity   Assist Walk 50 feet with 2 turns activity did not occur: Safety/medical concerns (R hemi, fatigue,generalized weakness, decresed balance/postural control)         Walk 150 feet activity   Assist Walk 150 feet activity did not occur: Safety/medical concerns (R hemi, fatigue,generalized weakness, decresed balance/postural control)         Walk 10 feet on uneven surface   activity   Assist Walk 10 feet on uneven surfaces activity did not occur: Safety/medical concerns (R hemi, fatigue,generalized weakness, decresed balance/postural control)         Wheelchair     Assist Will patient use wheelchair at discharge?: Yes Type of Wheelchair: Manual    Wheelchair assist level: Moderate Assistance - Patient 50 - 74% Max wheelchair distance: 49ft    Wheelchair 50 feet with 2 turns activity    Assist        Assist Level: Total Assistance - Patient < 25%   Wheelchair 150 feet activity     Assist      Assist Level: Dependent - Patient 0%    Medical Problem List and Plan: 1.  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 Pt s/p left CEA.  Continue CIR 2.  Antithrombotics: -DVT/anticoagulation:  Pharmaceutical:  Heparin DC'd due to hemoglobin             -antiplatelet therapy: ASA/Plavix held initially, aspirin 81 restarted on 10/14 3. Pain Management:   As needed medications 4. Mood: LCSW to follow for evaluation and support.              -antipsychotic agents: N/A 5. Neuropsych: This patient is capable of making decisions on her own behalf. 6. Skin/Wound Care: Monitor incision for healing. Routine pressure relief measures.  7. Fluids/Electrolytes/Nutrition: Monitor I/Os.  8. HTN:   Inderal 60 mg bid  Monitor with increased mobility  Relatively controlled on 10/15 9.  AKI on CKD IIIa/b: Encourage fluid intake.   Creatinine 1.41 on 10/13, labs pending  Echo reviewed, EF 70-7 5%, IVF nightly started on 10/11  Continue to monitor 10. ABLA:   Hemoglobin 9.7 on 10/15 May need to DC ASA if continues to trend down  Transfuse 2 units of PRBCs on 10/11  Continue to monitor 11. Anxiety/depression:  On valium bid to help manage anxiety state. Well controlled with medication.   Fluoxetine DC'd due to Plavix 12. IBS--diarrhea: Questran on hold 13.   Hyponatremia  Sodium 136 on 10/13  Continue to monitor 14.  Severe hypoalbuminemia  Supplement initiated on 10/5 15. BMI 16.77- dietary consult for assess nutritional needs.  16.  Post stroke dysphagia  Downgraded to D1 nectar  Advance diet as tolerated  17.  Acute lower UTI +/- PNA  WBC within normal limits on 10/13  Afebrile  UA equivocal, urine culture showing E. coli  Chest x-ray personally reviewed, showing left opacity,?  Atelectasis vs PNA  Cefepime changed to cefdinir 18.  Left rectus hematoma/?  Retroperitoneal hematoma/?  Aortic bleeding  Seen on abdominal CT and discussed with radiology  Appreciate vascular recs -no interventions recommended at this time, can continue to monitor  Clinically appears stable/improving, stable   LOS: 11 days A FACE TO FACE EVALUATION WAS PERFORMED  Elizabeth Tapia Karis Juba 10/18/2019, 9:12 AM

## 2019-10-18 NOTE — Progress Notes (Signed)
Speech Language Pathology Daily Session Note  Patient Details  Name: Elizabeth Tapia MRN: [id number] Date of Birth: November 27, 1929  Today's Date: 10/18/2019 SLP Individual Time: 1300-1400 SLP Individual Time Calculation (min): 60 min  Short Term Goals: Week 2: SLP Short Term Goal 1 (Week 2): Pt will demonstrate sustained attention in 30 minute intervals with min A verbal cues during mildly complex tasks. SLP Short Term Goal 2 (Week 2): Pt will demonstrate mildly complex problem solving skills with min A verbal cues. SLP Short Term Goal 3 (Week 2): Pt will demonstrate self-monitoring and self-correcting of functional errors in problem solving tasks with min A verbal cues. SLP Short Term Goal 4 (Week 2): Pt will demonstrate recall of daily information (including speech and swallow strategies) with min A verbal cues for visual aids. SLP Short Term Goal 5 (Week 2): Pt will utilize swallow strategies when consuming dys 1 textures and nectar-thick liquids with minimal overt s/s aspiration and Min A verbal cues. SLP Short Term Goal 5 - Progress (Week 2): Revised due to lack of progress SLP Short Term Goal 6 (Week 2): Pt will demonstrate 85% intelligibility at sentence level with supervision A verbal cues to increase vocal intensity in a mildly nosiy environment..  Skilled Therapeutic Interventions:   Patient alert and sitting in wheelchair when SLP entered room and was agreeable for ST session focusing on cognitive function goals. Patient was oriented to place, time, situation and said she has been feeling better. She was able to complete medication management simulation task with two medication bottles and use of pill box. After SLP provided verbal instructions and some setup, patient demonstrated ability to problem solve and started to request help when needed. She was 100% accurate with this task and with SLP providing modA fading to min-modA cues. Patient was pleased with how she did and feels that she  would like to do things independently if she is able to. She demonstrated adequate awareness during task. Patient continues to benefit from SLP intervention to maximize cognitive-linguistic and swallow function prior to discharge.  Pain  No, denies pain  Therapy/Group: Individual Therapy  Angela Nevin, MA, CCC-SLP Speech Therapy

## 2019-10-18 NOTE — Progress Notes (Signed)
Physical Therapy Session Note  Patient Details  Name: Dayzha Pogosyan MRN: [id number] Date of Birth: Mar 03, 1929  Today's Date: 10/18/2019 PT Individual Time: 1101-1156 PT Individual Time Calculation (min): 55 min   Short Term Goals: Week 1:  PT Short Term Goal 1 (Week 1): Pt will perform bed mobility with supervision PT Short Term Goal 1 - Progress (Week 1): Progressing toward goal PT Short Term Goal 2 (Week 1): Pt wil transfer sit<>stand with LRAD min A PT Short Term Goal 2 - Progress (Week 1): Progressing toward goal PT Short Term Goal 3 (Week 1): Pt will perform simulated car transfer with LRAD mod A PT Short Term Goal 3 - Progress (Week 1): Progressing toward goal Week 2:  PT Short Term Goal 1 (Week 2): Pt will perform bed mobility with supervision consistantly PT Short Term Goal 2 (Week 2): Pt wil transfer sit<>stand with LRAD mod A consistantly PT Short Term Goal 3 (Week 2): Pt will perform simulated car transfer with LRAD mod A  Skilled Therapeutic Interventions/Progress Updates:   Received pt sitting in WC, pt agreeable to therapy, and denied any pain during session but reported urge to urinate. Session with emphasis on functional mobility/transfers, toileting, generalized strengthening, dynamic standing balance/coordination, NMR, and improved activity tolerance. Pt transferred WC<>toilet with bedside commode over top total A and transferred sit<>stand from bedside commode max A and required total A to doff brief/pants. Pt able to void and with small BM (RN made aware). Pt transferred sit<>stand in [place] with max A and dependent for peri-care and to pull pants/brief over hips. Dependent transfer from commode to [facility] with Stedy for safety and due to fatigue. Pt transported to therapy gym in [facility] total A and transferred sit<>stand at table in dayroom with max A x 3 trials with verbal cues for upright posture/gaze, bilateral knee extension, to reach for WC armrests when sitting, and tactile cues  to bring chest up, and manual facilitation to promote knee extension. Once upright pt able to maintain balance with as little as min A for ~30-60 second bouts. While standing, worked on grabbing cones with L UE, crossing midline, and stacking them on R side of the table with emphasis on weight bearing through R UE, lateral weight shifting, and trunk mobility. Pt with increased difficulty maintaining balance once holding cones resulting in increased trunk and knee flexion requiring max A to maintain balance with dynamic standing activities. Pt transferred sit<>stand x 1 additional trial and worked on alternating marching x10 reps with mod A and therapist blocking R knee when marching L LE up due to tendency for R knee to buckle. Pt reported R LE felt "heavy" during exercise. Pt transported back to room WC total A. Concluded session with pt sitting in WC, needs within reach, and seatbelt alarm on. Half lap tray and R UE wrist splint donned.   Therapy Documentation Precautions:  Precautions Precautions: Fall Precaution Comments: R hemiparesis Restrictions Weight Bearing Restrictions: No   Therapy/Group: Individual Therapy Martin Majestic PT, DPT   10/18/2019, 7:34 AM

## 2019-10-19 ENCOUNTER — Inpatient Hospital Stay (HOSPITAL_COMMUNITY): Payer: Medicare Other | Admitting: Physical Therapy

## 2019-10-19 ENCOUNTER — Inpatient Hospital Stay (HOSPITAL_COMMUNITY): Payer: Medicare Other | Admitting: Speech Pathology

## 2019-10-19 LAB — CBC WITH DIFFERENTIAL/PLATELET
Abs Immature Granulocytes: 0.04 10*3/uL (ref 0.00–0.07)
Basophils Absolute: 0.1 10*3/uL (ref 0.0–0.1)
Basophils Relative: 1 %
Eosinophils Absolute: 0.4 10*3/uL (ref 0.0–0.5)
Eosinophils Relative: 5 %
HCT: 28.4 % — ABNORMAL LOW (ref 36.0–46.0)
Hemoglobin: 8.9 g/dL — ABNORMAL LOW (ref 12.0–15.0)
Immature Granulocytes: 1 %
Lymphocytes Relative: 23 %
Lymphs Abs: 2.1 10*3/uL (ref 0.7–4.0)
MCH: 29 pg (ref 26.0–34.0)
MCHC: 31.3 g/dL (ref 30.0–36.0)
MCV: 92.5 fL (ref 80.0–100.0)
Monocytes Absolute: 1.1 10*3/uL — ABNORMAL HIGH (ref 0.1–1.0)
Monocytes Relative: 12 %
Neutro Abs: 5.2 10*3/uL (ref 1.7–7.7)
Neutrophils Relative %: 58 %
Platelets: 490 10*3/uL — ABNORMAL HIGH (ref 150–400)
RBC: 3.07 MIL/uL — ABNORMAL LOW (ref 3.87–5.11)
RDW: 14.1 % (ref 11.5–15.5)
WBC: 8.8 10*3/uL (ref 4.0–10.5)
nRBC: 0 % (ref 0.0–0.2)

## 2019-10-19 NOTE — Progress Notes (Signed)
Physical Therapy Session Note  Patient Details  Name: Elizabeth Tapia MRN: [id number] Date of Birth: [birth year]  Today's Date: 10/19/2019 PT Individual Time: 1300-1325 PT Individual Time Calculation (min): 25 min   Pt missed 35 mins of skilled PT session 2/2 fatigue.   Short Term Goals: Week 1:  PT Short Term Goal 1 (Week 1): Pt will perform bed mobility with supervision PT Short Term Goal 1 - Progress (Week 1): Progressing toward goal PT Short Term Goal 2 (Week 1): Pt wil transfer sit<>stand with LRAD min A PT Short Term Goal 2 - Progress (Week 1): Progressing toward goal PT Short Term Goal 3 (Week 1): Pt will perform simulated car transfer with LRAD mod A PT Short Term Goal 3 - Progress (Week 1): Progressing toward goal Week 2:  PT Short Term Goal 1 (Week 2): Pt will perform bed mobility with supervision consistantly PT Short Term Goal 2 (Week 2): Pt wil transfer sit<>stand with LRAD mod A consistantly PT Short Term Goal 3 (Week 2): Pt will perform simulated car transfer with LRAD mod A  Skilled Therapeutic Interventions/Progress Updates:    pt received in bed with lunch tray present and nursing. PT agreeable to work with pt for feeding, nursing left. Pt directed in improved positioning in bed, with HOB elevated, sitting in midline and upright in bed, and improved tray setup to increase I for pt to reach and self feed. Pt directed in attempting to reach forward and take small bites and sips of tea. Pt able to feed self food however needed total A for drinks with spoon feeding; pt demonstrated lethargy with last few bites of meal and required VC for increased alertness and attention to task. Pt completed lunch with assistance listed above and in good condition. PT attempted to direct pt in sitting EOB and pt denied 2/2 fatigue and requested to remain in bed. Pt assisted in supine with hospital bed functions for comfort per pt, left in bed, alarm set, All needs in reach and in good condition.  Call light in hand.    Therapy Documentation Precautions:  Precautions Precautions: Fall Precaution Comments: R hemiparesis Restrictions Weight Bearing Restrictions: No    Therapy/Group: Individual Therapy  Barbaraann Faster 10/19/2019, 2:37 PM

## 2019-10-19 NOTE — Progress Notes (Signed)
[place] PHYSICAL MEDICINE & REHABILITATION PROGRESS NOTE  Subjective/Complaints:  No issues overnite , denies left sided neck pain , appreciate VVS note  ROS: Denies CP, SOB, N/V/D  Objective: Vital Signs: Blood pressure 134/63, pulse 76, temperature 98.7 F (37.1 C), temperature source Oral, resp. rate 16, height 5\' 2"  (1.575 m), weight 43.6 kg, SpO2 98 %. No results found. Recent Labs    10/18/19 0445 10/19/19 0442  WBC 9.0 8.8  HGB 9.7* 8.9*  HCT 30.8* 28.4*  PLT 496* 490*   Recent Labs    10/18/19 0445  NA 136  K 3.9  CL 102  CO2 24  GLUCOSE 96  BUN 21  CREATININE 1.13*  CALCIUM 8.6*    Intake/Output Summary (Last 24 hours) at 10/19/2019 0653 Last data filed at 10/18/2019 1845 Gross per 24 hour  Intake 298 ml  Output --  Net 298 ml        Physical Exam: BP 134/63 (BP Location: Left Arm)   Pulse 76   Temp 98.7 F (37.1 C) (Oral)   Resp 16   Ht 5\' 2"  (1.575 m)   Wt 43.6 kg   SpO2 98%   BMI 17.58 kg/m   General: No acute distress Mood and affect are appropriate Heart: Regular rate and rhythm no rubs murmurs or extra sounds Lungs: Clear to auscultation, breathing unlabored, no rales or wheezes Abdomen: Positive bowel sounds, soft nontender to palpation, nondistended Extremities: No clubbing, cyanosis, or edema Skin: No evidence of breakdown, no evidence of rash Motor: RUE: Shoulder abduction 2+/5, elbow flexion/extension 2/5, handgrip 1+/5- unchanged Right lower extremity: Hip flexion, knee extension 3-/5, ankle dorsiflexion 4+/5-unchanged  Assessment/Plan: 1. Functional deficits secondary to left frontoparietal infarct which require 3+ hours per day of interdisciplinary therapy in a comprehensive inpatient rehab setting.  Physiatrist is providing close team supervision and 24 hour management of active medical problems listed below.  Physiatrist and rehab team continue to assess barriers to discharge/monitor patient progress toward  functional and medical goals   Care Tool:  Bathing  Bathing activity did not occur: Refused Body parts bathed by patient: Right arm, Left arm, Chest, Abdomen, Face   Body parts bathed by helper: Front perineal area, Buttocks, Right upper leg, Left upper leg, Right lower leg, Left lower leg     Bathing assist Assist Level: Maximal Assistance - Patient 24 - 49%     Upper Body Dressing/Undressing Upper body dressing   What is the patient wearing?: Pull over shirt    Upper body assist Assist Level: Maximal Assistance - Patient 25 - 49%    Lower Body Dressing/Undressing Lower body dressing      What is the patient wearing?: Pants     Lower body assist Assist for lower body dressing: Maximal Assistance - Patient 25 - 49%     Toileting Toileting    Toileting assist Assist for toileting: Total Assistance - Patient < 25%     Transfers Chair/bed transfer  Transfers assist  Chair/bed transfer activity did not occur: Safety/medical concerns  Chair/bed transfer assist level: Total Assistance - Patient < 25%     Locomotion Ambulation   Ambulation assist   Ambulation activity did not occur: Safety/medical concerns (R hemi, fatigue,generalized weakness, decresed balance/postural control)          Walk 10 feet activity   Assist  Walk 10 feet activity did not occur: Safety/medical concerns (R hemi, fatigue,generalized weakness, decresed balance/postural control)        Walk 50  feet activity   Assist Walk 50 feet with 2 turns activity did not occur: Safety/medical concerns (R hemi, fatigue,generalized weakness, decresed balance/postural control)         Walk 150 feet activity   Assist Walk 150 feet activity did not occur: Safety/medical concerns (R hemi, fatigue,generalized weakness, decresed balance/postural control)         Walk 10 feet on uneven surface  activity   Assist Walk 10 feet on uneven surfaces activity did not occur: Safety/medical  concerns (R hemi, fatigue,generalized weakness, decresed balance/postural control)         Wheelchair     Assist Will patient use wheelchair at discharge?: Yes Type of Wheelchair: Manual    Wheelchair assist level: Moderate Assistance - Patient 50 - 74% Max wheelchair distance: 32ft    Wheelchair 50 feet with 2 turns activity    Assist        Assist Level: Total Assistance - Patient < 25%   Wheelchair 150 feet activity     Assist      Assist Level: Dependent - Patient 0%    Medical Problem List and Plan: 1.  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 Pt s/p left CEA on 10/04/19.  Continue CIR PT, OT, SLP  2.  Antithrombotics: -DVT/anticoagulation:  Pharmaceutical:  Heparin DC'd due to hemoglobin             -antiplatelet therapy: ASA/Plavix held initially, aspirin 81 restarted on 10/14 3. Pain Management:   As needed medications 4. Mood: LCSW to follow for evaluation and support.              -antipsychotic agents: N/A 5. Neuropsych: This patient is capable of making decisions on her own behalf. 6. Skin/Wound Care: Monitor incision for healing. Routine pressure relief measures.  7. Fluids/Electrolytes/Nutrition: Monitor I/Os. - poor appetite as discussed with SLP try magic cups 8. HTN:   Inderal 60 mg bid  Monitor with increased mobility  Relatively controlled on 10/15 9.  AKI on CKD IIIa/b: Encourage fluid intake.   Creatinine 1.41 on 10/13, labs pending  Echo reviewed, EF 70-7 5%, IVF nightly started on 10/11  Continue to monitor 10. ABLA:   Hemoglobin 9.7 on 10/15 May need to DC ASA if continues to trend down, 8.9 today will recheck in am   Transfuse 2 units of PRBCs on 10/11  Continue to monitor 11. Anxiety/depression:  On valium bid to help manage anxiety state. Well controlled with medication.   Fluoxetine DC'd due to Plavix 12. IBS--diarrhea: Questran on  hold 13.  Hyponatremia  Sodium 136 on 10/13  Continue to monitor 14.  Severe hypoalbuminemia  Supplement initiated on 10/5 15. BMI 16.77- dietary consult for assess nutritional needs.  16.  Post stroke dysphagia  Downgraded to D1 nectar  Advance diet as tolerated  17.  Acute lower UTI +/- PNA  WBC within normal limits on 10/13  Afebrile  UA equivocal, urine culture showing E. coli  Chest x-ray personally reviewed, showing left opacity,?  Atelectasis vs PNA  Cefepime changed to cefdinir 18.  Left rectus hematoma/?  Retroperitoneal hematoma/?  Aortic bleeding  Seen on abdominal CT and discussed with radiology  Appreciate vascular recs -no interventions recommended at this time, can continue to monitor  Clinically appears stable/improving, stable   LOS: 12 days A FACE TO FACE EVALUATION WAS PERFORMED  Erick Colace 10/19/2019, 6:53 AM

## 2019-10-19 NOTE — Progress Notes (Signed)
Speech Language Pathology Daily Session Note  Patient Details  Name: Hanah Moultry MRN: [id number] Date of Birth: [birth year]  Today's Date: 10/19/2019 SLP Individual Time: 0700-0800 SLP Individual Time Calculation (min): 60 min  Short Term Goals: Week 2: SLP Short Term Goal 1 (Week 2): Pt will demonstrate sustained attention in 30 minute intervals with min A verbal cues during mildly complex tasks. SLP Short Term Goal 2 (Week 2): Pt will demonstrate mildly complex problem solving skills with min A verbal cues. SLP Short Term Goal 3 (Week 2): Pt will demonstrate self-monitoring and self-correcting of functional errors in problem solving tasks with min A verbal cues. SLP Short Term Goal 4 (Week 2): Pt will demonstrate recall of daily information (including speech and swallow strategies) with min A verbal cues for visual aids. SLP Short Term Goal 5 (Week 2): Pt will utilize swallow strategies when consuming dys 1 textures and nectar-thick liquids with minimal overt s/s aspiration and Min A verbal cues. SLP Short Term Goal 5 - Progress (Week 2): Revised due to lack of progress SLP Short Term Goal 6 (Week 2): Pt will demonstrate 85% intelligibility at sentence level with supervision A verbal cues to increase vocal intensity in a mildly nosiy environment..  Skilled Therapeutic Interventions: Skilled treatment session focused on dysphagia and cognitive goals. SLP facilitated session by providing skilled observation with breakfast meal of Dys. 1 textures with nectar-thick liquids via cup. Patient consumed meal without overt s/s of aspiration but required Min verbal cues for problem solving with self-feeding. Recommend patient continue current diet. Patient requested to use the bathroom but had already been incontinent of bowel. Patient transferred to the commode via the stedy with +2 assist and Max A multimodal cues needed for safety with task. Patient able to complete using the bathroom and was  transferred back to bed. Patient left upright in bed with alarm on and all needs within reach. Continue with current plan of care.      Pain No/Denies Pain   Therapy/Group: Individual Therapy  Smriti Barkow 10/19/2019, 1:12 PM

## 2019-10-20 ENCOUNTER — Inpatient Hospital Stay (HOSPITAL_COMMUNITY): Payer: Medicare Other | Admitting: Speech Pathology

## 2019-10-20 LAB — CBC WITH DIFFERENTIAL/PLATELET
Abs Immature Granulocytes: 0.03 10*3/uL (ref 0.00–0.07)
Basophils Absolute: 0.1 10*3/uL (ref 0.0–0.1)
Basophils Relative: 1 %
Eosinophils Absolute: 0.4 10*3/uL (ref 0.0–0.5)
Eosinophils Relative: 5 %
HCT: 31 % — ABNORMAL LOW (ref 36.0–46.0)
Hemoglobin: 9.9 g/dL — ABNORMAL LOW (ref 12.0–15.0)
Immature Granulocytes: 0 %
Lymphocytes Relative: 19 %
Lymphs Abs: 1.6 10*3/uL (ref 0.7–4.0)
MCH: 29.7 pg (ref 26.0–34.0)
MCHC: 31.9 g/dL (ref 30.0–36.0)
MCV: 93.1 fL (ref 80.0–100.0)
Monocytes Absolute: 1.1 10*3/uL — ABNORMAL HIGH (ref 0.1–1.0)
Monocytes Relative: 13 %
Neutro Abs: 5.5 10*3/uL (ref 1.7–7.7)
Neutrophils Relative %: 62 %
Platelets: 557 10*3/uL — ABNORMAL HIGH (ref 150–400)
RBC: 3.33 MIL/uL — ABNORMAL LOW (ref 3.87–5.11)
RDW: 14.2 % (ref 11.5–15.5)
WBC: 8.8 10*3/uL (ref 4.0–10.5)
nRBC: 0 % (ref 0.0–0.2)

## 2019-10-20 MED ORDER — STARCH (THICKENING) PO POWD: ORAL | Status: DC | PRN

## 2019-10-20 MED ORDER — DRONABINOL 2.5 MG PO CAPS
2.5000 mg | ORAL_CAPSULE | Freq: Every day | ORAL | Status: DC
  Administered 2019-10-20 – 2019-10-28 (×9): 2.5 mg via ORAL
  Filled 2019-10-20 (×9): qty 1

## 2019-10-20 MED ORDER — RESOURCE THICKENUP CLEAR PO POWD
ORAL | Status: DC | PRN
  Filled 2019-10-20: qty 125

## 2019-10-20 NOTE — Progress Notes (Signed)
Speech Language Pathology Daily Session Note  Patient Details  Name: Elizabeth Tapia MRN: [id number] Date of Birth: 07/19/1929  Today's Date: 10/20/2019 SLP Individual Time: 1435-1500 SLP Individual Time Calculation (min): 25 min  Short Term Goals: Week 2: SLP Short Term Goal 1 (Week 2): Pt will demonstrate sustained attention in 30 minute intervals with min A verbal cues during mildly complex tasks. SLP Short Term Goal 2 (Week 2): Pt will demonstrate mildly complex problem solving skills with min A verbal cues. SLP Short Term Goal 3 (Week 2): Pt will demonstrate self-monitoring and self-correcting of functional errors in problem solving tasks with min A verbal cues. SLP Short Term Goal 4 (Week 2): Pt will demonstrate recall of daily information (including speech and swallow strategies) with min A verbal cues for visual aids. SLP Short Term Goal 5 (Week 2): Pt will utilize swallow strategies when consuming dys 1 textures and nectar-thick liquids with minimal overt s/s aspiration and Min A verbal cues. SLP Short Term Goal 5 - Progress (Week 2): Revised due to lack of progress SLP Short Term Goal 6 (Week 2): Pt will demonstrate 85% intelligibility at sentence level with supervision A verbal cues to increase vocal intensity in a mildly nosiy environment..  Skilled Therapeutic Interventions:  Pt was seen for skilled ST targeting dysphagia goals.  Pt was sleeping upon therapist's arrival but awakened easily and was agreeable to participating in treatment session.  SLP facilitated the session with trials of small sips of thin liquids (2/2 difficulty manipulating liquids with a spoon) to continue working towards diet progression.  Pt demonstrated immediate coughing in 3 out of 4 opportunities with reports of a residual "tickle" in her throat which cleared with min cues for strong cough followed by effortful swallow.  No coughing noted with nectar thick liquids via cup sips.  Recommend that pt remain on  her currently prescribed diet with ongoing trials of advanced textures with SLP.  Pt was left in bed with bed alarm set and call bell within reach.  Continue per current plan of care.    Pain Pain Assessment Pain Scale: 0-10 Pain Score: 0-No pain  Therapy/Group: Individual Therapy  Elizabeth Tapia, Elizabeth Tapia 10/20/2019, 3:25 PM

## 2019-10-20 NOTE — Progress Notes (Signed)
[facility] PHYSICAL MEDICINE & REHABILITATION PROGRESS NOTE  Subjective/Complaints:  No issues overnite   ROS: Denies CP, SOB, N/V/D  Objective: Vital Signs: Blood pressure 125/68, pulse 75, temperature 98.6 F (37 C), temperature source Oral, resp. rate 16, height 5\' 2"  (1.575 m), weight 43.6 kg, SpO2 97 %. No results found. Recent Labs    10/19/19 0442 10/20/19 0435  WBC 8.8 8.8  HGB 8.9* 9.9*  HCT 28.4* 31.0*  PLT 490* 557*   Recent Labs    10/18/19 0445  NA 136  K 3.9  CL 102  CO2 24  GLUCOSE 96  BUN 21  CREATININE 1.13*  CALCIUM 8.6*    Intake/Output Summary (Last 24 hours) at 10/20/2019 0658 Last data filed at 10/19/2019 1750 Gross per 24 hour  Intake 290 ml  Output --  Net 290 ml        Physical Exam: BP 125/68 (BP Location: Left Arm)   Pulse 75   Temp 98.6 F (37 C) (Oral)   Resp 16   Ht 5\' 2"  (1.575 m)   Wt 43.6 kg   SpO2 97%   BMI 17.58 kg/m   General: No acute distress Mood and affect are appropriate Heart: Regular rate and rhythm no rubs murmurs or extra sounds Lungs: Clear to auscultation, breathing unlabored, no rales or wheezes Abdomen: Positive bowel sounds, soft nontender to palpation, nondistended Extremities: No clubbing, cyanosis, or edema Skin: No evidence of breakdown, no evidence of rash   Motor: RUE: Shoulder abduction 2+/5, elbow flexion/extension 2/5, handgrip 1+/5- unchanged Right lower extremity: Hip flexion, knee extension 3-/5, ankle dorsiflexion 4+/5-unchanged  Assessment/Plan: 1. Functional deficits secondary to left frontoparietal infarct which require 3+ hours per day of interdisciplinary therapy in a comprehensive inpatient rehab setting.  Physiatrist is providing close team supervision and 24 hour management of active medical problems listed below.  Physiatrist and rehab team continue to assess barriers to discharge/monitor patient progress toward functional and medical goals   Care Tool:  Bathing   Bathing activity did not occur: Refused Body parts bathed by patient: Right arm, Left arm, Chest, Abdomen, Face   Body parts bathed by helper: Front perineal area, Buttocks, Right upper leg, Left upper leg, Right lower leg, Left lower leg     Bathing assist Assist Level: Maximal Assistance - Patient 24 - 49%     Upper Body Dressing/Undressing Upper body dressing   What is the patient wearing?: Pull over shirt    Upper body assist Assist Level: Maximal Assistance - Patient 25 - 49%    Lower Body Dressing/Undressing Lower body dressing      What is the patient wearing?: Pants     Lower body assist Assist for lower body dressing: Maximal Assistance - Patient 25 - 49%     Toileting Toileting    Toileting assist Assist for toileting: Total Assistance - Patient < 25%     Transfers Chair/bed transfer  Transfers assist  Chair/bed transfer activity did not occur: Safety/medical concerns  Chair/bed transfer assist level: Total Assistance - Patient < 25%     Locomotion Ambulation   Ambulation assist   Ambulation activity did not occur: Safety/medical concerns (R hemi, fatigue,generalized weakness, decresed balance/postural control)          Walk 10 feet activity   Assist  Walk 10 feet activity did not occur: Safety/medical concerns (R hemi, fatigue,generalized weakness, decresed balance/postural control)        Walk 50 feet activity   Assist Walk 50  feet with 2 turns activity did not occur: Safety/medical concerns (R hemi, fatigue,generalized weakness, decresed balance/postural control)         Walk 150 feet activity   Assist Walk 150 feet activity did not occur: Safety/medical concerns (R hemi, fatigue,generalized weakness, decresed balance/postural control)         Walk 10 feet on uneven surface  activity   Assist Walk 10 feet on uneven surfaces activity did not occur: Safety/medical concerns (R hemi, fatigue,generalized weakness, decresed  balance/postural control)         Wheelchair     Assist Will patient use wheelchair at discharge?: Yes Type of Wheelchair: Manual    Wheelchair assist level: Moderate Assistance - Patient 50 - 74% Max wheelchair distance: 32ft    Wheelchair 50 feet with 2 turns activity    Assist        Assist Level: Total Assistance - Patient < 25%   Wheelchair 150 feet activity     Assist      Assist Level: Dependent - Patient 0%    Medical Problem List and Plan: 1.  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 Pt s/p left CEA on 10/04/19.  Continue CIR PT, OT, SLP  2.  Antithrombotics: -DVT/anticoagulation:  Pharmaceutical:  Heparin DC'd due to hemoglobin             -antiplatelet therapy: ASA/Plavix held initially, aspirin 81 restarted on 10/14 3. Pain Management:   As needed medications 4. Mood: LCSW to follow for evaluation and support.              -antipsychotic agents: N/A 5. Neuropsych: This patient is capable of making decisions on her own behalf. 6. Skin/Wound Care: Monitor incision for healing. Routine pressure relief measures.  7. Fluids/Electrolytes/Nutrition: Monitor I/Os. - poor appetite as discussed with SLP try magic cups- trial marinol avoid megace for now unless heparin can be resumed 8. HTN:   Inderal 60 mg bid  Monitor with increased mobility   Vitals:   10/19/19 1925 10/20/19 0512  BP: 134/63 125/68  Pulse: 79 75  Resp: 15 16  Temp: 98.9 F (37.2 C) 98.6 F (37 C)  SpO2: 97% 97%   9.  AKI on CKD IIIa/b: Encourage fluid intake.   Creatinine 1.41 on 10/13, labs pending  Echo reviewed, EF 70-7 5%, IVF nightly started on 10/11  Continue to monitor 10. ABLA:   Hemoglobin 9.7 on 10/15 May need to DC ASA if continues to trend down, 8.9 on 10/16, 9.9 10/17 Transfuse 2 units of PRBCs on 10/11  Continue to monitor 11. Anxiety/depression:  On valium bid to help manage  anxiety state. Well controlled with medication.   Fluoxetine DC'd due to Plavix 12. IBS--diarrhea: Questran on hold 13.  Hyponatremia  Sodium 136 on 10/13  Continue to monitor 14.  Severe hypoalbuminemia  Supplement initiated on 10/5 15. BMI 16.77- dietary consult for assess nutritional needs.  16.  Post stroke dysphagia  Downgraded to D1 nectar  Advance diet as tolerated  17.  Acute lower UTI +/- PNA  WBC within normal limits on 10/13  Afebrile  UA equivocal, urine culture showing E. coli  Chest x-ray personally reviewed, showing left opacity,?  Atelectasis vs PNA  Cefepime changed to cefdinir 18.  Left rectus hematoma/?  Retroperitoneal hematoma/?  Aortic bleeding  Seen on abdominal CT and discussed with radiology  Appreciate vascular recs -no interventions recommended at this time, can continue to  monitor  Clinically appears stable/improving, stable   LOS: 13 days A FACE TO FACE EVALUATION WAS PERFORMED  Erick Colace 10/20/2019, 6:58 AM

## 2019-10-21 ENCOUNTER — Inpatient Hospital Stay (HOSPITAL_COMMUNITY): Payer: Medicare Other | Admitting: Occupational Therapy

## 2019-10-21 ENCOUNTER — Inpatient Hospital Stay (HOSPITAL_COMMUNITY): Payer: Medicare Other

## 2019-10-21 LAB — BASIC METABOLIC PANEL
Anion gap: 9 (ref 5–15)
BUN: 16 mg/dL (ref 8–23)
CO2: 25 mmol/L (ref 22–32)
Calcium: 8.3 mg/dL — ABNORMAL LOW (ref 8.9–10.3)
Chloride: 104 mmol/L (ref 98–111)
Creatinine, Ser: 0.98 mg/dL (ref 0.44–1.00)
GFR, Estimated: 52 mL/min — ABNORMAL LOW (ref >60)
Glucose, Bld: 106 mg/dL — ABNORMAL HIGH (ref 70–99)
Potassium: 3.4 mmol/L — ABNORMAL LOW (ref 3.5–5.1)
Sodium: 138 mmol/L (ref 135–145)

## 2019-10-21 LAB — CBC WITH DIFFERENTIAL/PLATELET
Abs Immature Granulocytes: 0.03 10*3/uL (ref 0.00–0.07)
Basophils Absolute: 0.1 10*3/uL (ref 0.0–0.1)
Basophils Relative: 1 %
Eosinophils Absolute: 0.4 10*3/uL (ref 0.0–0.5)
Eosinophils Relative: 5 %
HCT: 30.8 % — ABNORMAL LOW (ref 36.0–46.0)
Hemoglobin: 9.9 g/dL — ABNORMAL LOW (ref 12.0–15.0)
Immature Granulocytes: 0 %
Lymphocytes Relative: 19 %
Lymphs Abs: 1.6 10*3/uL (ref 0.7–4.0)
MCH: 30.3 pg (ref 26.0–34.0)
MCHC: 32.1 g/dL (ref 30.0–36.0)
MCV: 94.2 fL (ref 80.0–100.0)
Monocytes Absolute: 1 10*3/uL (ref 0.1–1.0)
Monocytes Relative: 12 %
Neutro Abs: 5.1 10*3/uL (ref 1.7–7.7)
Neutrophils Relative %: 63 %
Platelets: 600 10*3/uL — ABNORMAL HIGH (ref 150–400)
RBC: 3.27 MIL/uL — ABNORMAL LOW (ref 3.87–5.11)
RDW: 13.7 % (ref 11.5–15.5)
WBC: 8.3 10*3/uL (ref 4.0–10.5)
nRBC: 0 % (ref 0.0–0.2)

## 2019-10-21 MED ORDER — POTASSIUM CHLORIDE CRYS ER 20 MEQ PO TBCR
20.0000 meq | EXTENDED_RELEASE_TABLET | Freq: Two times a day (BID) | ORAL | Status: AC
  Administered 2019-10-21 (×2): 20 meq via ORAL
  Filled 2019-10-21 (×2): qty 1

## 2019-10-21 NOTE — Plan of Care (Signed)
  Problem: RH Ambulation Goal: LTG Patient will ambulate in controlled environment (PT) Description: LTG: Patient will ambulate in a controlled environment, # of feet with assistance (PT). Outcome: Not Applicable Flowsheets (Taken 10/21/2019 1224) LTG: Pt will ambulate in controlled environ  assist needed:: (D/C) -- Note: D/C Goal: LTG Patient will ambulate in home environment (PT) Description: LTG: Patient will ambulate in home environment, # of feet with assistance (PT). Outcome: Not Applicable Flowsheets (Taken 10/21/2019 1224) LTG: Pt will ambulate in home environ  assist needed:: (D/C) -- Note: D/C   Problem: RH Stairs Goal: LTG Patient will ambulate up and down stairs w/assist (PT) Description: LTG: Patient will ambulate up and down # of stairs with assistance (PT) Outcome: Not Applicable Flowsheets (Taken 10/21/2019 1224) LTG: Pt will ambulate up/down stairs assist needed:: (D/C) -- Note: D/C   Problem: RH Car Transfers Goal: LTG Patient will perform car transfers with assist (PT) Description: LTG: Patient will perform car transfers with assistance (PT). Flowsheets (Taken 10/21/2019 1224) LTG: Pt will perform car transfers with assist:: (downgraded due to posterior pushing, decreased balance/postural control, generalized weakness, and poor safety awareness) Moderate Assistance - Patient 50 - 74% Note: downgraded due to posterior pushing, decreased balance/postural control, generalized weakness, and poor safety awareness

## 2019-10-21 NOTE — Plan of Care (Signed)
  Problem: RH Tub/Shower Transfers Goal: LTG Patient will perform tub/shower transfers w/assist (OT) Description: LTG: Patient will perform tub/shower transfers with assist, with/without cues using equipment (OT) Outcome: Not Applicable Flowsheets (Taken 10/21/2019 1514) LTG: Pt will perform tub/shower stall transfers with assistance level of: (D/C as not appropriate at this time) -- Note: D/C as not appropriate at this time due to poor trunk control   Problem: RH Balance Goal: LTG: Patient will maintain dynamic sitting balance (OT) Description: LTG:  Patient will maintain dynamic sitting balance with assistance during activities of daily living (OT) Flowsheets (Taken 10/21/2019 1514) LTG: Pt will maintain dynamic sitting balance during ADLs with: (downgraded) Contact Guard/Touching assist Note: Downgraded due to fluctuations in postural control with posterior bias and Rt lean   Problem: Sit to Stand Goal: LTG:  Patient will perform sit to stand in prep for activites of daily living with assistance level (OT) Description: LTG:  Patient will perform sit to stand in prep for activites of daily living with assistance level (OT) Flowsheets (Taken 10/21/2019 1514) LTG: PT will perform sit to stand in prep for activites of daily living with assistance level: (downgraded) Moderate Assistance - Patient 50 - 74% Note: Downgraded due to fluctuations in postural control with posterior bias and Rt lean   Problem: RH Dressing Goal: LTG Patient will perform lower body dressing w/assist (OT) Description: LTG: Patient will perform lower body dressing with assist, with/without cues in positioning using equipment (OT) Flowsheets (Taken 10/21/2019 1514) LTG: Pt will perform lower body dressing with assistance level of: (downgraded) Moderate Assistance - Patient 50 - 74% Note: Downgraded due to fluctuations in postural control with posterior bias and Rt lean   Problem: RH Toileting Goal: LTG Patient will  perform toileting task (3/3 steps) with assistance level (OT) Description: LTG: Patient will perform toileting task (3/3 steps) with assistance level (OT)  Flowsheets (Taken 10/21/2019 1514) LTG: Pt will perform toileting task (3/3 steps) with assistance level: (downgraded) Moderate Assistance - Patient 50 - 74% Note: Downgraded due to fluctuations in postural control with posterior bias and Rt lean in both sitting and standing   Problem: RH Functional Use of Upper Extremity Goal: LTG Patient will use RT/LT upper extremity as a (OT) Description: LTG: Patient will use right/left upper extremity as a stabilizer/gross assist/diminished/nondominant/dominant level with assist, with/without cues during functional activity (OT) Flowsheets (Taken 10/21/2019 1514) LTG: Pt will use upper extremity in functional activity with assistance level of: Minimal Assistance - Patient > 75%   Problem: RH Toilet Transfers Goal: LTG Patient will perform toilet transfers w/assist (OT) Description: LTG: Patient will perform toilet transfers with assist, with/without cues using equipment (OT) Flowsheets (Taken 10/21/2019 1514) LTG: Pt will perform toilet transfers with assistance level of: (downgraded) Moderate Assistance - Patient 50 - 74% Note: Downgraded due to fluctuations in postural control with posterior bias, Rt lean, and pushing

## 2019-10-21 NOTE — Progress Notes (Signed)
Physical Therapy Session Note  Patient Details  Name: Elizabeth Tapia MRN: [id number] Date of Birth: 11-Jun-1929  Today's Date: 10/21/2019 PT Individual Time: 1000-1055 PT Individual Time Calculation (min): 55 min   Short Term Goals: Week 1:  PT Short Term Goal 1 (Week 1): Pt will perform bed mobility with supervision PT Short Term Goal 1 - Progress (Week 1): Progressing toward goal PT Short Term Goal 2 (Week 1): Pt wil transfer sit<>stand with LRAD min A PT Short Term Goal 2 - Progress (Week 1): Progressing toward goal PT Short Term Goal 3 (Week 1): Pt will perform simulated car transfer with LRAD mod A PT Short Term Goal 3 - Progress (Week 1): Progressing toward goal Week 2:  PT Short Term Goal 1 (Week 2): Pt will perform bed mobility with supervision consistantly PT Short Term Goal 2 (Week 2): Pt wil transfer sit<>stand with LRAD mod A consistantly PT Short Term Goal 3 (Week 2): Pt will perform simulated car transfer with LRAD mod A  Skilled Therapeutic Interventions/Progress Updates:   Received pt sitting in WC, pt agreeable to therapy, and denied any pain during session. Session with emphasis on functional mobility/transfers, generalized strengthening, dynamic standing balance/coordination, NMR, motor control/sequencing, midline orientation, toileting, and improved activity tolerance. Pt transported to ortho gym in [facility] total A for time management purposes and transferred WC<>mat squat<>pivot with max A while supporting R UE. Pt required cues to place feet on floor and for anterior weight shifting, however pt with poor carry over. Pt transferred sit<>stand with max A x 2 trials using mirror for visual feedback with R UE supported around therapist's shoulder and therapist blocking bilateral knees. Pt with strong posterior and R lateral lean and trunk flexion requiring max cues for anterior weight shifting, knee/trunk extension, and upright posture, however pt with poor carry over essentially  sitting on therapist's leg. Pt unable to maintain feet underneath her despite blocking from therapist resulting in pt's feet getting tangled and pt lifting LE's from floor while trying to stand. Transitioned to working on dynamic sitting balance, upright posture, and anterior weight shifting sliding rings across rainbow arc x 2 trials with mirror for visual feedback using L UE with R UE placed on mat for improved weight bearing. Pt demonstrated improvements in weight shifting with activity but poor carry over to standing. Worked on anterior weight shifting "rocking" forwards while sitting on mat x 8 reps with min A. Pt transferred sit<>stand max A with therapist in front of pt this time with pt's arms supported on therapist's shoulders and therapist manually facilitating placing feet behind her. Pt with continued strong posterior lean and unable to follow cues for anterior weight shifting resulting in therapist bear hugging pt to achieve upright posture. Returned to sitting and pt reported urge to use restroom. Mat<>WC squat<>pivot max A and pt transported back to room in [facility] total A. Pt transferred WC<>toilet with bedside commode over top with Stedy dependently due to fatigue. Pt required max cues for anterior weight shifting to reach for Stedy bar and for hip extension and total A to doff pants/brief. Pt able to void and with small BM. Pt required total A for peri-care while standing and total A to pull pants/brief over hips. Dependent transfer with Stedy back to WC. Concluded session with pt sitting in WC, needs within reach, and seatbelt alarm on. Half lap tray donned for improved hemibody positioning.   Therapy Documentation Precautions:  Precautions Precautions: Fall Precaution Comments: R hemiparesis Restrictions Weight Bearing  Restrictions: No   Therapy/Group: Individual Therapy Martin Majestic PT, DPT   10/21/2019, 7:24 AM

## 2019-10-21 NOTE — Progress Notes (Signed)
Occupational Therapy Session Note  Patient Details  Name: Elizabeth Tapia MRN: [id number] Date of Birth: [birth year]  Today's Date: 10/21/2019 OT Individual Time: 3295-1884 OT Individual Time Calculation (min): 46 min  and Today's Date: 10/21/2019 OT Missed Time: 29 Minutes Missed Time Reason: Patient fatigue   Short Term Goals: Week 2:  OT Short Term Goal 1 (Week 2): Pt will complete LB dressing with max assist at sit > stand level OT Short Term Goal 2 (Week 2): Pt will complete toilet transfers max assist OT Short Term Goal 3 (Week 2): Pt will complete bathing with mod assist at sit > stand level OT Short Term Goal 4 (Week 2): Pt will utilize RUE as gross assist during self-care tasks  Skilled Therapeutic Interventions/Progress Updates:    Treatment session with focus on transfer training, trunk control, postural control in sitting and standing, and RUE NMR.  Pt received upright in w/c with no c/o pain.  Pt agreeable to therapy session, although stating that she is tired this afternoon.  Therapist set up transfer to transfer initially to Rt to decrease pushing tendencies.  Max multimodal cues and facilitation for anterior weight shift prior to and during transfer, max-total assist for transfer.  Engaged in lateral scoots along edge of mat with focus on trunk control, weight shifting, and problem solving, sequencing, and motor planning body movements.  Therapist providing facilitation at hips and trunk for anterior weight shift.  Engaged in sit > stand from mat with use of mirror for visual feedback of upright posture, max assist sit > stand.  Therapist providing multimodal cues as well as cue for "nose over toes" with increased carryover of weight shift, still requiring max assist. Engaged in upright standing with facilitation for maintain upright posture while removing pegs with Lt hand and placing in container on Lt to further facilitate weight shift to more upright posture (as pt with Rt lean  and posterior bias in standing).  Engaged in RUE NMR with focus on UE activation of shoulder flexion/extension and horizontal abduction/adduction with UE Ranger and facilitation at elbow.  Pt reports fatigue and despite given rest breaks and alternating sitting and standing tasks during session, pt requesting to return to room. Completed squat pivot transfer mat > w/c > bed with over the back technique to facilitate weight shifting and decrease pushing, with max-total assist for transfer but improved control of movement.  Noted tone in RUE at elbow and wrist, engaged in PROM/stretching and positioned on pillow for improved positioning.   Therapy Documentation Precautions:  Precautions Precautions: Fall Precaution Comments: R hemiparesis Restrictions Weight Bearing Restrictions: No General: General OT Amount of Missed Time: 29 Minutes Vital Signs: Therapy Vitals Temp: 98.5 F (36.9 C) Pulse Rate: 75 Resp: 20 BP: 139/66 Patient Position (if appropriate): Sitting Oxygen Therapy SpO2: 100 % O2 Device: Room Air Pain:  Pt with no c/o pain   Therapy/Group: Individual Therapy  Rosalio Loud 10/21/2019, 2:25 PM

## 2019-10-21 NOTE — Progress Notes (Signed)
[facility] PHYSICAL MEDICINE & REHABILITATION PROGRESS NOTE  Subjective/Complaints: Patient seen sitting up in her chair this AM, working with therapies.  She states she slept well overnight.  No changes per therapies. She denies abdominal pain.  ROS: Denies CP, SOB, N/V/D  Objective: Vital Signs: Blood pressure (!) 150/64, pulse 74, temperature 98.3 F (36.8 C), resp. rate 18, height 5\' 2"  (1.575 m), weight 43.6 kg, SpO2 98 %. No results found. Recent Labs    10/20/19 0435 10/21/19 0700  WBC 8.8 8.3  HGB 9.9* 9.9*  HCT 31.0* 30.8*  PLT 557* 600*   Recent Labs    10/21/19 0700  NA 138  K 3.4*  CL 104  CO2 25  GLUCOSE 106*  BUN 16  CREATININE 0.98  CALCIUM 8.3*    Intake/Output Summary (Last 24 hours) at 10/21/2019 1023 Last data filed at 10/21/2019 0911 Gross per 24 hour  Intake 180 ml  Output --  Net 180 ml        Physical Exam: BP (!) 150/64   Pulse 74   Temp 98.3 F (36.8 C)   Resp 18   Ht 5\' 2"  (1.575 m)   Wt 43.6 kg   SpO2 98%   BMI 17.58 kg/m  Constitutional: No distress . Vital signs reviewed. HENT: Normocephalic.  Atraumatic. Eyes: EOMI. No discharge. Cardiovascular: No JVD.  RRR. Respiratory: Normal effort.  No stridor.  Bilateral clear to auscultation. GI: Non-distended.  BS +. Skin: Warm and dry.  Intact. Psych: Normal mood.  Normal behavior. Musc: No edema or tenderness in extremities. Motor: RUE: Shoulder abduction 2+/5, elbow flexion/extension 2/5, handgrip 1+/5- stable Right lower extremity: Hip flexion, knee extension 3-/5, ankle dorsiflexion 4+/5-unchanged  Assessment/Plan: 1. Functional deficits secondary to left frontoparietal infarct which require 3+ hours per day of interdisciplinary therapy in a comprehensive inpatient rehab setting.  Physiatrist is providing close team supervision and 24 hour management of active medical problems listed below.  Physiatrist and rehab team continue to assess barriers to discharge/monitor  patient progress toward functional and medical goals   Care Tool:  Bathing  Bathing activity did not occur: Refused Body parts bathed by patient: Right arm, Left arm, Chest, Abdomen, Face, Right upper leg, Left upper leg   Body parts bathed by helper: Right lower leg, Left lower leg     Bathing assist Assist Level: Maximal Assistance - Patient 24 - 49%     Upper Body Dressing/Undressing Upper body dressing   What is the patient wearing?: Button up shirt    Upper body assist Assist Level: Maximal Assistance - Patient 25 - 49%    Lower Body Dressing/Undressing Lower body dressing      What is the patient wearing?: Pants     Lower body assist Assist for lower body dressing: Maximal Assistance - Patient 25 - 49%     Toileting Toileting    Toileting assist Assist for toileting: Total Assistance - Patient < 25%     Transfers Chair/bed transfer  Transfers assist  Chair/bed transfer activity did not occur: Safety/medical concerns  Chair/bed transfer assist level: Total Assistance - Patient < 25%     Locomotion Ambulation   Ambulation assist   Ambulation activity did not occur: Safety/medical concerns (R hemi, fatigue,generalized weakness, decresed balance/postural control)          Walk 10 feet activity   Assist  Walk 10 feet activity did not occur: Safety/medical concerns (R hemi, fatigue,generalized weakness, decresed balance/postural control)  Walk 50 feet activity   Assist Walk 50 feet with 2 turns activity did not occur: Safety/medical concerns (R hemi, fatigue,generalized weakness, decresed balance/postural control)         Walk 150 feet activity   Assist Walk 150 feet activity did not occur: Safety/medical concerns (R hemi, fatigue,generalized weakness, decresed balance/postural control)         Walk 10 feet on uneven surface  activity   Assist Walk 10 feet on uneven surfaces activity did not occur: Safety/medical concerns  (R hemi, fatigue,generalized weakness, decresed balance/postural control)         Wheelchair     Assist Will patient use wheelchair at discharge?: Yes Type of Wheelchair: Manual    Wheelchair assist level: Moderate Assistance - Patient 50 - 74% Max wheelchair distance: 73ft    Wheelchair 50 feet with 2 turns activity    Assist        Assist Level: Total Assistance - Patient < 25%   Wheelchair 150 feet activity     Assist      Assist Level: Dependent - Patient 0%    Medical Problem List and Plan: 1.  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 Pt s/p left CEA on 10/04/19.  Continue CIR 2.  Antithrombotics: -DVT/anticoagulation:  Pharmaceutical:  Heparin DC'd due to hemoglobin             -antiplatelet therapy: ASA/Plavix held initially, aspirin 81 restarted on 10/14 3. Pain Management:   As needed medications 4. Mood: LCSW to follow for evaluation and support.              -antipsychotic agents: N/A 5. Neuropsych: This patient is capable of making decisions on her own behalf. 6. Skin/Wound Care: Monitor incision for healing. Routine pressure relief measures.  7. Fluids/Electrolytes/Nutrition: Monitor I/Os.   Trial Marinol 8. HTN:   Inderal 60 mg bid  Monitor with increased mobility   Vitals:   10/20/19 1917 10/21/19 0425  BP: 140/62 (!) 150/64  Pulse: 85 74  Resp: 16 18  Temp: 99.1 F (37.3 C) 98.3 F (36.8 C)  SpO2: 100% 98%   Labile on 10/18, monitor for trend 9.  CKD IIIa/b: Encourage fluid intake.   Creatinine 0.98 on 10/18  Echo reviewed, EF 70-7 5%, IVF nightly started on 10/11  Continue to monitor 10. ABLA:   Hemoglobin 9.9 on 10/18  Transfuse 2 units of PRBCs on 10/11  Continue to monitor 11. Anxiety/depression:  On valium bid to help manage anxiety state.   Fluoxetine DC'd due to Plavix 12. IBS--diarrhea: Questran on hold 13.  Hyponatremia  Sodium  138 on 10/13  Continue to monitor 14.  Severe hypoalbuminemia  Supplement initiated on 10/5 15. BMI 16.77- dietary consult for assess nutritional needs.  16.  Post stroke dysphagia  Downgraded to D1 nectar  Advance diet as tolerated  17.  Acute lower UTI +/- PNA  WBC within normal limits on 10/13  Afebrile  UA equivocal, urine culture showing E. coli  Chest x-ray personally reviewed, showing left opacity,?  Atelectasis vs PNA  Cefepime changed to cefdinir 18.  Left rectus hematoma/?  Retroperitoneal hematoma/?  Aortic bleeding  Seen on abdominal CT and discussed with radiology  Appreciate vascular recs -no interventions recommended at this time, can continue to monitor  Stable/Improving  LOS: 14 days A FACE TO FACE EVALUATION WAS PERFORMED  Ferdie Bakken Karis Juba 10/21/2019, 10:23 AM

## 2019-10-21 NOTE — Progress Notes (Signed)
Occupational Therapy Session Note  Patient Details  Name: Elizabeth Tapia MRN: [id number] Date of Birth: 1929-10-26  Today's Date: 10/21/2019 OT Individual Time: 2119-4174 OT Individual Time Calculation (min): 71 min    Short Term Goals: Week 2:  OT Short Term Goal 1 (Week 2): Pt will complete LB dressing with max assist at sit > stand level OT Short Term Goal 2 (Week 2): Pt will complete toilet transfers max assist OT Short Term Goal 3 (Week 2): Pt will complete bathing with mod assist at sit > stand level OT Short Term Goal 4 (Week 2): Pt will utilize RUE as gross assist during self-care tasks  Skilled Therapeutic Interventions/Progress Updates:    Treatment session with focus on functional transfers, dynamic sitting balance, standing tolerance, and self-care retraining with self-feeding, bathing, and dressing. Pt received supine in bed with nurse tech completing hygiene at bed level.  Pt agreeable to getting OOB for breakfast.  Rolling to Rt with cues for sequencing and mod assist to come to sitting at EOB.  Total assist squat pivot transfer with facilitation to decrease pushing.  Engaged in self-feeding with pt demonstrating good carryover of swallow strategies.  Noted some shakiness/tremors in Lt hand when bringing cup to mouth. Engaged in bathing and dressing at sink side.  Therapist providing hand over hand assist to facilitate increased functional use of RUE during UB bathing.  Completed sit > stand at sink with mod-max assist with pt utilizing UE support on sink and mirror for visual feedback for increased upright posture and anterior weight shift when upright.  Educated on hemi-dressing technique with UB and LB dressing. Pt required max assist for threading RUE and pulling shirt around back.  Required assistance to thread BLE and pull pants over hips. Pt completed oral care with setup assist.  Pt remained upright in w/c with seat belt alarm on, lap tray for positioning of RUE, and all needs  in reach.  Therapy Documentation Precautions:  Precautions Precautions: Fall Precaution Comments: R hemiparesis Restrictions Weight Bearing Restrictions: No Pain:  Pt with no c/o pain   Therapy/Group: Individual Therapy  Rosalio Loud 10/21/2019, 8:46 AM

## 2019-10-22 ENCOUNTER — Inpatient Hospital Stay (HOSPITAL_COMMUNITY): Payer: Medicare Other | Admitting: Occupational Therapy

## 2019-10-22 ENCOUNTER — Inpatient Hospital Stay (HOSPITAL_COMMUNITY): Payer: Medicare Other

## 2019-10-22 DIAGNOSIS — E43 Unspecified severe protein-calorie malnutrition: Secondary | ICD-10-CM

## 2019-10-22 LAB — CBC WITH DIFFERENTIAL/PLATELET
Abs Immature Granulocytes: 0.03 10*3/uL (ref 0.00–0.07)
Basophils Absolute: 0.1 10*3/uL (ref 0.0–0.1)
Basophils Relative: 1 %
Eosinophils Absolute: 0.5 10*3/uL (ref 0.0–0.5)
Eosinophils Relative: 6 %
HCT: 30.4 % — ABNORMAL LOW (ref 36.0–46.0)
Hemoglobin: 9.4 g/dL — ABNORMAL LOW (ref 12.0–15.0)
Immature Granulocytes: 0 %
Lymphocytes Relative: 17 %
Lymphs Abs: 1.5 10*3/uL (ref 0.7–4.0)
MCH: 28.9 pg (ref 26.0–34.0)
MCHC: 30.9 g/dL (ref 30.0–36.0)
MCV: 93.5 fL (ref 80.0–100.0)
Monocytes Absolute: 1.2 10*3/uL — ABNORMAL HIGH (ref 0.1–1.0)
Monocytes Relative: 13 %
Neutro Abs: 5.6 10*3/uL (ref 1.7–7.7)
Neutrophils Relative %: 63 %
Platelets: 578 10*3/uL — ABNORMAL HIGH (ref 150–400)
RBC: 3.25 MIL/uL — ABNORMAL LOW (ref 3.87–5.11)
RDW: 13.9 % (ref 11.5–15.5)
WBC: 9 10*3/uL (ref 4.0–10.5)
nRBC: 0 % (ref 0.0–0.2)

## 2019-10-22 NOTE — Plan of Care (Signed)
  Problem: RH Problem Solving Goal: LTG Patient will demonstrate problem solving for (SLP) Description: LTG:  Patient will demonstrate problem solving for basic/complex daily situations with cues  (SLP) Flowsheets (Taken 10/22/2019 1549) LTG: Patient will demonstrate problem solving for (SLP): (basic to mildly complex) Basic daily situations LTG Patient will demonstrate problem solving for: (downgraded due to slow progress and medical changes) Minimal Assistance - Patient > 75% Note: Downgraded due to slow progress and medical changes   Problem: RH Memory Goal: LTG Patient will use memory compensatory aids to (SLP) Description: LTG:  Patient will use memory compensatory aids to recall biographical/new, daily complex information with cues (SLP) Flowsheets (Taken 10/22/2019 1549) LTG: Patient will use memory compensatory aids to (SLP): (downgraded due to slow progress and medical changes) Minimal Assistance - Patient > 75% Note: Downgraded due to slow progress and medical changes   Problem: RH Attention Goal: LTG Patient will demonstrate this level of attention during functional activites (SLP) Description: LTG:  Patient will will demonstrate this level of attention during functional activites (SLP) Flowsheets (Taken 10/22/2019 1549) LTG: Patient will demonstrate this level of attention during cognitive/linguistic activities with assistance of (SLP): (downgraded due to slow progress and medical changes) Minimal Assistance - Patient > 75% Number of minutes patient will demonstrate attention during cognitive/linguistic activities: 20 minutes Note: Downgraded due to slow progress and medical changes    Problem: RH Awareness Goal: LTG: Patient will demonstrate awareness during functional activites type of (SLP) Description: LTG: Patient will demonstrate awareness during functional activites type of (SLP) Flowsheets (Taken 10/22/2019 1549) LTG: Patient will demonstrate awareness during  cognitive/linguistic activities with assistance of (SLP): (downgraded due to slow progress and medical changes) Minimal Assistance - Patient > 75% Note: Downgraded due to slow progress and medical changes   Problem: RH Expression Communication Goal: LTG Patient will increase speech intelligibility (SLP) Description: LTG: Patient will increase speech intelligibility at word/phrase/conversation level with cues, % of the time (SLP) Flowsheets (Taken 10/22/2019 1549) LTG: Patient will increase speech intelligibility (SLP): (downgraded due to slow progress and medical changes) Minimal Assistance - Patient > 75% Note: Downgraded due to slow progress and medical changes

## 2019-10-22 NOTE — Progress Notes (Signed)
Occupational Therapy Weekly Progress Note  Patient Details  Name: Elizabeth Tapia MRN: [id number] Date of Birth: 1929/02/21  Beginning of progress report period: October 15, 2019 End of progress report period: October 22, 2019  Today's Date: 10/22/2019 OT Individual Time: 7353-2992 OT Individual Time Calculation (min): 71 min    Patient has met 2 of 4 short term goals.  Pt is making slow progress towards goals.  Pt currently continues to demonstrate posterior lean, lean to Rt during sitting and standing, and pushing tendencies during squat pivot transfers.  Have begun increased focus on trunk control and positioning during sitting, standing, and transfers to decrease burden of care.  Pt continues to benefit from use of mirror for visual feedback for trunk control and UE support when standing.  Pt is demonstrating increased facial and LUE tremors and tone in RUE (especially at elbow and wrist).  Pt is currently requiring max-total assist for squat pivot transfers, max assist for LB and UB dressing, and mod-max assist bathing.  Pt will require extensive hands on assistance with self-care tasks and functional transfers due to presence of pushing and decreased trunk control.  Pt would benefit from hands on education prior to d/c home to allow for family members to begin learning techniques to provide care and facilitate continued improvements.  Patient continues to demonstrate the following deficits: muscle weakness,decreased cardiorespiratoy endurance,impaired timing and sequencing, abnormal tone, unbalanced muscle activation, decreased coordination and decreased motor planning,decreased awareness, decreased problem solving, decreased safety awareness and decreased memoryand decreased sitting balance, decreased standing balance, decreased postural control, hemiplegia and decreased balance strategies and therefore will continue to benefit from skilled OT intervention to enhance overall performance with  BADL and Reduce care partner burden.  Patient not progressing toward long term goals.  See goal revision..  Plan of care revisions: downgraded to mod assist overall due to decreased trunk control and pushing tendencies with transfers and sitting balance.  OT Short Term Goals Week 2:  OT Short Term Goal 1 (Week 2): Pt will complete LB dressing with max assist at sit > stand level OT Short Term Goal 1 - Progress (Week 2): Met OT Short Term Goal 2 (Week 2): Pt will complete toilet transfers max assist OT Short Term Goal 2 - Progress (Week 2): Progressing toward goal OT Short Term Goal 3 (Week 2): Pt will complete bathing with mod assist at sit > stand level OT Short Term Goal 4 (Week 2): Pt will utilize RUE as gross assist during self-care tasks OT Short Term Goal 4 - Progress (Week 2): Progressing toward goal Week 3:  OT Short Term Goal 1 (Week 3): Pt will complete toilet transfers max assist OT Short Term Goal 2 (Week 3): Pt will don pants with mod assist at sit > stand level OT Short Term Goal 3 (Week 3): Pt will utilize RUE as gross assist during self-care tasks OT Short Term Goal 4 (Week 3): Pt will don shirt with mod assist  Skilled Therapeutic Interventions/Progress Updates:    Treatment session with focus on functional mobility, transfers, trunk control, and RUE NMR.  Pt received supine in bed agreeable to therapy session.  Engaged in hygiene at bed level due to incontinent of bladder.  Pt able to roll Rt and Lt with use of bed rails and min assist to allow therapist to complete hygiene and don clean brief.  Therapist donned pants at bed level with pt rolling and bridging to pull pants over hips with increased time and cues.  Therapist encouraged pt to attempt to complete bed mobility to sit to EOB.  Pt required increased time and multimodal cues for sequencing of bed mobility, ultimately min assist for trunk control during sidelying to sit.  MD arrived for morning rounds.  Completed squat  pivot transfer with max multimodal cues for sequencing and max assist with pt initiating weight shift and lift off during transfer.  Pt set up for breakfast, completed self-feeding with use of LUE as only one cough while adhering to SLP swallow strategies.  Engaged in [place] with focus on trunk control, weight shifting during transfers with pt able to complete squat pivot transfers Rt and Lt with max assist w/c <> therapy mat.  Engaged in lateral scoots along edge of mat to address weight shifting and trunk control as needed for transfers.  Engaged in sit > stand with use of mirror for visual feedback and table for UE support during sit <> stand to facilitate increased upright standing posture.  Therapist providing cues for weight shift and facilitation at hips and buttocks to decrease posterior lean.  Pt able to utilize mirror for feedback to increase upright posture with mod assist.  Therapist incorporated RUE stretches and PROM during standing with focus on elbow ROM.  Pt returned to room and remained upright in w/c with seat belt alarm on, half lap tray for improved positioning of RUE, and all needs in reach.  Therapy Documentation Precautions:  Precautions Precautions: Fall Precaution Comments: R hemiparesis Restrictions Weight Bearing Restrictions: No General:   Vital Signs: Therapy Vitals Temp: 97.9 F (36.6 C) Pulse Rate: 82 Resp: 16 BP: (!) 148/65 Patient Position (if appropriate): Lying Oxygen Therapy SpO2: 97 % Pain:   Pt with no c/o pain   Therapy/Group: Individual Therapy  Simonne Come 10/22/2019, 7:22 AM

## 2019-10-22 NOTE — Progress Notes (Signed)
Physical Therapy Weekly Progress Note  Patient Details  Name: Elizabeth Tapia MRN: [id number] Date of Birth: [birth year]  Beginning of progress report period: October 08, 2019 End of progress report period: October 22, 2019  Patient has met 0 of 3 short term goals. Pt demonstrates minimal progress towards long term goals. Goals recently downgraded to mod A overall. Pt currently requires min/mod A for bed mobility and max/total A for transfers. However, pt able to transfer sit<>stand with as little as mod A with stable surface in front of her. Have not worked on gait recently due to safety concerns secondary to pt's tendency to push posteriorly and to the right but have worked on pre-gait stepping during therapy. Pt also continues to demonstrate decreased motor control/sequencing and decreased awareness. Pt would benefit from family education training sessions to discuss plan for discharge and hands on practice with transfers.   Patient continues to demonstrate the following deficits muscle weakness, impaired timing and sequencing, unbalanced muscle activation, decreased coordination and decreased motor planning, decreased awareness, decreased problem solving, decreased safety awareness and decreased memory and decreased sitting balance, decreased standing balance, decreased postural control, hemiplegia and decreased balance strategies and therefore will continue to benefit from skilled PT intervention to increase functional independence with mobility.  Patient not progressing toward long term goals.  See goal revision..  Plan of care revisions: long term goals recently downgraded to mod A overall due to poor standing balance and tendency for pt to push posteriorly.   PT Short Term Goals Week 2:  PT Short Term Goal 1 (Week 2): Pt will perform bed mobility with supervision consistantly PT Short Term Goal 1 - Progress (Week 2): Progressing toward goal PT Short Term Goal 2 (Week 2): Pt wil transfer  sit<>stand with LRAD mod A consistantly PT Short Term Goal 2 - Progress (Week 2): Progressing toward goal PT Short Term Goal 3 (Week 2): Pt will perform simulated car transfer with LRAD mod A PT Short Term Goal 3 - Progress (Week 2): Not progressing Week 3:  PT Short Term Goal 1 (Week 3): STG=LTG due to LOS  Skilled Therapeutic Interventions/Progress Updates:  Ambulation/gait training;Discharge planning;Functional mobility training;Psychosocial support;Therapeutic Activities;Balance/vestibular training;Disease management/prevention;Neuromuscular re-education;Skin care/wound management;Therapeutic Exercise;Wheelchair propulsion/positioning;Cognitive remediation/compensation;DME/adaptive equipment instruction;Pain management;Splinting/orthotics;UE/LE Strength taining/ROM;Community reintegration;Functional electrical stimulation;Patient/family education;Stair training;UE/LE Coordination activities   Therapy Documentation Precautions:  Precautions Precautions: Fall Precaution Comments: R hemiparesis Restrictions Weight Bearing Restrictions: No  Therapy/Group: Individual Therapy Alfonse Alpers PT, DPT   10/22/2019, 7:26 AM

## 2019-10-22 NOTE — Progress Notes (Signed)
[facility] PHYSICAL MEDICINE & REHABILITATION PROGRESS NOTE  Subjective/Complaints: Patient seen transferring with therapies to edge of bed this morning.  Poor sitting balance noted.  Discussed increased attention and mild increase in pushing with therapies as well as tremors.  ROS: Denies CP, SOB, N/V/D  Objective: Vital Signs: Blood pressure (!) 148/65, pulse 82, temperature 97.9 F (36.6 C), resp. rate 16, height 5\' 2"  (1.575 m), weight 43.6 kg, SpO2 97 %. No results found. Recent Labs    10/21/19 0700 10/22/19 0653  WBC 8.3 9.0  HGB 9.9* 9.4*  HCT 30.8* 30.4*  PLT 600* 578*   Recent Labs    10/21/19 0700  NA 138  K 3.4*  CL 104  CO2 25  GLUCOSE 106*  BUN 16  CREATININE 0.98  CALCIUM 8.3*    Intake/Output Summary (Last 24 hours) at 10/22/2019 0857 Last data filed at 10/22/2019 0845 Gross per 24 hour  Intake 400 ml  Output 200 ml  Net 200 ml        Physical Exam: BP (!) 148/65   Pulse 82   Temp 97.9 F (36.6 C)   Resp 16   Ht 5\' 2"  (1.575 m)   Wt 43.6 kg   SpO2 97%   BMI 17.58 kg/m  Constitutional: No distress . Vital signs reviewed. HENT: Normocephalic.  Atraumatic. Eyes: EOMI. No discharge. Cardiovascular: No JVD.  RRR. Respiratory: Normal effort.  No stridor.  Bilateral clear to auscultation. GI: Non-distended.  BS +. Skin: Warm and dry.  Intact. Psych: Normal mood.  Normal behavior. Musc: No edema in extremities.  No tenderness in extremities. Neuro: Alert Resting head tremor Motor: RUE: Shoulder abduction 2+/5, elbow flexion/extension 2/5, handgrip 1+/5, unchanged Right lower extremity: Hip flexion, knee extension 3-/5, ankle dorsiflexion 4+/5-unchanged  Assessment/Plan: 1. Functional deficits secondary to left frontoparietal infarct which require 3+ hours per day of interdisciplinary therapy in a comprehensive inpatient rehab setting.  Physiatrist is providing close team supervision and 24 hour management of active medical problems  listed below.  Physiatrist and rehab team continue to assess barriers to discharge/monitor patient progress toward functional and medical goals   Care Tool:  Bathing  Bathing activity did not occur: Refused Body parts bathed by patient: Right arm, Left arm, Chest, Abdomen, Face, Right upper leg, Left upper leg   Body parts bathed by helper: Right lower leg, Left lower leg     Bathing assist Assist Level: Maximal Assistance - Patient 24 - 49%     Upper Body Dressing/Undressing Upper body dressing   What is the patient wearing?: Button up shirt    Upper body assist Assist Level: Maximal Assistance - Patient 25 - 49%    Lower Body Dressing/Undressing Lower body dressing      What is the patient wearing?: Pants     Lower body assist Assist for lower body dressing: Maximal Assistance - Patient 25 - 49%     Toileting Toileting    Toileting assist Assist for toileting: Total Assistance - Patient < 25%     Transfers Chair/bed transfer  Transfers assist  Chair/bed transfer activity did not occur: Safety/medical concerns  Chair/bed transfer assist level: Total Assistance - Patient < 25%     Locomotion Ambulation   Ambulation assist   Ambulation activity did not occur: Safety/medical concerns (R hemi, fatigue,generalized weakness, decresed balance/postural control)          Walk 10 feet activity   Assist  Walk 10 feet activity did not occur: Safety/medical concerns (  R hemi, fatigue,generalized weakness, decresed balance/postural control)        Walk 50 feet activity   Assist Walk 50 feet with 2 turns activity did not occur: Safety/medical concerns (R hemi, fatigue,generalized weakness, decresed balance/postural control)         Walk 150 feet activity   Assist Walk 150 feet activity did not occur: Safety/medical concerns (R hemi, fatigue,generalized weakness, decresed balance/postural control)         Walk 10 feet on uneven surface   activity   Assist Walk 10 feet on uneven surfaces activity did not occur: Safety/medical concerns (R hemi, fatigue,generalized weakness, decresed balance/postural control)         Wheelchair     Assist Will patient use wheelchair at discharge?: Yes Type of Wheelchair: Manual    Wheelchair assist level: Moderate Assistance - Patient 50 - 74% Max wheelchair distance: 77ft    Wheelchair 50 feet with 2 turns activity    Assist        Assist Level: Total Assistance - Patient < 25%   Wheelchair 150 feet activity     Assist      Assist Level: Dependent - Patient 0%    Medical Problem List and Plan: 1.  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 Pt s/p left CEA on 10/04/19.  Continue CIR 2.  Antithrombotics: -DVT/anticoagulation:  Pharmaceutical:  Heparin DC'd due to hemoglobin             -antiplatelet therapy: ASA/Plavix held initially, aspirin 81 restarted on 10/14 3. Pain Management:   As needed medications 4. Mood: LCSW to follow for evaluation and support.              -antipsychotic agents: N/A 5. Neuropsych: This patient is capable of making decisions on her own behalf. 6. Skin/Wound Care: Monitor incision for healing. Routine pressure relief measures.  7. Fluids/Electrolytes/Nutrition: Monitor I/Os.   Trial Marinol-appetite improving 8. HTN:   Inderal 60 mg bid  Monitor with increased mobility   Vitals:   10/21/19 1930 10/22/19 0557  BP: 137/65 (!) 148/65  Pulse: 81 82  Resp: 16 16  Temp: 98.9 F (37.2 C) 97.9 F (36.6 C)  SpO2: 99% 97%   Mildly elevated on 10/19 9.  CKD IIIa/b: Encourage fluid intake.   Creatinine 0.98 on 10/18  Echo reviewed, EF 70-7 5%, IVF nightly started on 10/11  Continue to monitor 10. ABLA:   Hemoglobin 9.4 on 10/19  Transfuse 2 units of PRBCs on 10/11  Continue to monitor 11. Anxiety/depression:  On valium bid to help manage  anxiety state.   Fluoxetine DC'd due to Plavix 12. IBS--diarrhea: Questran on hold 13.  Hyponatremia  Sodium 138 on 10/13  Continue to monitor 14.  Severe hypoalbuminemia  Supplement initiated on 10/5 15. BMI 16.77- dietary consult for assess nutritional needs.  16.  Post stroke dysphagia  Downgraded to D1 nectar  Advance diet as tolerated  17.  Acute lower UTI +/- PNA  WBC within normal limits on 10/13  Afebrile  UA equivocal, urine culture showing E. coli  Chest x-ray personally reviewed, showing left opacity,?  Atelectasis vs PNA  Cefepime changed to cefdinir 18.  Left rectus hematoma/?  Retroperitoneal hematoma/?  Aortic bleeding  Seen on abdominal CT and discussed with radiology  Appreciate vascular recs -no interventions recommended at this time, can continue to monitor  Stable 19.  Tremor  Patient states she has had tremor for  years  LOS: 15 days A FACE TO FACE EVALUATION WAS PERFORMED  Amil Bouwman Karis Juba 10/22/2019, 8:57 AM

## 2019-10-22 NOTE — Progress Notes (Signed)
Physical Therapy Session Note  Patient Details  Name: Elizabeth Tapia MRN: [id number] Date of Birth: [birth year]  Today's Date: 10/22/2019 PT Individual Time: 1015-1056 and 8315-1761  PT Individual Time Calculation (min): 41 min and 24 min  Short Term Goals: Week 2:  PT Short Term Goal 1 (Week 2): Pt will perform bed mobility with supervision consistantly PT Short Term Goal 1 - Progress (Week 2): Progressing toward goal PT Short Term Goal 2 (Week 2): Pt wil transfer sit<>stand with LRAD mod A consistantly PT Short Term Goal 2 - Progress (Week 2): Progressing toward goal PT Short Term Goal 3 (Week 2): Pt will perform simulated car transfer with LRAD mod A PT Short Term Goal 3 - Progress (Week 2): Not progressing Week 3:  PT Short Term Goal 1 (Week 3): STG=LTG due to LOS  Skilled Therapeutic Interventions/Progress Updates:  Ambulation/gait training;Discharge planning;Functional mobility training;Psychosocial support;Therapeutic Activities;Balance/vestibular training;Disease management/prevention;Neuromuscular re-education;Skin care/wound management;Therapeutic Exercise;Wheelchair propulsion/positioning;Cognitive remediation/compensation;DME/adaptive equipment instruction;Pain management;Splinting/orthotics;UE/LE Strength taining/ROM;Community reintegration;Functional electrical stimulation;Patient/family education;Stair training;UE/LE Coordination activities   Today's Interventions: Treatment Session 1: 1015-1056 41 min Received pt sitting in WC, pt agreeable to therapy, and denied any pain during session. Session with emphasis on functional mobility/transfers, generalized strengthening, dynamic standing balance/coordination, pre-gait training, NMR, motor control/sequencing, and improved activity tolerance. Pt transported to dayroom in [facility] total A for time management purposes. Pt transferred sit<>stand with mod A at table in dayroom x 2 trials using mirror for visual feedback with cues for scooting  hips forward in WC, anterior weight shfiting, foot placement, and trunk extension. Pt demonstrated improvements following instructions and able to maintain static standing balance with CGA with bilateral UE support. While standing worked on dynamic standing balance, reaching outside BOS, and lateral weight shifting to stack and unstack cones on either side of the table. R UE supported on table for improved weight bearing. Pt with tendency to flex trunk with activity requring max cues/reminders for upright gaze to look in mirror. Pt transferred sit<>stand x 2 additional trials at table in dayroom with mod A with same cues mentioned above and worked on pre-gait stepping with R LE 2x8 reps. Pt required cues for weight shifting and manual facilitation from therapist to shift weight laterally to the R and weight bear through R LE. With repetition pt with good carry over and able to weight shift onto R LE with less manual assist. Pt transported back to room in [facility] total A. Concluded session with pt sitting in WC, needs within reach, and seatbelt alarm on. Half lap tray donned and friend present at bedside.    Treatment Session 2: 1300-1324 24 min Received pt sitting in [facility] with friend, Ree Kida, present at bedside, therapist reminded visitor of hospital mask policy. Pt agreeable to therapy and denied any pain during session. Session with emphasis on functional mobility/transfers, generalized strengthening, dynamic sitting balance/coordination, NMR, and improved activity tolerance. Pt transported to dayroom in [facility] total A and worked on blocked practice transfers to/from [facility] to mat x 4 trials with max/total A. Pt required cues for head/hips relationship, anterior weight shifting, foot placement, and to place hands in lap or around therapist's shoulders to avoid pushing tendencies. While sitting on mat worked on lateral reciprocal scooting with max cues for technique. Pt ultimately required min/mod A for scooting and  repositioning in WC due to poor motor planning and difficulty sequencing. Discussed purpose of practicing blocked transfers with the intention to teach pt's son and daughter in law; pt in agreement. Pt transported back  to room in [facility] total A. Concluded session with pt sitting in WC, needs within reach, and seatbelt alarm on. Half lap tray donned and Ree Kida present at bedside.   Therapy Documentation Precautions:  Precautions Precautions: Fall Precaution Comments: R hemiparesis Restrictions Weight Bearing Restrictions: No  Therapy/Group: Individual Therapy Martin Majestic PT, DPT   10/22/2019, 7:48 AM

## 2019-10-22 NOTE — Plan of Care (Signed)
  Problem: RH Balance Goal: LTG Patient will maintain dynamic standing balance (PT) Description: LTG:  Patient will maintain dynamic standing balance with assistance during mobility activities (PT) Flowsheets (Taken 10/22/2019 0728) LTG: Pt will maintain dynamic standing balance during mobility activities with:: (downgraded due to generalized weakness, R hemi, decreased balance/postural control, poor motor planning, and poor endurance) Moderate Assistance - Patient 50 - 74% Note: downgraded due to generalized weakness, R hemi, decreased balance/postural control, poor motor planning, and poor endurance   Problem: Sit to Stand Goal: LTG:  Patient will perform sit to stand with assistance level (PT) Description: LTG:  Patient will perform sit to stand with assistance level (PT) Flowsheets (Taken 10/22/2019 0728) LTG: PT will perform sit to stand in preparation for functional mobility with assistance level: (downgraded due to generalized weakness, R hemi, decreased balance/postural control, poor motor planning, and poor endurance) Moderate Assistance - Patient 50 - 74% Note: downgraded due to generalized weakness, R hemi, decreased balance/postural control, poor motor planning, and poor endurance   Problem: RH Bed to Chair Transfers Goal: LTG Patient will perform bed/chair transfers w/assist (PT) Description: LTG: Patient will perform bed to chair transfers with assistance (PT). Flowsheets (Taken 10/22/2019 0728) LTG: Pt will perform Bed to Chair Transfers with assistance level: (downgraded due to generalized weakness, R hemi, decreased balance/postural control, poor motor planning, and poor endurance) -- Note: downgraded due to generalized weakness, R hemi, decreased balance/postural control, poor motor planning, and poor endurance

## 2019-10-22 NOTE — Progress Notes (Signed)
Speech Language Pathology Weekly Progress and Session Note  Patient Details  Name: Elizabeth Tapia MRN: [id number] Date of Birth: 06-27-1929  Beginning of progress report period: October 14, 2019 End of progress report period: October 22, 2019  Today's Date: 10/22/2019 SLP Individual Time: 1435-1530 SLP Individual Time Calculation (min): 55 min  Short Term Goals: Week 2: SLP Short Term Goal 1 (Week 2): Pt will demonstrate sustained attention in 30 minute intervals with min A verbal cues during mildly complex tasks. SLP Short Term Goal 1 - Progress (Week 2): Met SLP Short Term Goal 2 (Week 2): Pt will demonstrate mildly complex problem solving skills with min A verbal cues. SLP Short Term Goal 2 - Progress (Week 2): Not met SLP Short Term Goal 3 (Week 2): Pt will demonstrate self-monitoring and self-correcting of functional errors in problem solving tasks with min A verbal cues. SLP Short Term Goal 3 - Progress (Week 2): Not met SLP Short Term Goal 4 (Week 2): Pt will demonstrate recall of daily information (including speech and swallow strategies) with min A verbal cues for visual aids. SLP Short Term Goal 4 - Progress (Week 2): Not met SLP Short Term Goal 5 (Week 2): Pt will utilize swallow strategies when consuming dys 1 textures and nectar-thick liquids with minimal overt s/s aspiration and Min A verbal cues. SLP Short Term Goal 5 - Progress (Week 2): Met SLP Short Term Goal 6 (Week 2): Pt will demonstrate 85% intelligibility at sentence level with supervision A verbal cues to increase vocal intensity in a mildly nosiy environment.Marland Kitchen SLP Short Term Goal 6 - Progress (Week 2): Met    New Short Term Goals: Week 3: SLP Short Term Goal 1 (Week 3): STG=LTG due to short ELOS  Weekly Progress Updates:Pt made moderate progress meeting 3 out 6 goals this reporting period. Pt was downgraded to nectar thick liquids due to coughing at beside in the beginning of the reporting period, however was  upgraded again to thin liquids with tolerance at bedside. SLP recommends to provide thin liquids via cup and sitting in upright position (chair/WC is ideal), however downgrade to nectar thick liquids if noted s/s aspiration. Pt demonstrated slow progress improving at the end of the reporting period, therefore SLP downgraded cognitive and speech goals to min A. Education needs to be completed in the remaining reporting period, carryover of speech/swallow strategies and cognitive skills in recall, basic/mildly complex problem solving, safety awareness and sustained attention. Pt would continue to benefit from skilled ST services in order to maximize functional independence and reduce burden of care, requiring 24 hour supervision at discharge with continued skilled ST services.   Intensity: Minumum of 1-2 x/day, 30 to 90 minutes Frequency: 3 to 5 out of 7 days Duration/Length of Stay: 10/26 Treatment/Interventions: Cognitive remediation/compensation;Cueing hierarchy;Dysphagia/aspiration precaution training;Functional tasks;Medication managment;Patient/family education;Internal/external aids   Daily Session  Skilled Therapeutic Interventions: Skilled ST services focused on swallow and cognitive skills. SLP facilitated trials of thin liquids via tsp x2 and cup sips x8. Pt demonstrated only x1 immediate cough with cup sips in bed verse in WC. SLP notified NT to allow thin liquids via cup (sitting in WC is ideal) and downgrade to nectar thick liquids if increase s/s aspiration noted. SLP also facilitated sustained attention, basic problem solving and recall skills in novel card task Blink played at basic level. Pt required max A verbal cues for problem solving and recall of 3 rules. SLP adjusted task to sorting by colors then by shapes in a  field of 6. Pt required mod A verbal cues for problem solving when sorting by shape and mod I for sorting by color, with awareness of increase difficulty when sorting by  shape. Pt requested to get into bed. SLP and NT assisted in transfer via stedy pt required min A verbal cues for safety and mod physical assist. Pt was left in room with call bell within reach and bed alarm set. SLP recommends to continue skilled services.     General    Pain Pain Assessment Pain Score: 0-No pain  Therapy/Group: Individual Therapy  Candido Flott  [facility] 10/22/2019, 3:56 PM

## 2019-10-23 ENCOUNTER — Inpatient Hospital Stay (HOSPITAL_COMMUNITY): Payer: Medicare Other | Admitting: Occupational Therapy

## 2019-10-23 ENCOUNTER — Inpatient Hospital Stay (HOSPITAL_COMMUNITY): Payer: Medicare Other | Admitting: Speech Pathology

## 2019-10-23 ENCOUNTER — Inpatient Hospital Stay (HOSPITAL_COMMUNITY): Payer: Medicare Other

## 2019-10-23 DIAGNOSIS — F411 Generalized anxiety disorder: Secondary | ICD-10-CM

## 2019-10-23 DIAGNOSIS — E876 Hypokalemia: Secondary | ICD-10-CM

## 2019-10-23 LAB — CBC WITH DIFFERENTIAL/PLATELET
Abs Immature Granulocytes: 0.03 10*3/uL (ref 0.00–0.07)
Basophils Absolute: 0.1 10*3/uL (ref 0.0–0.1)
Basophils Relative: 1 %
Eosinophils Absolute: 0.4 10*3/uL (ref 0.0–0.5)
Eosinophils Relative: 6 %
HCT: 30.1 % — ABNORMAL LOW (ref 36.0–46.0)
Hemoglobin: 9.3 g/dL — ABNORMAL LOW (ref 12.0–15.0)
Immature Granulocytes: 0 %
Lymphocytes Relative: 21 %
Lymphs Abs: 1.6 10*3/uL (ref 0.7–4.0)
MCH: 28.8 pg (ref 26.0–34.0)
MCHC: 30.9 g/dL (ref 30.0–36.0)
MCV: 93.2 fL (ref 80.0–100.0)
Monocytes Absolute: 1.1 10*3/uL — ABNORMAL HIGH (ref 0.1–1.0)
Monocytes Relative: 14 %
Neutro Abs: 4.5 10*3/uL (ref 1.7–7.7)
Neutrophils Relative %: 58 %
Platelets: 568 10*3/uL — ABNORMAL HIGH (ref 150–400)
RBC: 3.23 MIL/uL — ABNORMAL LOW (ref 3.87–5.11)
RDW: 13.8 % (ref 11.5–15.5)
WBC: 7.8 10*3/uL (ref 4.0–10.5)
nRBC: 0 % (ref 0.0–0.2)

## 2019-10-23 MED ORDER — DIAZEPAM 2 MG PO TABS
2.0000 mg | ORAL_TABLET | Freq: Every day | ORAL | Status: DC
  Administered 2019-10-24 – 2019-10-28 (×5): 2 mg via ORAL
  Filled 2019-10-23 (×5): qty 1

## 2019-10-23 NOTE — Progress Notes (Signed)
Speech Language Pathology Daily Session Note  Patient Details  Name: Elizabeth Tapia MRN: [id number] Date of Birth: 03-28-1929  Today's Date: 10/23/2019 SLP Individual Time: 0459-9774 SLP Individual Time Calculation (min): 55 min  Short Term Goals: Week 3: SLP Short Term Goal 1 (Week 3): STG=LTG due to short ELOS  Skilled Therapeutic Interventions: Skilled treatment session focused on dysphagia and cognitive goals. SLP facilitated session by providing skilled observation with a snack of Dys. 2 textures. Patient demonstrated efficient mastication with complete oral clearance without overt s/s of aspiration. Recommend trial tray prior to upgrade. Patient also consumed thin liquids via cup without overt s/s of aspiration. SLP also facilitated session by providing Min A verbal cues for problem solving during a 4 step picture sequencing task and Mod A verbal cues for problem solving during a 6 step picture sequencing task. Patient also required total A to recall events from previous therapy sessions. Patient left upright in the wheelchair with alarm on and all needs within reach. Continue with current plan of care.      Pain No/Denies Pain   Therapy/Group: Individual Therapy  Jeston Junkins 10/23/2019, 2:11 PM

## 2019-10-23 NOTE — Progress Notes (Signed)
Occupational Therapy Session Note  Patient Details  Name: Elizabeth Tapia MRN: [id number] Date of Birth: 05-14-1929  Today's Date: 10/23/2019 OT Individual Time: 7096-2836 OT Individual Time Calculation (min): 53 min    Short Term Goals: Week 3:  OT Short Term Goal 1 (Week 3): Pt will complete toilet transfers max assist OT Short Term Goal 2 (Week 3): Pt will don pants with mod assist at sit > stand level OT Short Term Goal 3 (Week 3): Pt will utilize RUE as gross assist during self-care tasks OT Short Term Goal 4 (Week 3): Pt will don shirt with mod assist  Skilled Therapeutic Interventions/Progress Updates:    Treatment session with focus on self-care retraining, dynamic sitting balance, transfers, RUE NMR, and arousal.  Pt received asleep in bed requiring increased time and frequent stimulus to maintain arousal. Discussed with RN lethargy this AM compared to last few days, question morning meds impacting arousal.  Pt agreeable to minimal activity at bed level due to decreased arousal.  Engaged in LB dressing at bed level with focus on bridging and rolling to pull pants over hips.  Pt agreeable to attempts to sit to EOB, required mod assist for rolling and sidelying to sitting.  Noted increased posterior and Rt lean in sitting at EOB, requiring mod assist for sitting balance.  Engaged in UB dressing with focus on hemi-dressing technique and breaking it down step by step.  Pt able to don button up shirt with mod assist overall.  Pt then agreeable to getting OOB.  Completed squat pivot transfer with focus on anterior weight shift and initiating of weight shift, pt requiring total assist due to increased pushing this session.  Engaged in grooming tasks seated at sink with therapist educating on use of RUE as stabilizer with holding toothpaste and toothbrush.  Engaged in Abie of RUE in sitting with support at elbow to facilitate increased ROM and activation of functional movements.  Pt remained  upright in w/c with seat belt alarm on, half lap tray for improved positioning, and all needs in reach.  Therapy Documentation Precautions:  Precautions Precautions: Fall Precaution Comments: R hemiparesis Restrictions Weight Bearing Restrictions: No Pain: Pain Assessment Pain Scale: 0-10 Pain Score: 0-No pain Faces Pain Scale: No hurt Pain Type: Acute pain Pain Location: Arm Pain Orientation: Left Pain Descriptors / Indicators: Aching Pain Frequency: Intermittent Pain Onset: On-going Patients Stated Pain Goal: 2 Pain Intervention(s): Medication (See eMAR)   Therapy/Group: Individual Therapy  Azar South 10/23/2019, 11:06 AM

## 2019-10-23 NOTE — Patient Care Conference (Signed)
Inpatient RehabilitationTeam Conference and Plan of Care Update Date: 10/23/2019   Time: 11:44 AM    Patient Name: Elizabeth Tapia      Medical Record Number: [id number]  Date of Birth: 04-Apr-1929 Sex: Female         Room/Bed: 4M03C/4M03C-01 Payor Info: Payor: MEDICARE / Plan: MEDICARE PART A AND B / Product Type: *No Product type* /    Admit Date/Time:  10/07/2019  4:56 PM  Primary Diagnosis:  Cerebral infarction involving left carotid artery [facility])  Hospital Problems: Principal Problem:   Cerebral infarction involving left carotid artery (HCC) Active Problems:   Hypoalbuminemia due to protein-calorie malnutrition (HCC)   Hyponatremia   Acute blood loss anemia   Essential hypertension   Right hemiparesis (HCC)   Labile blood pressure   Protein-calorie malnutrition, severe   Dysphagia, post-stroke   Leukocytosis   AKI (acute kidney injury) (HCC)   History of hypertension   Acute lower UTI   Hematoma   Hypokalemia    Expected Discharge Date: Expected Discharge Date: 10/29/19  Team Members Present: Physician leading conference: Dr. Maryla Morrow Nurse Present: Other (comment) Lupita Dawn, RN) PT Present: Raechel Chute, PT OT Present: Rosalio Loud, OT SLP Present: Feliberto Gottron, SLP PPS Coordinator present : Fae Pippin, [place], PT     Current Status/Progress Goal Weekly Team Focus  Bowel/Bladder   continences with incontineces of urgency; LBM: 10/19  time toilet while awake  assist with toileting needs prn   Swallow/Nutrition/ Hydration   Dys 1 textures with thin via cup. downgrade to nectar thick liquids if coughing. Min A  Supervision A  tolerance of current diet   ADL's   max assist squat pivot transfers, mod-max assist sit > stand, mod assist bathing, max assist UB and LB dressing.  Pt with increased facial and LUE tremors and increased tone in RUE.  downgraded to Mod assist overall  ADL retraining, postural control, transfers, sit > stand, RUE NMR,  beginning family education/d/c planning   Mobility   bed mobility min/mod A, transfers max/total A, no gait  mod A (downgraded)  functional mobility/transfers, generalized strengthening, dynamic standing balance/coordination, NMR, improved activity tolerance.   Communication   90% intelligiblity at sentence level Mod A use of strategies  Min A - downgrade 10/19  education and use of strategies   Safety/Cognition/ Behavioral Observations  Mod-Min A  Min A - downgraded 10/19  education, basic/mildly complex, attention and safety awareness   Pain   no c/o pain  remain pain free  assess pain QS and prn   Skin   incision to R side of neck, skin glue OTA  remain free of new skin breakdown/infection  assess skin QS and prn     Discharge Planning:  Need to discuss with son if discharge home is a realistic option with the amount of care pt is requiring at this time. Son has been here and observed in therapies. If going home will begin hands on training with family   Team Discussion: MD noted almost finished with course of abx, IVF discontinued, monitoring lab values. Monitoring hematoma, no intervention planned at present; follow up outpatient. Staff note patient is drowsy, difficult to arouse, and requires cues to stay awake and participate in activities.Requires cues for sequencing with tasks,Max-total assist due to posterior lean and pushing to the right. Also noted cognitive deficits, memory issues, poor recall, poor carryover Patient on target to meet rehab goals: no, goals downgraded  *See Care Plan and progress notes  for long and short-term goals.   Revisions to Treatment Plan:  Little progress noted; downgraded goals to MOD assist Teaching Needs: Transfers, toileting, basic communication assistance needs,medications, etc.  Current Barriers to Discharge: Decreased caregiver support and Behavior  Possible Resolutions to Barriers:  HH follow up recommended Family education with son     Medical Summary Current Status: Right hemiparesis and functional deficits secondary to left fronto-parietal infarct d/t left carotid artery atheroembolism. Pt s/p left CEA on 10/04/19.  Barriers to Discharge: Medical stability;Decreased family/caregiver support;Nutrition means   Possible Resolutions to [employer] Focus: Therapies, trial Marinol, follow labs - Cr., K, Hb, abx for UTI,  advance diet as tolerated   Continued Need for Acute Rehabilitation Level of Care: The patient requires daily medical management by a physician with specialized training in physical medicine and rehabilitation for the following reasons: Direction of a multidisciplinary physical rehabilitation program to maximize functional independence : Yes Medical management of patient stability for increased activity during participation in an intensive rehabilitation regime.: Yes Analysis of laboratory values and/or radiology reports with any subsequent need for medication adjustment and/or medical intervention. : Yes   I attest that I was present, lead the team conference, and concur with the assessment and plan of the team.   Chana Bode B 10/23/2019, 1:09 PM

## 2019-10-23 NOTE — Progress Notes (Addendum)
[facility] PHYSICAL MEDICINE & REHABILITATION PROGRESS NOTE  Subjective/Complaints: Patient seen sitting up in bed this morning.  She states she slept well overnight.  She denies complaints.  She wants to know when she will be able to go home.  ROS: Denies CP, SOB, N/V/D  Objective: Vital Signs: Blood pressure (!) 147/59, pulse 77, temperature 98.3 F (36.8 C), resp. rate 16, height 5\' 2"  (1.575 m), weight 43.6 kg, SpO2 99 %. No results found. Recent Labs    10/22/19 0653 10/23/19 0610  WBC 9.0 7.8  HGB 9.4* 9.3*  HCT 30.4* 30.1*  PLT 578* 568*   Recent Labs    10/21/19 0700  NA 138  K 3.4*  CL 104  CO2 25  GLUCOSE 106*  BUN 16  CREATININE 0.98  CALCIUM 8.3*    Intake/Output Summary (Last 24 hours) at 10/23/2019 0905 Last data filed at 10/23/2019 0800 Gross per 24 hour  Intake 265 ml  Output --  Net 265 ml        Physical Exam: BP (!) 147/59 (BP Location: Left Arm)   Pulse 77   Temp 98.3 F (36.8 C)   Resp 16   Ht 5\' 2"  (1.575 m)   Wt 43.6 kg   SpO2 99%   BMI 17.58 kg/m  Constitutional: No distress . Vital signs reviewed. HENT: Normocephalic.  Atraumatic. Eyes: EOMI. No discharge. Cardiovascular: No JVD.  RRR. Respiratory: Normal effort.  No stridor.  Bilateral clear to auscultation. GI: Non-distended.  BS +. Skin: Warm and dry.  Neck incision CDI Psych: Normal mood.  Normal behavior. Musc: No edema in extremities.  No tenderness in extremities. Neuro: Alert Resting head tremor, unchanged Motor: RUE: Shoulder abduction 2+/5, elbow flexion/extension 2/5, handgrip 2/5 Right lower extremity: Hip flexion, knee extension 3-/5, ankle dorsiflexion 4+/5-unchanged  Assessment/Plan: 1. Functional deficits secondary to left frontoparietal infarct which require 3+ hours per day of interdisciplinary therapy in a comprehensive inpatient rehab setting.  Physiatrist is providing close team supervision and 24 hour management of active medical problems listed  below.  Physiatrist and rehab team continue to assess barriers to discharge/monitor patient progress toward functional and medical goals   Care Tool:  Bathing  Bathing activity did not occur: Refused Body parts bathed by patient: Right arm, Left arm, Chest, Abdomen, Face, Right upper leg, Left upper leg   Body parts bathed by helper: Right lower leg, Left lower leg     Bathing assist Assist Level: Maximal Assistance - Patient 24 - 49%     Upper Body Dressing/Undressing Upper body dressing   What is the patient wearing?: Button up shirt    Upper body assist Assist Level: Maximal Assistance - Patient 25 - 49%    Lower Body Dressing/Undressing Lower body dressing      What is the patient wearing?: Pants     Lower body assist Assist for lower body dressing: Maximal Assistance - Patient 25 - 49%     Toileting Toileting    Toileting assist Assist for toileting: Total Assistance - Patient < 25%     Transfers Chair/bed transfer  Transfers assist  Chair/bed transfer activity did not occur: Safety/medical concerns  Chair/bed transfer assist level: Maximal Assistance - Patient 25 - 49%     Locomotion Ambulation   Ambulation assist   Ambulation activity did not occur: Safety/medical concerns (R hemi, fatigue,generalized weakness, decresed balance/postural control)          Walk 10 feet activity   Assist  Walk 10  feet activity did not occur: Safety/medical concerns (R hemi, fatigue,generalized weakness, decresed balance/postural control)        Walk 50 feet activity   Assist Walk 50 feet with 2 turns activity did not occur: Safety/medical concerns (R hemi, fatigue,generalized weakness, decresed balance/postural control)         Walk 150 feet activity   Assist Walk 150 feet activity did not occur: Safety/medical concerns (R hemi, fatigue,generalized weakness, decresed balance/postural control)         Walk 10 feet on uneven surface   activity   Assist Walk 10 feet on uneven surfaces activity did not occur: Safety/medical concerns (R hemi, fatigue,generalized weakness, decresed balance/postural control)         Wheelchair     Assist Will patient use wheelchair at discharge?: Yes Type of Wheelchair: Manual    Wheelchair assist level: Moderate Assistance - Patient 50 - 74% Max wheelchair distance: 63ft    Wheelchair 50 feet with 2 turns activity    Assist        Assist Level: Total Assistance - Patient < 25%   Wheelchair 150 feet activity     Assist      Assist Level: Dependent - Patient 0%    Medical Problem List and Plan: 1.  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 Pt s/p left CEA on 10/04/19.  Continue CIR  Team conference today to discuss current and goals and coordination of care, home and environmental barriers, and discharge planning with nursing, case manager, and therapies. Please see conference note from today as well.  2.  Antithrombotics: -DVT/anticoagulation:  Pharmaceutical:  Heparin DC'd due to hemoglobin             -antiplatelet therapy: ASA/Plavix held initially, aspirin 81 restarted on 10/14 3. Pain Management:   As needed medications 4. Mood: LCSW to follow for evaluation and support.   Valium changed to qhs on 10/21             -antipsychotic agents: N/A 5. Neuropsych: This patient is capable of making decisions on her own behalf. 6. Skin/Wound Care: Monitor incision for healing. Routine pressure relief measures.  7. Fluids/Electrolytes/Nutrition: Monitor I/Os.   Trial Marinol-overall improving on 10/20 8. HTN:   Inderal 60 mg bid  Monitor with increased mobility   Vitals:   10/22/19 1948 10/23/19 0359  BP: 128/77 (!) 147/59  Pulse: 78 77  Resp: 16 16  Temp: 98.3 F (36.8 C) 98.3 F (36.8 C)  SpO2:  99%   Slightly labile on 10/20 9.  CKD IIIa/b: Encourage fluid intake.    Creatinine 0.98 on 10/18  Echo reviewed, EF 70-7 5%, IVF nightly started on 10/11, plan to DC later this week  Continue to monitor 10. ABLA:   Hemoglobin 9.3 on 10/20  Transfuse 2 units of PRBCs on 10/11  Continue to monitor 11. Anxiety/depression:  On valium bid to help manage anxiety state.   Fluoxetine DC'd due to Plavix 12. IBS--diarrhea: Questran on hold 13.  Hyponatremia  Sodium 138 on 10/18  Continue to monitor 14.  Severe hypoalbuminemia  Supplement initiated on 10/5 15. BMI 16.77- dietary consult for assess nutritional needs.  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  Acute lower UTI +/- PNA  WBC within normal limits on 10/13  Afebrile  UA equivocal, urine culture showing E. coli  Chest x-ray personally reviewed, showing left opacity,?  Atelectasis vs PNA  Cefepime changed to cefdinir 18.  Left rectus hematoma/?  Retroperitoneal hematoma/?  Aortic bleeding  Seen on abdominal CT and discussed with radiology  Appreciate vascular recs -no interventions recommended at this time, can continue to monitor  Stable 19.  Tremor  Patient states she has had tremor for years 20.  Hypokalemia  Potassium 3.4 on 10/18, labs ordered for tomorrow  LOS: 16 days A FACE TO FACE EVALUATION WAS PERFORMED  Jordanna Hendrie Karis Juba 10/23/2019, 9:05 AM

## 2019-10-23 NOTE — Progress Notes (Signed)
Physical Therapy Session Note  Patient Details  Name: Elizabeth Tapia MRN: [id number] Date of Birth: 12-26-1929  Today's Date: 10/23/2019 PT Individual Time: 1300-1405 PT Individual Time Calculation (min): 65 min  and Today's Date: 10/23/2019 PT Missed Time: 10 Minutes Missed Time Reason: Patient fatigue  Short Term Goals: Week 2:  PT Short Term Goal 1 (Week 2): Pt will perform bed mobility with supervision consistantly PT Short Term Goal 1 - Progress (Week 2): Progressing toward goal PT Short Term Goal 2 (Week 2): Pt wil transfer sit<>stand with LRAD mod A consistantly PT Short Term Goal 2 - Progress (Week 2): Progressing toward goal PT Short Term Goal 3 (Week 2): Pt will perform simulated car transfer with LRAD mod A PT Short Term Goal 3 - Progress (Week 2): Not progressing Week 3:  PT Short Term Goal 1 (Week 3): STG=LTG due to LOS  Skilled Therapeutic Interventions/Progress Updates:   Received pt sitting in WC, pt agreeable to therapy, and denied any pain during session but reported urge to use restroom and transferred sit<>stand in [place] max A and transferred dependently to toilet with bedside commode over top. Pt with increased R lateral and anterior lean today with pt resting head on [place] bar and leaning on R side wall in bathroom. Pt required max A to maintain midline orientation and attempted sit<>stand in [place] to doff pants/brief over commode. Pt unable to stand for therapist to doff pants/brief despite max cues and multiple attempts, with pt pushing harder posteriorly. Instead transferred dependently to bed via [facility] for peri-care for safety concerns due to pt's increased pushing posteriorly and to the R today. Pt reported feeling "bad" but was unable to elaborate on reason why. Required mod A to prevent pt from pushing herself to the R off the [place]. Sit<>supine with max A despite cues and encouragement. Doffed pants and brief dependently and pt incontinent of bladder. Performed  peri-care dependently and pt rolled L and R with min A and required total A to don clean brief and pants. Pt falling asleep as therapist assisting with dressing. Therapist notified RN and NT  of pt's current status and recommendation to use bedpan when toileting. Upon therapist's return pt stated "I'm not kidding I feel weak, I don't think I can sit up. With encouragement pt transferred supine<>sitting EOB with mod A with cues for logroll technique. In sitting, pt pushing to R and required cues to reach to L for footboard to achieve midline position. Pt unable to scoot hips forward to get feet on floor despite cues and then stated "I feel like I'm passing out". Sit<>supine with max A. BP in supine 129/59 HR 81bpm O2 sat 98%. Pt required total A and use of Trendelenburg bed position to scoot to [facility]. CSW and pt's son present at end of session to discuss family education training tomorrow and options for discharge (SNF, lift, or total A transfers). Concluded session with pt supine in bed, needs within reach, and bed alarm on. Pt's son present at bedside. 10 minutes missed of skilled physical therapy due to fatigue.   Therapy Documentation Precautions:  Precautions Precautions: Fall Precaution Comments: R hemiparesis Restrictions Weight Bearing Restrictions: No  Therapy/Group: Individual Therapy Martin Majestic PT, DPT   10/23/2019, 7:32 AM

## 2019-10-23 NOTE — Progress Notes (Addendum)
Patient ID: Elizabeth Tapia, female   DOB: 12/16/1929, 86 y.o.   MRN: [id number]  Met with pt and son who was here in the room and spoke with Fran-daughter in-law via telephone to discuss team conference progress and amount of care pt requires. The concern is whether family can provide the amount of care pt needs. She tends to have good days and bad days. Son and daughter in-law also concerned about this. Will have them come in tomorrow for therapy session so can begin hands on training to see if can provide the care she needs at home. Pt was included in this conversation and is aware of the concerns. Son and daughter in-law will be here tomorrow for 9:15 PT session-Carly filling in for Vicente Males will be aware of the plan for tomorrow. Continue to work on safest plan for pt and family  3:36 pm PT session is actually at 9:00 have called Fran-daughter in-law to inform session is at 9:00 instead of 9:15. See them tomorrow.

## 2019-10-24 ENCOUNTER — Inpatient Hospital Stay (HOSPITAL_COMMUNITY): Payer: Medicare Other | Admitting: Occupational Therapy

## 2019-10-24 ENCOUNTER — Inpatient Hospital Stay (HOSPITAL_COMMUNITY): Payer: Medicare Other

## 2019-10-24 ENCOUNTER — Inpatient Hospital Stay (HOSPITAL_COMMUNITY): Payer: Medicare Other | Admitting: Physical Therapy

## 2019-10-24 ENCOUNTER — Inpatient Hospital Stay (HOSPITAL_COMMUNITY): Payer: Medicare Other | Admitting: Speech Pathology

## 2019-10-24 LAB — CBC WITH DIFFERENTIAL/PLATELET
Abs Immature Granulocytes: 0.02 10*3/uL (ref 0.00–0.07)
Basophils Absolute: 0.1 10*3/uL (ref 0.0–0.1)
Basophils Relative: 1 %
Eosinophils Absolute: 0.4 10*3/uL (ref 0.0–0.5)
Eosinophils Relative: 5 %
HCT: 31.1 % — ABNORMAL LOW (ref 36.0–46.0)
Hemoglobin: 9.7 g/dL — ABNORMAL LOW (ref 12.0–15.0)
Immature Granulocytes: 0 %
Lymphocytes Relative: 22 %
Lymphs Abs: 1.5 10*3/uL (ref 0.7–4.0)
MCH: 29.6 pg (ref 26.0–34.0)
MCHC: 31.2 g/dL (ref 30.0–36.0)
MCV: 94.8 fL (ref 80.0–100.0)
Monocytes Absolute: 1 10*3/uL (ref 0.1–1.0)
Monocytes Relative: 14 %
Neutro Abs: 4 10*3/uL (ref 1.7–7.7)
Neutrophils Relative %: 58 %
Platelets: 569 10*3/uL — ABNORMAL HIGH (ref 150–400)
RBC: 3.28 MIL/uL — ABNORMAL LOW (ref 3.87–5.11)
RDW: 13.8 % (ref 11.5–15.5)
WBC: 7 10*3/uL (ref 4.0–10.5)
nRBC: 0 % (ref 0.0–0.2)

## 2019-10-24 LAB — BASIC METABOLIC PANEL
Anion gap: 6 (ref 5–15)
BUN: 14 mg/dL (ref 8–23)
CO2: 27 mmol/L (ref 22–32)
Calcium: 8.5 mg/dL — ABNORMAL LOW (ref 8.9–10.3)
Chloride: 106 mmol/L (ref 98–111)
Creatinine, Ser: 1 mg/dL (ref 0.44–1.00)
GFR, Estimated: 50 mL/min — ABNORMAL LOW (ref >60)
Glucose, Bld: 103 mg/dL — ABNORMAL HIGH (ref 70–99)
Potassium: 3.7 mmol/L (ref 3.5–5.1)
Sodium: 139 mmol/L (ref 135–145)

## 2019-10-24 MED ORDER — ENSURE ENLIVE PO LIQD
237.0000 mL | Freq: Three times a day (TID) | ORAL | Status: DC
  Administered 2019-10-24 – 2019-10-26 (×3): 237 mL via ORAL

## 2019-10-24 NOTE — Progress Notes (Signed)
Patient ID: Elizabeth Tapia, female   DOB: August 14, 1929, 86 y.o.   MRN: [id number]  Met with pt, son and daughter in-law who are here for PT session. All have decided to try to take pt home where she would be most comfortable and happiest. Have scheduled more education for Sat at 11:00 and Monday @ 10:00-12:00 and 1:00-2:00 pm. Fran-daughter in-law to let me know if coming tomorrow. Have asked for wheelchair eval-via [place]. Awaiting Corene Cornea to return call regarding this. Also discussed hospital bed and transportation system for MD appointments. Will continue to work on discharge home and what pt will need.

## 2019-10-24 NOTE — Progress Notes (Signed)
Occupational Therapy Session Note  Patient Details  Name: Elizabeth Tapia MRN: [id number] Date of Birth: 1929/01/22  Today's Date: 10/24/2019 OT Individual Time: 8177-1165 OT Individual Time Calculation (min): 56 min    Short Term Goals: Week 1:  OT Short Term Goal 1 (Week 1): Pt will complete LB dressing with max assist at sit > stand level OT Short Term Goal 1 - Progress (Week 1): Progressing toward goal OT Short Term Goal 2 (Week 1): Pt will complete toilet transfers max assist OT Short Term Goal 2 - Progress (Week 1): Progressing toward goal OT Short Term Goal 3 (Week 1): Pt will complete bathing with mod assist at sit > stand level OT Short Term Goal 4 (Week 1): Pt will utilize RUE as gross assist during self-care tasks  Skilled Therapeutic Interventions/Progress Updates:    1:1. Pt recievd in bed with no pain. Pt requires increased time and increased environmental stim to achieve and maintain arousal (sternal rub, loud talking and lights on). Pt agreeable to dressing at bed level with brief change/peri hygiene. Pt able to roll to R with tactile cues for sequence and MIN A. Pt able to bridge hips with A to stabilize RLE and OT advances pants past hips. Pt completes squat pivot transfer throughout session with MAX-TOTAL A EOB<>w/c<>DAC with R knee block and A to manage pants for toileting. Pt completes UB dressing at EOB with S-mod A for sitting balance d/t pushing tendencies with imprved midline orientation after spending 2 min on L elbow. Grooming at sink completed with R visual scanning to locate all items and A to use RUE as stabilizer. Exited sessionw iht pt seated in w/c, belt alarm on and call light itn reach  Therapy Documentation Precautions:  Precautions Precautions: Fall Precaution Comments: R hemiparesis Restrictions Weight Bearing Restrictions: No General:   Vital Signs: Therapy Vitals Temp: 98.4 F (36.9 C) Pulse Rate: 77 Resp: 16 BP: 121/61 Patient Position (if  appropriate): Lying Oxygen Therapy SpO2: 97 % O2 Device: Room Air Pain:   ADL: ADL Toilet Transfer: Dependent Toilet Transfer Method: Squat pivot ADL Comments: Pt dressed during PT eval and declined bathing or dressing during eval/treatment session Vision   Perception    Praxis   Exercises:   Other Treatments:     Therapy/Group: Individual Therapy  Shon Hale 10/24/2019, 7:02 AM

## 2019-10-24 NOTE — Progress Notes (Signed)
Nutrition Follow-up  DOCUMENTATION CODES:   Underweight, Severe malnutrition in context of chronic illness  INTERVENTION:   - Ensure Enlive po TID, each supplement provides 350 kcal and 20 grams of protein  - Vital Cuisine Shake TID with meals, each supplement provides 520 kcal and 22 grams of protein  - Magic cup TID with meals, each supplement provides 290 kcal and 9 grams of protein  - ProSource Plus 30 ml po BID, each supplement provides 100 kcal and 15 grams of protein  NUTRITION DIAGNOSIS:   Severe Malnutrition related to chronic illness (CKD) as evidenced by severe muscle depletion, severe fat depletion.  Ongoing  GOAL:   Patient will meet greater than or equal to 90% of their needs  Progressing  MONITOR:   PO intake, Supplement acceptance, Diet advancement, Weight trends, Labs, Skin  REASON FOR ASSESSMENT:   Consult Assessment of nutrition requirement/status, Calorie Count  ASSESSMENT:   86 year old female with PMH of HTN, CKD, anxiety, depression, tremors. 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 On 10/01, pt underwent left carotid endarterectomy. Pt admitted to CIR on 10/04.  10/13 - diet downgraded to dysphagia 1 with nectar-thick liquids 10/19 - upgraded to thin liquids  Attempted to speak with pt at bedside. Pt sleeping soundly and did not awaken to RD voice. MD doing marinol trial which has somewhat improved PO intake. Pt back on thin liquids; will reorder Ensure Enlive as pt had enjoyed these supplements in the past.  Meal Completion: 25-75%  Medications reviewed and include: ProSource Plus 30 ml BID, marinol, prozac, protonix IVF: NS @ 75 ml/hr for 12 hours overnight  Labs reviewed: hemoglobin 9.7   Diet Order:   Diet Order            DIET - DYS 1 Room service appropriate? Yes; Fluid consistency: Thin  Diet effective now                  EDUCATION NEEDS:   Education needs have been addressed  Skin:  Skin Assessment: Skin Integrity Issues: Incisions: neck Other: open blister to upper back  Last BM:  10/24/19 medium type 6  Height:   Ht Readings from Last 1 Encounters:  10/07/19 5\' 2"  (1.575 m)    Weight:   Wt Readings from Last 1 Encounters:  10/15/19 43.6 kg    Ideal Body Weight:  50 kg  BMI:  Body mass index is 17.58 kg/m.  Estimated Nutritional Needs:   Kcal:  1350-1550  Protein:  65-80 grams  Fluid:  1.4-1.6 L    12/15/19, MS, RD, LDN Inpatient Clinical Dietitian Please see [facility] for contact information.

## 2019-10-24 NOTE — Progress Notes (Signed)
Physical Therapy Session Note  Patient Details  Name: Elizabeth Tapia MRN: [id number] Date of Birth: 1929-06-26  Today's Date: 10/24/2019 PT Individual Time: 8657-8469 PT Individual Time Calculation (min): 75 min   Short Term Goals: Week 3:  PT Short Term Goal 1 (Week 3): STG=LTG due to LOS  Skilled Therapeutic Interventions/Progress Updates:    Pt received sitting in w/c with her family present for family education/training and pt agreeable to therapy session. 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 Therapist educated pt's family on w/c parts/management and use of gait belt for transfers. Provided visual demonstration and verbal explanation of proper w/c set-up for squat pivot transfers, proper sequencing of transfers, and how to properly assist pt. Pt performed 2x squat pivot transfers w/c<>EOB with max/total assist from therapist for lifting/pivoting hips while educating pt's family on form/technique and pt safety. Pt's son then assisted pt with same transfer 2x with therapist initially providing CGA for safety progressed to supervision - pt's daughter-in-law also assist pt with the same transfer 2x with therapist having to provide min/mod assist for pivoting pt's hips (pt becoming more fatigued and DIL having increased difficulty performing transfer without assist of 2nd person) - educated family that the DIL will require the assistance of 2nd person to assist pt with transfers. Pt starting to fall asleep reporting significant fatigue. Sit>supine via reverse logroll technique with max assist while educating pt/family on how to properly assist her with this task. Pt's family confirms their plan is to provide pt 24/7 support and they report after initiating hands-on training today they feel confident they can safely assist pt at home - therapist emphasized the importance of having them come in every  day, as possible, until D/C to continue with hands-on training - pt's family verbalized understanding. Pt's family reports primary concerns now are car and toilet transfers - discussed starting with the car simulator and then if safe performing real car transfer with family's vehicle - OT to address toilet transfers. Therapist also made family aware that pt may require transport services if unable to safely perform car transfer. Discussed home entry and need for family to measure critical heights/widths in the home to ensure it is w/c accessible (home measurement sheet provided). Pt's family reports they have 8 STE home - therapist educated pt/family on proper technique/sequencing of bumping pt up steps in w/c (pt did not sit in the w/c for this as she was too fatigued, therapist placed weights in w/c to help simulate task) - pt/family demonstrated understanding and therapist provided educational printout on how to perform this as a reference. Elizabeth Tapia, SW present at end of session to discuss discharge plan and therapist educated pt/family on recommendation for follow-up HHPT, w/c consultation, and likely need of hospital bed. Pt/family in agreement - pt/family confirm they plan to attend additional training. Pt left supine in bed with needs in reach, bed alarm on, and pt falling asleep.  Therapy Documentation Precautions:  Precautions Precautions: Fall Precaution Comments: R hemiparesis Restrictions Weight Bearing Restrictions: No  Pain:   No reports of pain throughout session.   Therapy/Group: Individual Therapy  Ginny Forth , PT, DPT, CSRS  10/24/2019, 7:57 AM

## 2019-10-24 NOTE — Progress Notes (Signed)
[facility] PHYSICAL MEDICINE & REHABILITATION PROGRESS NOTE  Subjective/Complaints: Patient seen sitting up in her chair this morning.  She states she slept well overnight.  She states she had therapies early this morning and got ready.  ROS: Denies CP, SOB, N/V/D  Objective: Vital Signs: Blood pressure 121/61, pulse 77, temperature 98.4 F (36.9 C), resp. rate 16, height 5\' 2"  (1.575 m), weight 43.6 kg, SpO2 97 %. No results found. Recent Labs    10/23/19 0610 10/24/19 0555  WBC 7.8 7.0  HGB 9.3* 9.7*  HCT 30.1* 31.1*  PLT 568* 569*   Recent Labs    10/24/19 0555  NA 139  K 3.7  CL 106  CO2 27  GLUCOSE 103*  BUN 14  CREATININE 1.00  CALCIUM 8.5*    Intake/Output Summary (Last 24 hours) at 10/24/2019 1242 Last data filed at 10/24/2019 0830 Gross per 24 hour  Intake 220 ml  Output --  Net 220 ml        Physical Exam: BP 121/61 (BP Location: Left Arm)   Pulse 77   Temp 98.4 F (36.9 C)   Resp 16   Ht 5\' 2"  (1.575 m)   Wt 43.6 kg   SpO2 97%   BMI 17.58 kg/m  Constitutional: No distress . Vital signs reviewed. HENT: Normocephalic.  Atraumatic. Eyes: EOMI. No discharge. Cardiovascular: No JVD.  RRR. Respiratory: Normal effort.  No stridor.  Bilateral clear to auscultation. GI: Non-distended.  BS +. Skin: Warm and dry.  Neck incision CDI Psych: Normal mood.  Normal behavior. Musc: No edema in extremities.  No tenderness in extremities. Neuro: Alert Resting head tremor, stable Motor: RUE: Shoulder abduction 2+/5, elbow flexion/extension 2/5, handgrip 2 -/5 Right lower extremity: Hip flexion, knee extension 3-/5, ankle dorsiflexion 4+/5-unchanged  Assessment/Plan: 1. Functional deficits secondary to left frontoparietal infarct which require 3+ hours per day of interdisciplinary therapy in a comprehensive inpatient rehab setting.  Physiatrist is providing close team supervision and 24 hour management of active medical problems listed  below.  Physiatrist and rehab team continue to assess barriers to discharge/monitor patient progress toward functional and medical goals   Care Tool:  Bathing  Bathing activity did not occur: Refused Body parts bathed by patient: Right arm, Left arm, Chest, Abdomen, Face, Right upper leg, Left upper leg   Body parts bathed by helper: Right lower leg, Left lower leg     Bathing assist Assist Level: Maximal Assistance - Patient 24 - 49%     Upper Body Dressing/Undressing Upper body dressing   What is the patient wearing?: Button up shirt    Upper body assist Assist Level: Moderate Assistance - Patient 50 - 74%    Lower Body Dressing/Undressing Lower body dressing      What is the patient wearing?: Pants     Lower body assist Assist for lower body dressing: Maximal Assistance - Patient 25 - 49%     Toileting Toileting    Toileting assist Assist for toileting: Total Assistance - Patient < 25%     Transfers Chair/bed transfer  Transfers assist  Chair/bed transfer activity did not occur: Safety/medical concerns  Chair/bed transfer assist level: Maximal Assistance - Patient 25 - 49%     Locomotion Ambulation   Ambulation assist   Ambulation activity did not occur: Safety/medical concerns (R hemi, fatigue,generalized weakness, decresed balance/postural control)          Walk 10 feet activity   Assist  Walk 10 feet activity did not occur:  Safety/medical concerns (R hemi, fatigue,generalized weakness, decresed balance/postural control)        Walk 50 feet activity   Assist Walk 50 feet with 2 turns activity did not occur: Safety/medical concerns (R hemi, fatigue,generalized weakness, decresed balance/postural control)         Walk 150 feet activity   Assist Walk 150 feet activity did not occur: Safety/medical concerns (R hemi, fatigue,generalized weakness, decresed balance/postural control)         Walk 10 feet on uneven surface   activity   Assist Walk 10 feet on uneven surfaces activity did not occur: Safety/medical concerns (R hemi, fatigue,generalized weakness, decresed balance/postural control)         Wheelchair     Assist Will patient use wheelchair at discharge?: Yes Type of Wheelchair: Manual    Wheelchair assist level: Moderate Assistance - Patient 50 - 74% Max wheelchair distance: 75ft    Wheelchair 50 feet with 2 turns activity    Assist        Assist Level: Total Assistance - Patient < 25%   Wheelchair 150 feet activity     Assist      Assist Level: Dependent - Patient 0%    Medical Problem List and Plan: 1.  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 Pt s/p left CEA on 10/04/19.  Continue CIR 2.  Antithrombotics: -DVT/anticoagulation:  Pharmaceutical:  Heparin DC'd due to hemoglobin             -antiplatelet therapy: ASA/Plavix held initially, aspirin 81 restarted on 10/14 3. Pain Management:   As needed medications 4. Mood: LCSW to follow for evaluation and support.   Valium changed to qhs on 10/21, monitor for improvement in lethargy             -antipsychotic agents: N/A 5. Neuropsych: This patient is capable of making decisions on her own behalf. 6. Skin/Wound Care: Monitor incision for healing. Routine pressure relief measures.  7. Fluids/Electrolytes/Nutrition: Monitor I/Os.   Trial Marinol-fair, but somewhat improved intake 8. HTN:   Inderal 60 mg bid  Monitor with increased mobility   Vitals:   10/23/19 1911 10/24/19 0324  BP: 135/66 121/61  Pulse: 80 77  Resp: 15 16  Temp: 97.9 F (36.6 C) 98.4 F (36.9 C)  SpO2: 97% 97%   Controlled on 10/21 9.  CKD IIIa/b: Encourage fluid intake.   Creatinine 1.00 on 10/21  Echo reviewed, EF 70-7 5%, IVF nightly started on 10/11, plan to DC tomorrow  Continue to monitor 10. ABLA:   Hemoglobin 9.7 on 10/21  Transfuse 2 units  of PRBCs on 10/11  Continue to monitor 11. Anxiety/depression:  On valium bid to help manage anxiety state.   Fluoxetine DC'd due to Plavix 12. IBS--diarrhea: Questran on hold 13.  Hyponatremia: Resolved  Sodium 139 on 10/21  Continue to monitor 14.  Severe hypoalbuminemia  Supplement initiated on 10/5 15. BMI 16.77- dietary consult for assess nutritional needs.  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  Acute lower UTI +/- PNA  WBC within normal limits on 10/13  Afebrile  UA equivocal, urine culture showing E. coli  Chest x-ray personally reviewed, showing left opacity,?  Atelectasis vs PNA  Cefepime changed to cefdinir, completed course on 10/21 18.  Left rectus hematoma/?  Retroperitoneal hematoma/?  Aortic bleeding  Seen on abdominal CT and discussed with radiology  Appreciate vascular recs -no  interventions recommended at this time, can continue to monitor  Clinically improving 19.  Tremor  Patient states she has had tremor for years 20.  Hypokalemia  Potassium 3.7 on 10/21, continue to monitor  LOS: 17 days A FACE TO FACE EVALUATION WAS PERFORMED  Anay Walter Karis Juba 10/24/2019, 12:42 PM

## 2019-10-24 NOTE — Progress Notes (Signed)
Occupational Therapy Session Note  Patient Details  Name: Elizabeth Tapia MRN: [id number] Date of Birth: 1929-10-24  Today's Date: 10/24/2019 OT Individual Time: 7371-0626 OT Individual Time Calculation (min): 42 min    Short Term Goals: Week 3:  OT Short Term Goal 1 (Week 3): Pt will complete toilet transfers max assist OT Short Term Goal 2 (Week 3): Pt will don pants with mod assist at sit > stand level OT Short Term Goal 3 (Week 3): Pt will utilize RUE as gross assist during self-care tasks OT Short Term Goal 4 (Week 3): Pt will don shirt with mod assist   Skilled Therapeutic Interventions/Progress Updates:    Pt greeted at time of session semi-reclined in bed with family present for family ed, two sons and daughter in law. No c/o pain throughout. Pt's family members had questions regarding ADLs, toileting, positioning, and pressure relief, which were answered. Extensive discussion regarding positioning and offloading pressure, encouraging time spent up in chair vs in bed. Family training for donning pants with first doffing with pt in supine, rolling L and R with Max A, family instructed on rolling technique for knee flexion and having pt assist with grabbing rails to donn/doff clothing. Donned pants back on with Max A, pt rolling L and R, daughter in law assisting. Pt supine to sit with Mod/Max A with assist for trunk elevation. Discussion regarding bed pan vs drop arm BSC, family wanting to primarily use BSC. Therapist demonstrated squat pivot transfer bed <> drop arm BSC, total A/dependent. Son performed as well, cues to use legs instead of his back to lift but otherwise good carryover. Daughter in law declined squat pivot at this time. Also demonstrated brief change with rolling L/R and techniques to clean for thoroughness. Pt's family receptive but does need continued demonstration and hands on training. Pt in bed supine, alarm on call bell in reach.   Therapy Documentation Precautions:   Precautions Precautions: Fall Precaution Comments: R hemiparesis Restrictions Weight Bearing Restrictions: No     Therapy/Group: Individual Therapy  Erasmo Score 10/24/2019, 5:23 PM

## 2019-10-24 NOTE — Progress Notes (Signed)
Speech Language Pathology Daily Session Note  Patient Details  Name: Elizabeth Tapia MRN: [id number] Date of Birth: Jan 16, 1929  Today's Date: 10/24/2019 SLP Individual Time: 1500-1530 SLP Individual Time Calculation (min): 30 min  Short Term Goals: Week 3: SLP Short Term Goal 1 (Week 3): STG=LTG due to short ELOS  Skilled Therapeutic Interventions:   Patient seen for skilled ST session focusing on dysphagia goals. Patient has been recently placed back on thin liquids from nectar thick liquids. Patient consumed 3-4 ounces of thin liquids via cup sips with SLP assisting with holding cup due to patient with tremors. She exhibited 2 instances of mild cough out of 10-12 sips. Patient's voice remained clear throughout. Patient continues to benefit from skilled SLP intervention to maximize speech-language, cognitive and swallow function prior to discharge.  Pain Pain Assessment Pain Scale: 0-10 Pain Score: 0-No pain  Therapy/Group: Individual Therapy   Angela Nevin, MA, CCC-SLP Speech Therapy

## 2019-10-25 ENCOUNTER — Inpatient Hospital Stay (HOSPITAL_COMMUNITY): Payer: Medicare Other | Admitting: Occupational Therapy

## 2019-10-25 ENCOUNTER — Inpatient Hospital Stay (HOSPITAL_COMMUNITY): Payer: Medicare Other | Admitting: Speech Pathology

## 2019-10-25 ENCOUNTER — Inpatient Hospital Stay (HOSPITAL_COMMUNITY): Payer: Medicare Other | Admitting: Physical Therapy

## 2019-10-25 NOTE — Progress Notes (Signed)
Speech Language Pathology Daily Session Note  Patient Details  Name: Elizabeth Tapia MRN: [id number] Date of Birth: [birth year]  Today's Date: 10/25/2019 SLP Individual Time: 1210-1240 SLP Individual Time Calculation (min): 30 min  Short Term Goals: Week 3: SLP Short Term Goal 1 (Week 3): STG=LTG due to short ELOS  Skilled Therapeutic Interventions:   Patient seen by skilled ST session focusing on swallow function with SLP observing patient consuming lunch meal (Dys 1, thin liquids). She was able to self-feed after SLP setup assistance and consumed approximately 30% of solids. She exhibited immediate coughing on 3 of 12 sips leading to throat clearing but overall appears to be tolerating this diet well. Patient continues to endorse h/o some coughing with liquids. Patient continues to benefit from skilled SLP to maximize cognitive-linguistic and swallow function prior to discharge.  Pain Pain Assessment Pain Scale: 0-10 Pain Score: 0-No pain  Therapy/Group: Individual Therapy  Angela Nevin, MA, CCC-SLP Speech Therapy

## 2019-10-25 NOTE — Progress Notes (Signed)
Physical Therapy Session Note  Patient Details  Name: Elizabeth Tapia MRN: [id number] Date of Birth: 01-05-1929  Today's Date: 10/25/2019 PT Individual Time: 1125-1205 PT Individual Time Calculation (min): 40 min   Short Term Goals: Week 3:  PT Short Term Goal 1 (Week 3): STG=LTG due to LOS  Skilled Therapeutic Interventions/Progress Updates:    Pt received sitting in w/c with her son, Ree Kida, present and pt agreeable to therapy session. Brandon, ATP from [employer] present for w/c consultation and evaluation. Therapist, ATP, patient, and family discussed benefits of TIS wheelchair versus lightweight K4 wheelchair with goal of increasing pt's functional independence at wheelchair level. Pt/family in agreement with K4 w/c recommendation.  Discussed pt's need for the following wheelchair specifics:  - a w/c back that accommodates her spinal alignments of excessive thoracic kyphosis and R lateral trunk flexion towards hemiparetic side  - flip back arm rests to allow safe squat pivot transfers  - R half lap tray to support paretic R UE - pressure relieving cushion due to pt's inability to perform independent pressure relief  L squat pivot transfer w/c>EOB with max assist for lifting/pivoting hips and cuing for head/hips relationship for increased pt participation - pt holding L arm around therapist which decreased pusher tendencies. Pt able to maintain static sitting balance EOB with close supervision. L squat pivot to loaner w/c with max assist - same cuing as above. Therapist and ATP modified Boa straps on Acta Relief back to improve spinal alignment. Discussed with pt/family 2 pressure relief techniques via having pt's family tilt her back in w/c versus assisting pt with forward trunk flexion - family would benefit from additional education on this. Educated pt in L hemi-technique w/c propulsion primarily using L LE with mod assist progressed to min assist for ~85ft - pt would benefit from a tennis  shoe to improve grip on floor - pt demoed improved directional control of w/c when only using LE compared to trying to perform both UE and LE hemi-technique. At end of session, pt agreeable to remain seated in w/c for SLP session - left with needs in reach, safety belt on, and family present.  Therapy Documentation Precautions:  Precautions Precautions: Fall Precaution Comments: R hemiparesis Restrictions Weight Bearing Restrictions: No  Pain: No reports of pain throughout session.  Therapy/Group: Individual Therapy  Ginny Forth , PT, DPT, CSRS  10/25/2019, 8:05 AM

## 2019-10-25 NOTE — Plan of Care (Signed)
  Problem: RH Balance Goal: LTG Patient will maintain dynamic standing with ADLs (OT) Description: LTG:  Patient will maintain dynamic standing balance with assist during activities of daily living (OT)  Flowsheets (Taken 10/25/2019 1535) LTG: Pt will maintain dynamic standing balance during ADLs with: (downgraded) Moderate Assistance - Patient 50 - 74% Note: Downgraded due to slow progress and decreased postural control, decreased balance, and poor endurance   Problem: RH Dressing Goal: LTG Patient will perform upper body dressing (OT) Description: LTG Patient will perform upper body dressing with assist, with/without cues (OT). Flowsheets (Taken 10/25/2019 1535) LTG: Pt will perform upper body dressing with assistance level of: (downgraded) Minimal Assistance - Patient > 75% Goal: LTG Patient will perform lower body dressing w/assist (OT) Description: LTG: Patient will perform lower body dressing with assist, with/without cues in positioning using equipment (OT) Flowsheets (Taken 10/25/2019 1535) LTG: Pt will perform lower body dressing with assistance level of: (downgraded) Maximal Assistance - Patient 25 - 49% Note: Downgraded due to slow progress and decreased postural control, decreased balance, and poor endurance   Problem: RH Toileting Goal: LTG Patient will perform toileting task (3/3 steps) with assistance level (OT) Description: LTG: Patient will perform toileting task (3/3 steps) with assistance level (OT)  Flowsheets (Taken 10/25/2019 1535) LTG: Pt will perform toileting task (3/3 steps) with assistance level: (downgraded) Maximal Assistance - Patient 25 - 49% Note: Downgraded due to slow progress and decreased postural control, decreased balance, and poor endurance   Problem: RH Functional Use of Upper Extremity Goal: LTG Patient will use RT/LT upper extremity as a (OT) Description: LTG: Patient will use right/left upper extremity as a stabilizer/gross  assist/diminished/nondominant/dominant level with assist, with/without cues during functional activity (OT) Flowsheets (Taken 10/25/2019 1535) LTG: Use of upper extremity in functional activities: RUE as a stabilizer   Problem: RH Toilet Transfers Goal: LTG Patient will perform toilet transfers w/assist (OT) Description: LTG: Patient will perform toilet transfers with assist, with/without cues using equipment (OT) Flowsheets (Taken 10/25/2019 1535) LTG: Pt will perform toilet transfers with assistance level of: (downgraded) Maximal Assistance - Patient 25 - 49% Note: Downgraded due to slow progress and decreased postural control, decreased balance, and poor endurance

## 2019-10-25 NOTE — Progress Notes (Signed)
Occupational Therapy Session Note  Patient Details  Name: Elizabeth Tapia MRN: [id number] Date of Birth: [birth year]  Today's Date: 10/25/2019 OT Individual Time: 3329-5188 and 4166-0630 OT Individual Time Calculation (min): 62 min and 45 min   Short Term Goals: Week 3:  OT Short Term Goal 1 (Week 3): Pt will complete toilet transfers max assist OT Short Term Goal 2 (Week 3): Pt will don pants with mod assist at sit > stand level OT Short Term Goal 3 (Week 3): Pt will utilize RUE as gross assist during self-care tasks OT Short Term Goal 4 (Week 3): Pt will don shirt with mod assist  Skilled Therapeutic Interventions/Progress Updates:    1) Treatment session with focus on bed mobility, sitting balance, trunk control, transfers, and positioning of RUE during self-care and transfers.  Pt received supine in bed asleep, requiring increased time and tactile cues to fully arouse.  Pt report fatigue but agreeable to therapy session. Engaged in LB bathing and dressing at bed level due to fatigue and pt found to have been incontinent of BM.  Pt able to complete rolling with min assist and use of bed rails.  Total assist hygiene due to incontinence.  Pt assisted in LB dressing with bridging with setup for BLE positioning and pt able to pull pants up over Lt hip.  Engaged in rolling to complete sidelying to sitting with mod assist.  Min assist sitting balance at EOB.  Completed lateral scoot along EOB in preparation for transfer with max assist.  Completed squat pivot transfer total assist to Lt with facilitation for anterior weight shift and UE positioning to decrease pushing.  Pt engaged in self-feeding sitting upright in w/c with setup for items.  Pt with increased bouts of coughing when drinking thin liquid with meal - notified SLP.  Engaged in hand washing at sink with setup for positioning of RUE.  Due to no clean shirts, donned hospital gown with min assist.  Pt remained upright in w/c with SLP for next  session.  2) Treatment session with focus on functional transfers, sit <> stand, postural control, and RUE NMR.  Pt received upright in w/c agreeable to therapy session.  Engaged in squat pivot transfers w/c <> therapy mat with focus on anterior weight shift and postural control during transfers to decrease burden of care.  Pt able to complete squat pivot transfers max assist with blocked practice, still requires max cues for technique and body positioning.  Engaged in blocked practice of sit > stand with use of mirror for visual feedback and cues for "nose of toes" to facilitate anterior weight shift.  Pt continues to require mod-max assist for sit > stand and mod assist to maintain upright standing.  Utilized table placed in front to provide UE support to further increase upright posture.  Engaged in reaching across midline to pick up pegs and then outside BOS to place in container facilitate weight shift as needed for dynamic standing and postural control as needed for LB dressing in standing and to progress to ambulation.  Engaged in PROM and AAROM with focus on elbow extension, due to increased tone.  Utilized UE Ranger with focus on increased activation and ROM with elbow flexion/extension and horizontal abduction/adduction.  Pt returned to room and transferred back to bed with max assist and left semi-reclined with all needs in reach.  Therapy Documentation Precautions:  Precautions Precautions: Fall Precaution Comments: R hemiparesis Restrictions Weight Bearing Restrictions: No Pain: Pain Assessment Pain Scale: 0-10 Pain  Score: 0-No pain   Therapy/Group: Individual Therapy  Rosalio Loud 10/25/2019, 8:49 AM

## 2019-10-25 NOTE — Progress Notes (Signed)
[place] PHYSICAL MEDICINE & REHABILITATION PROGRESS NOTE  Subjective/Complaints: Patient seen sitting up in a chair this morning.  She states she slept well overnight.  Family at bedside.  She is working with therapies.  She states she is doing well, although she does not look well.  She states she only wore her orthoses for part of the night due to discomfort.  ROS: Denies CP, SOB, N/V/D  Objective: Vital Signs: Blood pressure (!) 142/68, pulse 72, temperature 98.1 F (36.7 C), temperature source Oral, resp. rate 19, height 5\' 2"  (1.575 m), weight 43.6 kg, SpO2 96 %. No results found. Recent Labs    10/23/19 0610 10/24/19 0555  WBC 7.8 7.0  HGB 9.3* 9.7*  HCT 30.1* 31.1*  PLT 568* 569*   Recent Labs    10/24/19 0555  NA 139  K 3.7  CL 106  CO2 27  GLUCOSE 103*  BUN 14  CREATININE 1.00  CALCIUM 8.5*    Intake/Output Summary (Last 24 hours) at 10/25/2019 1312 Last data filed at 10/25/2019 0900 Gross per 24 hour  Intake 240 ml  Output --  Net 240 ml        Physical Exam: BP (!) 142/68 (BP Location: Left Arm)   Pulse 72   Temp 98.1 F (36.7 C) (Oral)   Resp 19   Ht 5\' 2"  (1.575 m)   Wt 43.6 kg   SpO2 96%   BMI 17.58 kg/m  Constitutional: No distress . Vital signs reviewed. HENT: Normocephalic.  Atraumatic. Eyes: EOMI. No discharge. Cardiovascular: No JVD.  RRR. Respiratory: Normal effort.  No stridor.  Bilateral clear to auscultation. GI: Non-distended.  BS +. Skin: Warm and dry.  Neck incision CDI Psych: Normal mood.  Normal behavior. Musc: No edema in extremities.  No tenderness in extremities. Neuro: Alert Resting head tremor, unchanged Motor: RUE: Shoulder abduction 2+/5, elbow flexion/extension 2 -/5, handgrip 1+/5 Right lower extremity: Hip flexion, knee extension 3-/5, ankle dorsiflexion 4+/5  Assessment/Plan: 1. Functional deficits secondary to left frontoparietal infarct which require 3+ hours per day of interdisciplinary therapy in a  comprehensive inpatient rehab setting.  Physiatrist is providing close team supervision and 24 hour management of active medical problems listed below.  Physiatrist and rehab team continue to assess barriers to discharge/monitor patient progress toward functional and medical goals   Care Tool:  Bathing  Bathing activity did not occur: Refused Body parts bathed by patient: Right arm, Left arm, Chest, Abdomen, Face, Right upper leg, Left upper leg   Body parts bathed by helper: Right lower leg, Left lower leg Body parts n/a: Front perineal area, Buttocks   Bathing assist Assist Level: Dependent - Patient 0%     Upper Body Dressing/Undressing Upper body dressing   What is the patient wearing?: Hospital gown only    Upper body assist Assist Level: Minimal Assistance - Patient > 75%    Lower Body Dressing/Undressing Lower body dressing      What is the patient wearing?: Pants     Lower body assist Assist for lower body dressing: Maximal Assistance - Patient 25 - 49%     Toileting Toileting    Toileting assist Assist for toileting: Total Assistance - Patient < 25%     Transfers Chair/bed transfer  Transfers assist  Chair/bed transfer activity did not occur: Safety/medical concerns  Chair/bed transfer assist level: Maximal Assistance - Patient 25 - 49%     Locomotion Ambulation   Ambulation assist   Ambulation activity did not  occur: Safety/medical concerns (R hemi, fatigue,generalized weakness, decresed balance/postural control)          Walk 10 feet activity   Assist  Walk 10 feet activity did not occur: Safety/medical concerns (R hemi, fatigue,generalized weakness, decresed balance/postural control)        Walk 50 feet activity   Assist Walk 50 feet with 2 turns activity did not occur: Safety/medical concerns (R hemi, fatigue,generalized weakness, decresed balance/postural control)         Walk 150 feet activity   Assist Walk 150 feet  activity did not occur: Safety/medical concerns (R hemi, fatigue,generalized weakness, decresed balance/postural control)         Walk 10 feet on uneven surface  activity   Assist Walk 10 feet on uneven surfaces activity did not occur: Safety/medical concerns (R hemi, fatigue,generalized weakness, decresed balance/postural control)         Wheelchair     Assist Will patient use wheelchair at discharge?: Yes Type of Wheelchair: Manual    Wheelchair assist level: Moderate Assistance - Patient 50 - 74% Max wheelchair distance: 28ft    Wheelchair 50 feet with 2 turns activity    Assist        Assist Level: Total Assistance - Patient < 25%   Wheelchair 150 feet activity     Assist      Assist Level: Dependent - Patient 0%    Medical Problem List and Plan: 1.  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 Pt s/p left CEA on 10/04/19.  Continue CIR 2.  Antithrombotics: -DVT/anticoagulation:  Pharmaceutical:  Heparin DC'd due to hemoglobin             -antiplatelet therapy: ASA/Plavix held initially, aspirin 81 restarted on 10/14 3. Pain Management:   As needed medications 4. Mood: LCSW to follow for evaluation and support.   Valium changed to qhs on 10/21, monitor for improvement in lethargy             -antipsychotic agents: N/A 5. Neuropsych: This patient is capable of making decisions on her own behalf. 6. Skin/Wound Care: Monitor incision for healing. Routine pressure relief measures.  7. Fluids/Electrolytes/Nutrition: Monitor I/Os.   Trial Marinol-fair, overall improved intake 8. HTN:   Inderal 60 mg bid  Monitor with increased mobility   Vitals:   10/24/19 1932 10/25/19 0439  BP: 134/64 (!) 142/68  Pulse: 84 72  Resp: 20 19  Temp: 98.8 F (37.1 C) 98.1 F (36.7 C)  SpO2: 96% 96%   Relatively controlled on 10/22 9.  CKD IIIa/b:   Creatinine 1.00 on 10/21, labs  ordered for Monday  Echo reviewed, EF 70-7 5%, IVF nightly started on 10/11, DC'd on 10/22  Encourage fluids  Continue to monitor 10. ABLA:   Hemoglobin 9.7 on 10/21  Transfuse 2 units of PRBCs on 10/11  Continue to monitor 11. Anxiety/depression:  On valium bid to help manage anxiety state.   Fluoxetine DC'd due to Plavix 12. IBS--diarrhea: Questran on hold 13.  Hyponatremia: Resolved  Sodium 139 on 10/21  Continue to monitor 14.  Severe hypoalbuminemia  Supplement initiated on 10/5 15. BMI 16.77- dietary consult for assess nutritional needs.  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  Acute lower UTI +/- PNA  WBC within normal limits on 10/13  Afebrile  UA equivocal, urine culture showing E. coli  Chest x-ray personally reviewed, showing left opacity,?  Atelectasis vs PNA  Cefepime changed to cefdinir, completed course on 10/21 18.  Left rectus hematoma/?  Retroperitoneal hematoma/?  Aortic bleeding  Seen on abdominal CT and discussed with radiology  Appreciate vascular recs -no interventions recommended at this time, can continue to monitor  Clinically stable/improving 19.  Tremor  Patient states she has had tremor for years 20.  Hypokalemia  Potassium 3.7 on 10/21, labs ordered for Monday  LOS: 18 days A FACE TO FACE EVALUATION WAS PERFORMED  Maija Biggers Karis Juba 10/25/2019, 1:12 PM

## 2019-10-26 ENCOUNTER — Ambulatory Visit (HOSPITAL_COMMUNITY): Payer: Medicare Other | Admitting: Physical Therapy

## 2019-10-26 NOTE — Progress Notes (Signed)
[place] PHYSICAL MEDICINE & REHABILITATION PROGRESS NOTE  Subjective/Complaints: Patient seen laying in bed this morning.  She states she slept well overnight.  She states she feels confused this morning, discussed with nursing.  ROS: Denies CP, SOB, N/V/D  Objective: Vital Signs: Blood pressure (!) 107/56, pulse 78, temperature 97.6 F (36.4 C), temperature source Oral, resp. rate 18, height 5\' 2"  (1.575 m), weight 43.6 kg, SpO2 92 %. No results found. Recent Labs    10/24/19 0555  WBC 7.0  HGB 9.7*  HCT 31.1*  PLT 569*   Recent Labs    10/24/19 0555  NA 139  K 3.7  CL 106  CO2 27  GLUCOSE 103*  BUN 14  CREATININE 1.00  CALCIUM 8.5*    Intake/Output Summary (Last 24 hours) at 10/26/2019 1528 Last data filed at 10/26/2019 1300 Gross per 24 hour  Intake 480 ml  Output --  Net 480 ml        Physical Exam: BP (!) 107/56 (BP Location: Left Arm)   Pulse 78   Temp 97.6 F (36.4 C) (Oral)   Resp 18   Ht 5\' 2"  (1.575 m)   Wt 43.6 kg   SpO2 92%   BMI 17.58 kg/m  Constitutional: No distress . Vital signs reviewed. HENT: Normocephalic.  Atraumatic. Eyes: EOMI. No discharge. Cardiovascular: No JVD.  RRR. Respiratory: Normal effort.  No stridor.  Bilateral clear to auscultation. GI: Non-distended.  BS +. Skin: Warm and dry.  Neck incision CDI Psych: Normal mood.  Normal behavior. Musc: No edema in extremities.  No tenderness in extremities. Neuro: Alert Resting head tremor, stable Motor: RUE: Shoulder abduction 2+/5, elbow flexion/extension 2 -/5, handgrip 1+/5, unchanged Right lower extremity: Hip flexion, knee extension 3-/5, ankle dorsiflexion 4+/5  Assessment/Plan: 1. Functional deficits secondary to left frontoparietal infarct which require 3+ hours per day of interdisciplinary therapy in a comprehensive inpatient rehab setting.  Physiatrist is providing close team supervision and 24 hour management of active medical problems listed  below.  Physiatrist and rehab team continue to assess barriers to discharge/monitor patient progress toward functional and medical goals   Care Tool:  Bathing  Bathing activity did not occur: Refused Body parts bathed by patient: Right arm, Left arm, Chest, Abdomen, Face, Right upper leg, Left upper leg   Body parts bathed by helper: Right lower leg, Left lower leg Body parts n/a: Front perineal area, Buttocks   Bathing assist Assist Level: Dependent - Patient 0%     Upper Body Dressing/Undressing Upper body dressing   What is the patient wearing?: Hospital gown only    Upper body assist Assist Level: Minimal Assistance - Patient > 75%    Lower Body Dressing/Undressing Lower body dressing      What is the patient wearing?: Pants     Lower body assist Assist for lower body dressing: Maximal Assistance - Patient 25 - 49%     Toileting Toileting    Toileting assist Assist for toileting: Total Assistance - Patient < 25%     Transfers Chair/bed transfer  Transfers assist  Chair/bed transfer activity did not occur: Safety/medical concerns  Chair/bed transfer assist level: Maximal Assistance - Patient 25 - 49%     Locomotion Ambulation   Ambulation assist   Ambulation activity did not occur: Safety/medical concerns (R hemi, fatigue,generalized weakness, decresed balance/postural control)          Walk 10 feet activity   Assist  Walk 10 feet activity did not occur: Safety/medical  concerns (R hemi, fatigue,generalized weakness, decresed balance/postural control)        Walk 50 feet activity   Assist Walk 50 feet with 2 turns activity did not occur: Safety/medical concerns (R hemi, fatigue,generalized weakness, decresed balance/postural control)         Walk 150 feet activity   Assist Walk 150 feet activity did not occur: Safety/medical concerns (R hemi, fatigue,generalized weakness, decresed balance/postural control)         Walk 10 feet  on uneven surface  activity   Assist Walk 10 feet on uneven surfaces activity did not occur: Safety/medical concerns (R hemi, fatigue,generalized weakness, decresed balance/postural control)         Wheelchair     Assist Will patient use wheelchair at discharge?: Yes Type of Wheelchair: Manual    Wheelchair assist level: Moderate Assistance - Patient 50 - 74% Max wheelchair distance: 57ft    Wheelchair 50 feet with 2 turns activity    Assist        Assist Level: Total Assistance - Patient < 25%   Wheelchair 150 feet activity     Assist      Assist Level: Dependent - Patient 0%    Medical Problem List and Plan: 1.  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 Pt s/p left CEA on 10/04/19.  Continue CIR 2.  Antithrombotics: -DVT/anticoagulation:  Pharmaceutical:  Heparin DC'd due to hemoglobin             -antiplatelet therapy: ASA/Plavix held initially, aspirin 81 restarted on 10/14 3. Pain Management:   As needed medications 4. Mood: LCSW to follow for evaluation and support.   Valium changed to qhs on 10/21, monitor for improvement in lethargy             -antipsychotic agents: N/A 5. Neuropsych: This patient is capable of making decisions on her own behalf. 6. Skin/Wound Care: Monitor incision for healing. Routine pressure relief measures.  7. Fluids/Electrolytes/Nutrition: Monitor I/Os.   Trial Marinol-fair, overall improved intake  Will discharge with medication 8. HTN:   Inderal 60 mg bid  Monitor with increased mobility   Vitals:   10/26/19 0348 10/26/19 1329  BP: 139/67 (!) 107/56  Pulse: 79 78  Resp: 16 18  Temp: 97.9 F (36.6 C) 97.6 F (36.4 C)  SpO2: 97% 92%   Relatively controlled on 10/23 9.  CKD IIIa/b:   Creatinine 1.00 on 10/21, labs ordered for Monday  Echo reviewed, EF 70-7 5%, IVF nightly started on 10/11, DC'd on 10/22  Encourage  fluids  Continue to monitor 10. ABLA:   Hemoglobin 9.7 on 10/21, labs ordered for Monday  Transfuse 2 units of PRBCs on 10/11  Continue to monitor 11. Anxiety/depression:  On valium bid to help manage anxiety state.   Fluoxetine DC'd due to Plavix 12. IBS--diarrhea: Questran on hold 13.  Hyponatremia: Resolved  Sodium 139 on 10/21  Continue to monitor 14.  Severe hypoalbuminemia  Supplement initiated on 10/5 15. BMI 16.77- dietary consult for assess nutritional needs.  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  Acute lower UTI +/- PNA  WBC within normal limits on 10/13  Afebrile  UA equivocal, urine culture showing E. coli  Chest x-ray personally reviewed, showing left opacity,?  Atelectasis vs PNA  Cefepime changed to cefdinir, completed course on 10/21 18.  Left rectus hematoma/?  Retroperitoneal hematoma/?  Aortic bleeding  Seen  on abdominal CT and discussed with radiology  Appreciate vascular recs -no interventions recommended at this time, can continue to monitor  Clinically stable 19.  Tremor  Patient states she has had tremor for years 20.  Hypokalemia  Potassium 3.7 on 10/21, labs ordered for Monday  LOS: 19 days A FACE TO FACE EVALUATION WAS PERFORMED  Lindzey Zent Karis Juba 10/26/2019, 3:28 PM

## 2019-10-26 NOTE — Progress Notes (Signed)
Physical Therapy Session Note  Patient Details  Name: Elizabeth Tapia MRN: [id number] Date of Birth: [birth year]  Today's Date: 10/26/2019 PT Individual Time: 1114-1226 PT Individual Time Calculation (min): 72 min   Short Term Goals: Week 3:  PT Short Term Goal 1 (Week 3): STG=LTG due to LOS  Skilled Therapeutic Interventions/Progress Updates:    Pt received supine, asleep in bed with her son,Elizabeth Tapia, and DIL, Elizabeth Tapia, present for additional family education/training. Upon awakening pt agreeable to therapy session. Therapist educated pt/family on decision for recommending K4 wheelchair as opposed to TIS to allow it to transport in normal vehicle and to allow pt increased independence with functional mobility at w/c level. Demonstrated wheelchair part management with loaner chair and educated pt's family on need to perform pt's dependent pressure relief via tilting patient back in w/c at least every hour for a full minute (family will need practice with this in another session). Discussed recommendation for follow-up HHPT. Supine donning pants via max assist for threading LEs and pulling up over hips. Supine>sitting R EOB, HOB elevated and using bedrail, with mod assist for pivoting hips and trunk upright - demos L posterior lean once in sitting requiring min progressed to close supervision for trunk control. Sitting EOB donned shirt max assist for time management. Therapist had pt's family set-up w/c for squat pivot transfer EOB>w/c with max cuing for proper position and brake management. Pt performed L squat pivot EOB>w/c with her son providing max/total assist and therapist providing close supervision - he demonstrated lifting pt into more of a stand pivot therefore therapist reiterated squat pivot technique with pt's son using the over the back technique due to his height difference with the pt - performed block practice 2x squat pivot w/c<>EOB with pt's son performing this technique and demos improved body  mechanics to decrease caregiver burden and risk for injury. Transported pt in w/c total assist for time management during session. Therapist educated pt/family on how to perform squat pivot car transfer technique using car simulator with therapist providing max/total assist 1st time to demonstrate for family - pt then repeated this with her son's assist with therapist providing mod cuing to ensure proper positioning of pt's LEs prior to initiating transfer and head/hips relationship. Transported to main entrance of hospital. Pt performed squat pivot car transfer to/from loaner w/c and pt's family's car with the son providing proper max/total assist with min cuing from therapist - at end therapist summarized important cues such as ensuring pt's LEs positioned correctly, allowing pt enough room to lean forward during transfer to protect her head, and need for always having +2 assist for safety and to stabilize wheelchair. Transported back up to room and pt requesting for assist back to bed. L squat pivot max/total assist. Sit>supine with max assist with pt fatigue. Left supine in bed with needs in reach and pt's family present.   Therapy Documentation Precautions:  Precautions Precautions: Fall Precaution Comments: R hemiparesis Restrictions Weight Bearing Restrictions: No  Pain: No reports of pain throughout session.   Therapy/Group: Individual Therapy  Elizabeth Tapia , PT, DPT, CSRS  10/26/2019, 8:01 AM

## 2019-10-27 ENCOUNTER — Inpatient Hospital Stay (HOSPITAL_COMMUNITY): Payer: Medicare Other | Admitting: Physical Therapy

## 2019-10-27 ENCOUNTER — Inpatient Hospital Stay (HOSPITAL_COMMUNITY): Payer: Medicare Other | Admitting: Speech Pathology

## 2019-10-27 NOTE — Progress Notes (Signed)
[place] PHYSICAL MEDICINE & REHABILITATION PROGRESS NOTE  Subjective/Complaints: Patient seen laying in bed this morning.  She states she slept well overnight.  She denies complaints.  She is looking forward to a day of relative rest.  ROS: Denies CP, SOB, N/V/D  Objective: Vital Signs: Blood pressure 135/66, pulse 80, temperature 98.8 F (37.1 C), resp. rate 14, height 5\' 2"  (1.575 m), weight 43.6 kg, SpO2 100 %. No results found. No results for input(s): WBC, HGB, HCT, PLT in the last 72 hours. No results for input(s): NA, K, CL, CO2, GLUCOSE, BUN, CREATININE, CALCIUM in the last 72 hours.  Intake/Output Summary (Last 24 hours) at 10/27/2019 1507 Last data filed at 10/27/2019 0700 Gross per 24 hour  Intake 180 ml  Output --  Net 180 ml        Physical Exam: BP 135/66 (BP Location: Left Arm)   Pulse 80   Temp 98.8 F (37.1 C)   Resp 14   Ht 5\' 2"  (1.575 m)   Wt 43.6 kg   SpO2 100%   BMI 17.58 kg/m   Constitutional: No distress . Vital signs reviewed. HENT: Normocephalic.  Atraumatic. Eyes: EOMI. No discharge. Cardiovascular: No JVD.  RRR. Respiratory: Normal effort.  No stridor.  Bilateral clear to auscultation. GI: Non-distended.  BS +. Skin: Warm and dry.  Neck incision CDI Psych: Normal mood.  Normal behavior. Musc: No edema in extremities.  No tenderness in extremities. Neuro: Alert Resting head tremor, unchanged Motor: RUE: Shoulder abduction 2+/5, elbow flexion/extension 2 -/5, handgrip 1+/5, stable Right lower extremity: Hip flexion, knee extension 3-/5, ankle dorsiflexion 4+/5  Assessment/Plan: 1. Functional deficits secondary to left frontoparietal infarct which require 3+ hours per day of interdisciplinary therapy in a comprehensive inpatient rehab setting.  Physiatrist is providing close team supervision and 24 hour management of active medical problems listed below.  Physiatrist and rehab team continue to assess barriers to discharge/monitor  patient progress toward functional and medical goals   Care Tool:  Bathing  Bathing activity did not occur: Refused Body parts bathed by patient: Right arm, Left arm, Chest, Abdomen, Face, Right upper leg, Left upper leg   Body parts bathed by helper: Right lower leg, Left lower leg Body parts n/a: Front perineal area, Buttocks   Bathing assist Assist Level: Dependent - Patient 0%     Upper Body Dressing/Undressing Upper body dressing   What is the patient wearing?: Hospital gown only    Upper body assist Assist Level: Minimal Assistance - Patient > 75%    Lower Body Dressing/Undressing Lower body dressing      What is the patient wearing?: Pants     Lower body assist Assist for lower body dressing: Maximal Assistance - Patient 25 - 49%     Toileting Toileting    Toileting assist Assist for toileting: Total Assistance - Patient < 25%     Transfers Chair/bed transfer  Transfers assist  Chair/bed transfer activity did not occur: Safety/medical concerns  Chair/bed transfer assist level: Maximal Assistance - Patient 25 - 49%     Locomotion Ambulation   Ambulation assist   Ambulation activity did not occur: Safety/medical concerns (R hemi, fatigue,generalized weakness, decresed balance/postural control)          Walk 10 feet activity   Assist  Walk 10 feet activity did not occur: Safety/medical concerns (R hemi, fatigue,generalized weakness, decresed balance/postural control)        Walk 50 feet activity   Assist Walk 50 feet  with 2 turns activity did not occur: Safety/medical concerns (R hemi, fatigue,generalized weakness, decresed balance/postural control)         Walk 150 feet activity   Assist Walk 150 feet activity did not occur: Safety/medical concerns (R hemi, fatigue,generalized weakness, decresed balance/postural control)         Walk 10 feet on uneven surface  activity   Assist Walk 10 feet on uneven surfaces activity did  not occur: Safety/medical concerns (R hemi, fatigue,generalized weakness, decresed balance/postural control)         Wheelchair     Assist Will patient use wheelchair at discharge?: Yes Type of Wheelchair: Manual    Wheelchair assist level: Moderate Assistance - Patient 50 - 74% Max wheelchair distance: 19ft    Wheelchair 50 feet with 2 turns activity    Assist        Assist Level: Total Assistance - Patient < 25%   Wheelchair 150 feet activity     Assist      Assist Level: Dependent - Patient 0%    Medical Problem List and Plan: 1.  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 Pt s/p left CEA on 10/04/19.  Continue CIR 2.  Antithrombotics: -DVT/anticoagulation:  Pharmaceutical:  Heparin DC'd due to hemoglobin             -antiplatelet therapy: ASA/Plavix held initially, aspirin 81 restarted on 10/14 3. Pain Management:   As needed medications 4. Mood: LCSW to follow for evaluation and support.   Valium changed to qhs on 10/21, monitor for improvement in lethargy             -antipsychotic agents: N/A 5. Neuropsych: This patient is capable of making decisions on her own behalf. 6. Skin/Wound Care: Monitor incision for healing. Routine pressure relief measures.  7. Fluids/Electrolytes/Nutrition: Monitor I/Os.   Trial Marinol-fair, overall improved intake  Will discharge with medication 8. HTN:   Inderal 60 mg bid  Monitor with increased mobility   Vitals:   10/27/19 0304 10/27/19 1416  BP: 129/67 135/66  Pulse: 77 80  Resp: 16 14  Temp: 98.5 F (36.9 C) 98.8 F (37.1 C)  SpO2: 96% 100%   Relatively controlled on 10/24 9.  CKD IIIa/b:   Creatinine 1.00 on 10/21, labs ordered for tomorrow  Echo reviewed, EF 70-7 5%, IVF nightly started on 10/11, DC'd on 10/22  Encourage fluids  Continue to monitor 10. ABLA:   Hemoglobin 9.7 on 10/21, labs ordered for  tomorrow  Transfuse 2 units of PRBCs on 10/11  Continue to monitor 11. Anxiety/depression:  On valium bid to help manage anxiety state.   Fluoxetine DC'd due to Plavix 12. IBS--diarrhea: Questran on hold 13.  Hyponatremia: Resolved  Sodium 139 on 10/21  Continue to monitor 14.  Severe hypoalbuminemia  Supplement initiated on 10/5 15. BMI 16.77- dietary consult for assess nutritional needs.  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  Acute lower UTI +/- PNA  WBC within normal limits on 10/13  Afebrile  UA equivocal, urine culture showing E. coli  Chest x-ray personally reviewed, showing left opacity,?  Atelectasis vs PNA  Cefepime changed to cefdinir, completed course on 10/21 18.  Left rectus hematoma/?  Retroperitoneal hematoma/?  Aortic bleeding  Seen on abdominal CT and discussed with radiology  Appreciate vascular recs -no interventions recommended at this time, can continue to monitor  Clinically stable 19.  Tremor  Patient states she has had tremor for years 20.  Hypokalemia  Potassium 3.7 on 10/21, labs ordered for tomorrow  LOS: 20 days A FACE TO FACE EVALUATION WAS PERFORMED  Argelia Formisano Karis Juba 10/27/2019, 3:07 PM

## 2019-10-27 NOTE — Progress Notes (Signed)
Physical Therapy Session Note  Patient Details  Name: Elizabeth Tapia MRN: [id number] Date of Birth: 10-07-1929  Today's Date: 10/27/2019 PT Individual Time: 1335-1430 PT Individual Time Calculation (min): 55 min   Short Term Goals: Week 3:  PT Short Term Goal 1 (Week 3): STG=LTG due to LOS  Skilled Therapeutic Interventions/Progress Updates:    Pt received seated in bed, agreeable to PT session. No complaints of pain. Pt reports urge to urinate. Supine to sit with mod A for trunk control and RLE management. Sit to stand with max A to stedy due to heavy posterior pushing. Stedy transfer to elevated BSC over toilet. Pt is max A for clothing management, setup A for pericare. Stedy transfer to w/c. Dependent transport via w/c to/from therapy gym for time conservation. Squat pivot transfer w/c to/from mat table x 4 reps with max A to total A, cues for hand placement and head/hips relationship during transfer. Pt requesting to redo her braid as her hair falling in her face. Pt is able to brush her hair with use of LUE, assisted pt with rebraiding her hair. Pt requests to return to bed at end of session. Squat pivot transfer back to bed with max A. Sit to supine mod A for BLE management. Pt left seated in bed with needs in reach, bed alarm in place at end of session.  Therapy Documentation Precautions:  Precautions Precautions: Fall Precaution Comments: R hemiparesis Restrictions Weight Bearing Restrictions: No   Therapy/Group: Individual Therapy   Peter Congo, PT, DPT  10/27/2019, 2:35 PM

## 2019-10-28 ENCOUNTER — Ambulatory Visit (HOSPITAL_COMMUNITY): Payer: Medicare Other

## 2019-10-28 ENCOUNTER — Encounter (HOSPITAL_COMMUNITY): Payer: Medicare Other | Admitting: Occupational Therapy

## 2019-10-28 ENCOUNTER — Inpatient Hospital Stay (HOSPITAL_COMMUNITY): Payer: Medicare Other | Admitting: Occupational Therapy

## 2019-10-28 ENCOUNTER — Encounter (HOSPITAL_COMMUNITY): Payer: Medicare Other | Admitting: Speech Pathology

## 2019-10-28 ENCOUNTER — Inpatient Hospital Stay: Payer: Medicare Other

## 2019-10-28 DIAGNOSIS — Z23 Encounter for immunization: Secondary | ICD-10-CM

## 2019-10-28 LAB — BASIC METABOLIC PANEL
Anion gap: 8 (ref 5–15)
BUN: 15 mg/dL (ref 8–23)
CO2: 29 mmol/L (ref 22–32)
Calcium: 9 mg/dL (ref 8.9–10.3)
Chloride: 103 mmol/L (ref 98–111)
Creatinine, Ser: 1.09 mg/dL — ABNORMAL HIGH (ref 0.44–1.00)
GFR, Estimated: 49 mL/min — ABNORMAL LOW (ref >60)
Glucose, Bld: 95 mg/dL (ref 70–99)
Potassium: 3.8 mmol/L (ref 3.5–5.1)
Sodium: 140 mmol/L (ref 135–145)

## 2019-10-28 LAB — CBC WITH DIFFERENTIAL/PLATELET
Abs Immature Granulocytes: 0.02 10*3/uL (ref 0.00–0.07)
Basophils Absolute: 0.1 10*3/uL (ref 0.0–0.1)
Basophils Relative: 1 %
Eosinophils Absolute: 0.4 10*3/uL (ref 0.0–0.5)
Eosinophils Relative: 6 %
HCT: 32 % — ABNORMAL LOW (ref 36.0–46.0)
Hemoglobin: 10 g/dL — ABNORMAL LOW (ref 12.0–15.0)
Immature Granulocytes: 0 %
Lymphocytes Relative: 21 %
Lymphs Abs: 1.5 10*3/uL (ref 0.7–4.0)
MCH: 29.2 pg (ref 26.0–34.0)
MCHC: 31.3 g/dL (ref 30.0–36.0)
MCV: 93.6 fL (ref 80.0–100.0)
Monocytes Absolute: 1 10*3/uL (ref 0.1–1.0)
Monocytes Relative: 13 %
Neutro Abs: 4.2 10*3/uL (ref 1.7–7.7)
Neutrophils Relative %: 59 %
Platelets: 625 10*3/uL — ABNORMAL HIGH (ref 150–400)
RBC: 3.42 MIL/uL — ABNORMAL LOW (ref 3.87–5.11)
RDW: 13.7 % (ref 11.5–15.5)
WBC: 7.2 10*3/uL (ref 4.0–10.5)
nRBC: 0 % (ref 0.0–0.2)

## 2019-10-28 MED ORDER — ASPIRIN EC 81 MG PO TBEC: 81.0000 mg | DELAYED_RELEASE_TABLET | Freq: Every day | ORAL | 0 refills | Status: AC

## 2019-10-28 NOTE — Progress Notes (Signed)
Occupational Therapy Discharge Summary  Patient Details  Name: Elizabeth Tapia MRN: [id number] Date of Birth: 1929/05/07   Patient has met 5 of 9 long term goals due to improved activity tolerance, improved balance, postural control, ability to compensate for deficits, improved awareness and improved coordination.  Patient to discharge at [facility] Max Assist level.  Patient's care partner is independent to provide the necessary physical and cognitive assistance at discharge. Pt is Max A for LB dressing at bed level, total A for toileting tasks and BSC transfers, and Max A for LB bathing at sink level. Family members primarily son Jeneen Rinks have been trained on squat pivot transfer techniques and how to perform hygiene/bathing/dressing for the pt as well as proper body mechanics to decrease caregiver burden. Pt's family also educated on PROM techniques and positioning to decrease risk of skin breakdown and contracture development. Also note the pt continues to have minimal AROM and functional use of RUE at this time.   Reasons goals not met: Pt continues to be Max-Total assist for toilet transfers to drop arm BSC, Max A for LB bathing at bed level with compensatory techniques, and total assist for toileting tasks including hygiene and clothing management.   Recommendation:  Patient will benefit from ongoing skilled OT services in home health setting to continue to advance functional skills in the area of BADL and Reduce care partner burden.  Equipment: drop arm BSC  Reasons for discharge: discharge from hospital and family/patient wishes to take the pt home since she has reached max potential  Patient/family agrees with progress made and goals achieved: Yes  OT Discharge Precautions/Restrictions  Precautions Precautions: Fall Precaution Comments: R hemiparesis Restrictions Weight Bearing Restrictions: No Vital Signs Therapy Vitals Temp: 99.8 F (37.7 C) Temp Source: Oral Pulse Rate:  73 Resp: 16 BP: (!) 159/68 Patient Position (if appropriate): Lying Oxygen Therapy SpO2: 99 % O2 Device: Room Air Pain Pain Assessment Pain Scale: 0-10 Pain Score: 0-No pain ADL ADL Eating: Supervision/safety Where Assessed-Eating: Chair Grooming: Supervision/safety Where Assessed-Grooming: Sitting at sink Upper Body Bathing: Minimal assistance Lower Body Bathing: Maximal assistance Upper Body Dressing: Moderate assistance Lower Body Dressing: Maximal assistance Where Assessed-Lower Body Dressing: Bed level Toileting: Dependent Where Assessed-Toileting: Bedside Commode Toilet Transfer: Dependent Toilet Transfer Method: Squat pivot Toilet Transfer Equipment: Drop arm bedside commode ADL Comments: Pt dressed during PT eval and declined bathing or dressing during eval/treatment session Vision Baseline Vision/History: Wears glasses Wears Glasses: At all times (not at hospital) Perception  Perception: Within Functional Limits Praxis Praxis: Intact Cognition Overall Cognitive Status: Impaired/Different from baseline Arousal/Alertness: Awake/alert Orientation Level: Oriented to person;Oriented to place;Oriented to situation Memory: Impaired Awareness: Impaired Problem Solving: Impaired Safety/Judgment: Impaired Comments: pt with poor safety awareness and insight into deficits Sensation Sensation Light Touch: Appears Intact Proprioception: Impaired by gross assessment Coordination Gross Motor Movements are Fluid and Coordinated: No Fine Motor Movements are Fluid and Coordinated: No Coordination and Movement Description: grossly uncoordinated due to R hemiparesis, pushing posteriorly and to the R, generalized weakness, decreased balance/postural control Motor  Motor Motor: Hemiplegia;Abnormal postural alignment and control;Abnormal tone Mobility  Transfers Sit to Stand: Moderate Assistance - Patient 50-74% Stand to Sit: Moderate Assistance - Patient 50-74%   Trunk/Postural Assessment  Cervical Assessment Cervical Assessment: Exceptions to [facility] Thoracic Assessment Thoracic Assessment: Exceptions to [name] Lumbar Assessment Lumbar Assessment: Exceptions to [facility] Postural Control Postural Control: Deficits on evaluation  Balance Balance Balance Assessed: Yes Static Sitting Balance Static Sitting - Balance Support: Feet supported;Bilateral upper extremity  supported Static Sitting - Level of Assistance: 5: Stand by assistance Dynamic Sitting Balance Dynamic Sitting - Balance Support: Feet supported;Bilateral upper extremity supported Dynamic Sitting - Level of Assistance: 5: Stand by assistance Static Standing Balance Static Standing - Balance Support: Bilateral upper extremity supported Static Standing - Level of Assistance: 4: Min assist (static standing at sink to simulate bathing tasks) Extremity/Trunk Assessment RUE Assessment RUE Assessment: Exceptions to [facility] Active Range of Motion (AROM) Comments: minimal shoulder flexion, elbow flexion to ~90*, limited elbow extension and minimal functional use, 4th digit contracture (old) General Strength Comments: shoulder and elbow flexion 1-2/5, some tone in wrist and fingers but limited functional use LUE Assessment LUE Assessment: Within Functional Limits Active Range of Motion (AROM) Comments: WNL General Strength Comments: grossly 4+/5   Viona Gilmore 10/28/2019, 5:08 PM

## 2019-10-28 NOTE — Progress Notes (Signed)
Speech Language Pathology Discharge Summary  Patient Details  Name: Elizabeth Tapia MRN: [id number] Date of Birth: [birth year]  Today's Date: 10/28/2019 SLP Individual Time: 1130-1200 SLP Individual Time Calculation (min): 30 min   Skilled Therapeutic Interventions:   Skilled treatment session focused on dysphagia goals and completion of family education with the patient's son. SLP facilitated session by providing skilled observation with trial tray of Dys.2 textures with thin liquids. Patient demonstrated mildly prolonged mastication with intermittent overt s/s of aspiration. Patient demonstrated emergent awareness in regards to difficulty with upgraded textures and reported she prefers to continue on Dys. 1 texture at this time. No overt s/s of aspiration with thin liquids noted. Recommend patient continue Dys. 1 textures with thin liquids via cup. Patient's son present and educated regarding patient's current swallowing function, diet recommendations, appropriate textures, swallowing compensatory strategies and medication administration. He verbalized understanding of all information and handouts were also given to reinforce information. Patient left upright in the wheelchair with son present.   Patient has met 5 of 7 long term goals.  Patient to discharge at overall Supervision;Min level.   Reasons goals not met: Patient continues to require overall Mod-Max A verbal cues for functional problem solving and recall with use of strategies.   Clinical Impression/Discharge Summary: Patient has made functional gains and has met 5 of 7 LTGs this admission. Currently, patient is consuming Dys. 1 textures with thin liquids with minimal overt s/s of aspiration and requires overall supervision level verbal cues for use of swallowing compensatory strategies. Patient also requires Min A verbal cues for sustained attention and emergent awareness with functional tasks and overall Max A multimodal cues for recall  and functional problem solving. Patient is 100% intelligible but continues to demonstrate a decreased vocal intensity requiring overall Min A verbal cues. Patient and family education is complete and patient will discharge home with 24 hour supervision from family. Patient would benefit from f/u SLP services to maximize her cognitive and swallowing function in order to reduce caregiver burden.   Care Partner:  Caregiver Able to Provide Assistance: Yes  Type of Caregiver Assistance: Physical;Cognitive  Recommendation:  Home Health SLP;24 hour supervision/assistance  Rationale for SLP Follow Up: Reduce caregiver burden;Maximize cognitive function and independence;Maximize swallowing safety   Equipment: N/A   Reasons for discharge: Discharged from hospital;Treatment goals met   Patient/Family Agrees with Progress Made and Goals Achieved: Yes    [place], [place] 10/28/2019, 12:36 PM

## 2019-10-28 NOTE — Discharge Instructions (Signed)
Inpatient Rehab Discharge Instructions  Elizabeth Tapia Discharge date and time:  10/29/19  Activities/Precautions/ Functional Status: Activity: no lifting, driving, or strenuous exercise for till cleared by MD Diet: cardiac diet--chopped foods Wound Care: keep wound clean and dry   Functional status:  ___ No restrictions     ___ Walk up steps independently _X__ 24/7 supervision/assistance   ___ Walk up steps with assistance ___ Intermittent supervision/assistance  ___ Bathe/dress independently ___ Walk with walker     ___ Bathe/dress with assistance ___ Walk Independently    ___ Shower independently ___ Walk with assistance    _X__ Shower with assistance _X__ No alcohol     ___ Return to work/school ________   Special Instructions 1. Offer fluids between meals. Offer 5 small meals or snacks between meals to help with nutritional status.  2. Family needs to assist with medication management.    COMMUNITY REFERRALS UPON DISCHARGE:    Home Health:   PT, OT, SP, AIDE                   Agency:[facility]   Phone: [phone]   Medical Equipment/Items Ordered:HOSPTIAL BED & [facility]  [phone]                                                   [facility]  [phone]  [facility] FAMILY TRANSPORTATION RESOURCES-[facility]    STROKE/TIA DISCHARGE INSTRUCTIONS SMOKING Cigarette smoking nearly doubles your risk of having a stroke & is the single most alterable risk factor  If you smoke or have smoked in the last 12 months, you are advised to quit smoking for your health.  Most of the excess cardiovascular risk related to smoking disappears within a year of stopping.  Ask you doctor about anti-smoking medications  [place] Quit Line: 1-800-QUIT NOW  Free Smoking Cessation Classes [phone]  CHOLESTEROL Know your levels; limit fat & cholesterol in your diet  Lipid Panel     Component Value Date/Time   CHOL 214 (H) 10/02/2019 0625   TRIG 75  10/02/2019 0625   HDL 44 10/02/2019 0625   CHOLHDL 4.9 10/02/2019 0625   VLDL 15 10/02/2019 0625   LDLCALC 155 (H) 10/02/2019 0625      Many patients benefit from treatment even if their cholesterol is at goal.  Goal: Total Cholesterol (CHOL) less than 160  Goal:  Triglycerides (TRIG) less than 150  Goal:  HDL greater than 40  Goal:  LDL (LDLCALC) less than 100   BLOOD PRESSURE American Stroke Association blood pressure target is less that 120/80 mm/Hg  Your discharge blood pressure is:  BP: (!) 151/71  Monitor your blood pressure  Limit your salt and alcohol intake  Many individuals will require more than one medication for high blood pressure  DIABETES (A1c is a blood sugar average for last 3 months) Goal HGBA1c is under 7% (HBGA1c is blood sugar average for last 3 months)  Diabetes:     Lab Results  Component Value Date   HGBA1C 5.7 (H) 10/01/2019     Your HGBA1c can be lowered with medications, healthy diet, and exercise.  Check your blood sugar as directed by your physician  Call your physician if you experience unexplained or low blood sugars.  PHYSICAL ACTIVITY/REHABILITATION Goal is 30 minutes at least 4 days per week  Activity: No driving, Therapies:  Return to work:   Activity decreases your risk of heart attack and stroke and makes your heart stronger.  It helps control your weight and blood pressure; helps you relax and can improve your mood.  Participate in a regular exercise program.  Talk with your doctor about the best form of exercise for you (dancing, walking, swimming, cycling).  DIET/WEIGHT Goal is to maintain a healthy weight  Your discharge diet is:  Diet Order            DIET DYS 2 Room service appropriate? Yes; Fluid consistency: Thin  Diet effective 1000               liquids Your height is:  Height: 5\' 2"  (157.5 cm) Your current weight is: Weight: 43.6 kg Your Body Mass Index (BMI) is:  BMI (Calculated): 17.58  Following the type  of diet specifically designed for you will help prevent another stroke.  Your goal weight is:    Your goal Body Mass Index (BMI) is 19-24.  Healthy food habits can help reduce 3 risk factors for stroke:  High cholesterol, hypertension, and excess weight.  RESOURCES Stroke/Support Group:  Call [phone]   STROKE EDUCATION PROVIDED/REVIEWED AND GIVEN TO PATIENT Stroke warning signs and symptoms How to activate emergency medical system (call 911). Medications prescribed at discharge. Need for follow-up after discharge. Personal risk factors for stroke. Pneumonia vaccine given:  Flu vaccine given:  My questions have been answered, the writing is legible, and I understand these instructions.  I will adhere to these goals & educational materials that have been provided to me after my discharge from the hospital.     My questions have been answered and I understand these instructions. I will adhere to these goals and the provided educational materials after my discharge from the hospital.  Patient/Caregiver Signature _______________________________ Date __________  Clinician Signature _______________________________________ Date __________  Please bring this form and your medication list with you to all your follow-up doctor's appointments.

## 2019-10-28 NOTE — Progress Notes (Signed)
Occupational Therapy Session Note  Patient Details  Name: Elizabeth Tapia MRN: [id number] Date of Birth: 08-Aug-1929  Today's Date: 10/28/2019 OT Individual Time: 4656-8127 and 5170-0174 OT Individual Time Calculation (min): 43 min and 74 min   Short Term Goals: Week 3:  OT Short Term Goal 1 (Week 3): Pt will complete toilet transfers max assist OT Short Term Goal 2 (Week 3): Pt will don pants with mod assist at sit > stand level OT Short Term Goal 3 (Week 3): Pt will utilize RUE as gross assist during self-care tasks OT Short Term Goal 4 (Week 3): Pt will don shirt with mod assist   Skilled Therapeutic Interventions/Progress Updates:    Pt greeted at time of session sitting up finishing PT session, son Elizabeth Tapia present for family ed. Pt already dressed at bed level, son stating he feels prepared to perform LB dressing at bed level at home. Discussed technique for threading BLEs into pants, rolling L and R with use of bed rails to don over hips as practiced in previous sessions. Since pt had just gotten dressed, simulated bathing tasks at sink level with sit to stand at sink level with Mod/max A and holding onto sink maintained static standing balance with Min/Mod. Demonstrated LB bathing for the pt for buttocks/pericare in standing and son verbalized understanding. Practiced w/c > drop arm BSC transfer, total A with posterior lean present, to determine if pt is safe to transfer wheelchair to [facility], and determined that gap is too wide and not safe for home transfers. Recommended bed > BSC if pt does need to use and son in agreement. Son performing squat pivot back to wheelchair with good carryover cues to use legs and not his back, cues for her feet placement. Positioned for comfort, alarm on and call bell in reach.   Session 2: Pt greeted at time of session sitting up in wheelchair agreeable to OT session, son Elizabeth Tapia also present. Pt and son both feeling good about performing squat pivot transfers at  home. Pt's son did have questions about removable back from wheelchair, pt squat pivot to recliner total A, removed seat back and demonstrated how to fold, son with demonstration as well and good carryover. Son squat pivot pt dependently back to wheelchair. Reviewed splint wearing at home to prevent contractures and deformity, also reviewed PROM for LUE and HEP provided later in session, son demonstrating good carryover and handling. Pt transported to gym via wheelchair and squat pivot to mat max - total A toward L side. Dynamic seated activities with light weight green dowel for chest press, bicep curl, forward circle all with therapist assist at RUE elbow and wrist. Weight bearing through RUE with lateral weight shifting at EOM. Squat pivot back to wheelchair total A to R side. Tranported back to room, alarm on, call bell in reach. Pt and family with no further questions.   Therapy Documentation Precautions:  Precautions Precautions: Fall Precaution Comments: R hemiparesis Restrictions Weight Bearing Restrictions: No     Therapy/Group: Individual Therapy  Erasmo Score 10/28/2019, 11:28 AM

## 2019-10-28 NOTE — Progress Notes (Signed)
Inpatient Rehabilitation Care Coordinator  Discharge Note  The overall goal for the admission was met for:   Discharge location: Yes-HOME WITH SON AND DAUGHTER IN-LAW 24/7 CARE  Length of Stay: Yes-22 DAYS  Discharge activity level: Yes-MIN-MOD LEVEL  Home/community participation: Yes  Services provided included: MD, RD, PT, OT, SLP, RN, CM, Pharmacy and SW  Financial Services: Medicare and Private Insurance: [employer]  Follow-up services arranged: Home Health: [facility] [place], DME: [facility] [place] and Patient/Family has no preference for HH/DME agencies  Bed and BSC delivered to home 10/25  Comments (or additional information):BOTH [name] AND FRAN-DAUGHTER IN-LA[place] CAME IN FOR EDUCATION AND FEEL COMFORTABLE WITH PT'S CARE NEEDS. WANT FOR HER TO BE AT HOME WHERE SHE IS COMFORTABLE AND HAPPIEST. GAVE TRANSPORTATION RESOURCES TO FOLLOW UP WITH IN [place]  Patient/Family verbalized understanding of follow-up arrangements: Yes  Individual responsible for coordination of the follow-up plan: [name]-SON [phone]  Confirmed correct DME delivered: Elease Hashimoto 10/28/2019    Elease Hashimoto

## 2019-10-28 NOTE — Progress Notes (Signed)
Physical Therapy Discharge Summary  Patient Details  Name: Elizabeth Tapia MRN: [id number] Date of Birth: [birth year]  Patient has met 2 of 8 long term goals due to improved activity tolerance.  Patient to discharge at a wheelchair level Max Assist.  Patient's care partner is independent to provide the necessary physical assistance at discharge. Pt to discharge home with son and daughter in law who can both provide 24/7 physical assist. Pt's son and daughter in law have been present for multiple family education training sessions and have verbalized and demonstrated confidence with all tasks/transfers to ensure safe discharge home. Pt's family has been educated on body mechanics, positioning, and safety techniques when providing hands on care. Pt and pt's son with no further questions regarding mobility.   Reasons goals not met: Pt did not meet goals due to R hemiparesis and tendency to push posteriorly and to the R with functional mobility. Pt also demonstrates strength deficits as well as decreased balance/postural control, poor motor planning and sequencing, poor safety awareness and insight into deficits, and poor activity tolerance.   Recommendation:  Patient will benefit from ongoing skilled PT services in home health setting to continue to advance safe functional mobility, address ongoing impairments in transfers, generalized strengthening, NMR, dynamic standing balance/coordination,endurance, and to minimize fall risk.  Equipment: hospital bed and custom wheelchair from [employer] (getting R half lap tray through [employer])   Reasons for discharge: lack of progress toward goals and discharge from hospital  Patient/family agrees with progress made and goals achieved: Yes  PT Discharge Precautions/Restrictions Precautions Precautions: Fall Precaution Comments: R hemiparesis Restrictions Weight Bearing Restrictions: No Cognition Overall Cognitive Status: Impaired/Different from  baseline Arousal/Alertness: Awake/alert Orientation Level: Oriented X4 Memory: Impaired Awareness: Impaired Problem Solving: Impaired Safety/Judgment: Impaired Comments: pt with poor safety awareness and insight into deficits Sensation Sensation Light Touch: Appears Intact Proprioception: Impaired by gross assessment Coordination Gross Motor Movements are Fluid and Coordinated: No Fine Motor Movements are Fluid and Coordinated: No Coordination and Movement Description: grossly uncoordinated due to R hemiparesis, pushing posteriorly and to the R, generalized weakness, decreased balance/postural control Finger Nose Finger Test: unable to lift R UE and mild dysmetria on L UE Motor  Motor Motor: Hemiplegia;Abnormal postural alignment and control;Abnormal tone Motor - Skilled Clinical Observations: R hemiparesis, pusher, generalized weakness, decreased balance/postural control  Mobility Bed Mobility Bed Mobility: Rolling Right;Rolling Left;Supine to Sit;Sit to Supine Rolling Right: Minimal Assistance - Patient > 75% Rolling Left: Minimal Assistance - Patient > 75% Supine to Sit: Moderate Assistance - Patient 50-74% Sit to Supine: Moderate Assistance - Patient 50-74% Transfers Transfers: Sit to Stand;Stand to Sit;Squat Pivot Transfers Sit to Stand: Moderate Assistance - Patient 50-74% Stand to Sit: Moderate Assistance - Patient 50-74% Squat Pivot Transfers: Maximal Assistance - Patient 25-49% Transfer (Assistive device): None Locomotion  Gait Ambulation: No Gait Gait: No Stairs / Additional Locomotion Stairs: No Wheelchair Mobility Wheelchair Mobility: Yes Wheelchair Assistance: Moderate Assistance - Patient 50 - 74% Wheelchair Propulsion: Left upper extremity;Left lower extremity Wheelchair Parts Management: Needs assistance Distance: 57ft  Trunk/Postural Assessment  Cervical Assessment Cervical Assessment: Exceptions to [facility] (forward head) Thoracic Assessment Thoracic  Assessment: Exceptions to [facility] (significant kyphosis) Lumbar Assessment Lumbar Assessment: Exceptions to [facility] (posterior pelvic tilt) Postural Control Postural Control: Deficits on evaluation  Balance Balance Balance Assessed: Yes Static Sitting Balance Static Sitting - Balance Support: Feet supported;Bilateral upper extremity supported Static Sitting - Level of Assistance: 5: Stand by assistance (supervision) Dynamic Sitting Balance Dynamic Sitting - Balance Support:  Feet supported;Bilateral upper extremity supported Dynamic Sitting - Level of Assistance: 5: Stand by assistance (CGA) Static Standing Balance Static Standing - Balance Support: Bilateral upper extremity supported (table) Static Standing - Level of Assistance: 4: Min assist Extremity Assessment  RLE Assessment RLE Assessment: Exceptions to [facility] General Strength Comments: grossly generalized to 3+/5 LLE Assessment LLE Assessment: Exceptions to [facility] General Strength Comments: grossly generalized to 3+/5   Alfonse Alpers PT, DPT  10/28/2019, 7:39 AM

## 2019-10-28 NOTE — Progress Notes (Signed)
[place] PHYSICAL MEDICINE & REHABILITATION PROGRESS NOTE  Subjective/Complaints: Says she woke up this morning confused where she is and this is unusual for her. Medications reviewed and she did not receive any new sedating medications- has been receiving Valium for nightly Denies pain, constipation, insomnia.   ROS: Denies CP, SOB, N/V/D  Objective: Vital Signs: Blood pressure (!) 151/71, pulse 75, temperature 97.6 F (36.4 C), temperature source Oral, resp. rate 16, height 5\' 2"  (1.575 m), weight 43.6 kg, SpO2 98 %. No results found. No results for input(s): WBC, HGB, HCT, PLT in the last 72 hours. No results for input(s): NA, K, CL, CO2, GLUCOSE, BUN, CREATININE, CALCIUM in the last 72 hours.  Intake/Output Summary (Last 24 hours) at 10/28/2019 0828 Last data filed at 10/27/2019 1950 Gross per 24 hour  Intake 170 ml  Output 0 ml  Net 170 ml        Physical Exam: BP (!) 151/71 (BP Location: Left Arm)   Pulse 75   Temp 97.6 F (36.4 C) (Oral)   Resp 16   Ht 5\' 2"  (1.575 m)   Wt 43.6 kg   SpO2 98%   BMI 17.58 kg/m    General: Alert and oriented x 3, No apparent distress HEENT: Head is normocephalic, atraumatic, PERRLA, EOMI, sclera anicteric, oral mucosa pink and moist, dentition intact, ext ear canals clear,  Neck: Supple without JVD or lymphadenopathy Heart: Reg rate and rhythm. No murmurs rubs or gallops Chest: CTA bilaterally without wheezes, rales, or rhonchi; no distress Abdomen: Soft, non-tender, non-distended, bowel sounds positive. Extremities: No clubbing, cyanosis, or edema. Pulses are 2+  Skin: Warm and dry.  Neck incision CDI Psych: Normal mood.  Normal behavior. Musc: No edema in extremities.  No tenderness in extremities. Neuro: Alert Resting head tremor, unchanged Motor: RUE: Shoulder abduction 2+/5, elbow flexion/extension 2 -/5, handgrip 1+/5, stable Right lower extremity: Hip flexion, knee extension 3-/5, ankle dorsiflexion  4+/5  Assessment/Plan: 1. Functional deficits secondary to left frontoparietal infarct which require 3+ hours per day of interdisciplinary therapy in a comprehensive inpatient rehab setting.  Physiatrist is providing close team supervision and 24 hour management of active medical problems listed below.  Physiatrist and rehab team continue to assess barriers to discharge/monitor patient progress toward functional and medical goals   Care Tool:  Bathing  Bathing activity did not occur: Refused Body parts bathed by patient: Right arm, Left arm, Chest, Abdomen, Face, Right upper leg, Left upper leg   Body parts bathed by helper: Right lower leg, Left lower leg Body parts n/a: Front perineal area, Buttocks   Bathing assist Assist Level: Dependent - Patient 0%     Upper Body Dressing/Undressing Upper body dressing   What is the patient wearing?: Hospital gown only    Upper body assist Assist Level: Minimal Assistance - Patient > 75%    Lower Body Dressing/Undressing Lower body dressing      What is the patient wearing?: Pants     Lower body assist Assist for lower body dressing: Maximal Assistance - Patient 25 - 49%     Toileting Toileting    Toileting assist Assist for toileting: Total Assistance - Patient < 25%     Transfers Chair/bed transfer  Transfers assist  Chair/bed transfer activity did not occur: Safety/medical concerns  Chair/bed transfer assist level: Maximal Assistance - Patient 25 - 49%     Locomotion Ambulation   Ambulation assist   Ambulation activity did not occur: Safety/medical concerns (R hemi, fatigue,generalized  weakness, decresed balance/postural control)          Walk 10 feet activity   Assist  Walk 10 feet activity did not occur: Safety/medical concerns (R hemi, fatigue,generalized weakness, decresed balance/postural control)        Walk 50 feet activity   Assist Walk 50 feet with 2 turns activity did not occur:  Safety/medical concerns (R hemi, fatigue,generalized weakness, decresed balance/postural control)         Walk 150 feet activity   Assist Walk 150 feet activity did not occur: Safety/medical concerns (R hemi, fatigue,generalized weakness, decresed balance/postural control)         Walk 10 feet on uneven surface  activity   Assist Walk 10 feet on uneven surfaces activity did not occur: Safety/medical concerns (R hemi, fatigue,generalized weakness, decresed balance/postural control)         Wheelchair     Assist Will patient use wheelchair at discharge?: Yes Type of Wheelchair: Manual    Wheelchair assist level: Moderate Assistance - Patient 50 - 74% Max wheelchair distance: 29ft    Wheelchair 50 feet with 2 turns activity    Assist        Assist Level: Total Assistance - Patient < 25%   Wheelchair 150 feet activity     Assist      Assist Level: Dependent - Patient 0%    Medical Problem List and Plan: 1.  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 Pt s/p left CEA on 10/04/19.  Continue CIR 2.  Antithrombotics: -DVT/anticoagulation:  Pharmaceutical:  Heparin DC'd due to hemoglobin             -antiplatelet therapy: ASA/Plavix held initially, aspirin 81 restarted on 10/14 3. Pain Management:   As needed medications 4. Mood: LCSW to follow for evaluation and support.   Valium changed to qhs on 10/21, monitor for improvement in lethargy.   10/25: not lethargic but states she awoke disoriented             -antipsychotic agents: N/A 5. Neuropsych: This patient is capable of making decisions on her own behalf. 6. Skin/Wound Care: Monitor incision for healing. Routine pressure relief measures.  7. Fluids/Electrolytes/Nutrition: Monitor I/Os.   Trial Marinol-fair, overall improved intake. Has not eaten much of breakfast on 10/25  Will discharge with medication 8. HTN:    Inderal 60 mg bid  Monitor with increased mobility   Vitals:   10/27/19 1936 10/28/19 0406  BP: 128/67 (!) 151/71  Pulse: 81 75  Resp: 20 16  Temp: 98.5 F (36.9 C) 97.6 F (36.4 C)  SpO2: 97% 98%   Slightly elevated 10/25 9.  CKD IIIa/b:   Creatinine 1.00 on 10/21, labs ordered for tomorrow  Echo reviewed, EF 70-7 5%, IVF nightly started on 10/11, DC'd on 10/22  Encourage fluids  Continue to monitor 10. ABLA:   Hemoglobin 9.7 on 10/21, labs ordered for tomorrow  Transfuse 2 units of PRBCs on 10/11  Continue to monitor 11. Anxiety/depression:  On valium bid to help manage anxiety state.   Fluoxetine DC'd due to Plavix 12. IBS--diarrhea: Questran on hold 13.  Hyponatremia: Resolved  Sodium 139 on 10/21  Continue to monitor 14.  Severe hypoalbuminemia  Supplement initiated on 10/5 15. BMI 16.77- dietary consult for assess nutritional needs.  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  Acute lower UTI +/- PNA  WBC within normal limits  on 10/13  Afebrile  UA equivocal, urine culture showing E. coli  Chest x-ray personally reviewed, showing left opacity,?  Atelectasis vs PNA  Cefepime changed to cefdinir, completed course on 10/21 18.  Left rectus hematoma/?  Retroperitoneal hematoma/?  Aortic bleeding  Seen on abdominal CT and discussed with radiology  Appreciate vascular recs -no interventions recommended at this time, can continue to monitor  Clinically stable 19.  Tremor  Patient states she has had tremor for years 20.  Hypokalemia  Potassium 3.7 on 10/21  LOS: 21 days A FACE TO FACE EVALUATION WAS PERFORMED  Clint Bolder P Ahmadou Bolz 10/28/2019, 8:28 AM

## 2019-10-28 NOTE — Discharge Summary (Signed)
Physician Discharge Summary  Patient ID: Elizabeth Tapia MRN: [id number] DOB/AGE: 84-Jul-1931 84 y.o.  Admit date: 10/07/2019 Discharge date: 10/29/2019  Discharge Diagnoses:  Principal Problem:   Cerebral infarction involving left carotid artery (HCC) Active Problems:   Hypoalbuminemia due to protein-calorie malnutrition (HCC)   Acute blood loss anemia   Essential hypertension   Right hemiparesis (HCC)   Protein-calorie malnutrition, severe   Dysphagia, post-stroke   AKI (acute kidney injury) (HCC)   Hematoma   Discharged Condition: stable   Significant Diagnostic Studies: CT ABDOMEN PELVIS WO CONTRAST  Result Date: 10/15/2019 CLINICAL DATA:  Acute abdominal pain EXAM: CT ABDOMEN AND PELVIS WITHOUT CONTRAST TECHNIQUE: Multidetector CT imaging of the abdomen and pelvis was performed following the standard protocol without IV contrast. COMPARISON:  02/07/2016 and abdominal ultrasound from 02/08/2016 FINDINGS: Lower chest: Left anterior descending and aortic atherosclerosis. Trace bilateral pleural effusions. Airspace opacity along the left hemidiaphragm and in the posterior basal segment left lower lobe likely with at least some degree of volume loss. Bandlike atelectasis in the right lower lobe. Hepatobiliary: Prominent lateral segment left hepatic lobe, cannot exclude cirrhosis. Cholecystectomy. Common bile duct approximately 9 mm in diameter. Pancreas: No contour abnormality of the pancreas is identified. Spleen: Unremarkable Adrenals/Urinary Tract: No hydronephrosis or hydroureter. Displacement of the urinary bladder by pelvic hematoma. Stomach/Bowel: Cannot exclude wall thickening in the rectum. Vascular/Lymphatic: Aortoiliac atherosclerotic vascular disease. Abnormal ill definition of periaortic soft tissues for example on image 41/3 could be from acute retroperitoneal hematoma or less likely confluent retroperitoneal adenopathy. The suspected retroperitoneal hematoma tracks along the  proximal iliac vessels and into the presacral region. Reproductive: Uterus absent. Left ovary somewhat obscured by adjacent hematoma. Other: Mesenteric stranding in the abdomen and pelvis. Musculoskeletal: Lower left rectus sheath hematoma, about 9.7 by 5.8 by 14.6 cm (volume = 430 cm^3), with adjacent hematoma tracking in the left pelvic sidewall and along the left obturator internus, and with mixed density hematoma in the space of Retzius displacing the bladder posteriorly into the right side. This additional hematoma along the pelvic sidewall and space of Retzius measures about 10.2 by 5.6 by 4.2 cm (volume = 130 cm^3). Subcutaneous edema is present particularly along the pubis tracking into the left upper thigh. No obvious masslike hematoma in the upper thigh regions. Remote compression fractures at T12 and L1. Likely late subacute fracture at T8 with about 60% loss of vertebral body height. IMPRESSION: 1. Large (430 cubic cm) left lower rectus sheath hematoma, with a 130 cubic cm hematoma tracking along the left pelvic sidewall and space of Retzius. There is also an abnormal rind of density around the abdominal aorta suspicious for retroperitoneal hematoma, acute aortic leak/hemorrhage is a distinct possibility. 2. Trace bilateral pleural effusions. Airspace opacity along the left hemidiaphragm and in the posterior basal segment left lower lobe likely with at least some degree of volume loss. 3. Remote compression fractures at T12 and L1. Likely late subacute fracture at T8 with about 60% loss of vertebral body height. 4. Prominent lateral segment left hepatic lobe, cannot exclude cirrhosis. 5. Cannot exclude wall thickening in the rectum. 6. Subcutaneous and mesenteric edema. 7. Aortic atherosclerosis. Aortic Atherosclerosis (ICD10-I70.0). Critical Value/emergent results were called by telephone at the time of interpretation on 10/15/2019 at 12:33 pm to provider Dr. Maryla Morrow, who verbally acknowledged these  results. Electronically Signed   By: Gaylyn Rong M.D.   On: 10/15/2019 12:33   DG CHEST PORT 1 VIEW  Result Date: 10/13/2019 CLINICAL DATA:  Leukocytosis EXAM: PORTABLE CHEST 1 VIEW COMPARISON:  February 07, 2016 FINDINGS: The cardiomediastinal silhouette is unchanged in contour.Tortuous thoracic aorta. Atherosclerotic calcifications of the aorta. No pleural effusion. No pneumothorax. New LEFT basilar linear opacity, most likely atelectasis. Surgical clips project over the RIGHT upper quadrant. Multilevel degenerative changes of the thoracic spine. IMPRESSION: New LEFT basilar linear opacity, most likely atelectasis. Infection remains in the differential. Electronically Signed   By: Meda Klinefelter MD   On: 10/13/2019 17:05   DG Swallowing Func-Speech Pathology  Result Date: 10/07/2019 Objective Swallowing Evaluation: Type of Study: MBS-Modified Barium Swallow Study  Patient Details Name: Elizabeth Tapia MRN: [id number] Date of Birth: 1929-10-03 Today's Date: 10/07/2019 Time: SLP Start Time (ACUTE ONLY): 1347 -SLP Stop Time (ACUTE ONLY): 1405 SLP Time Calculation (min) (ACUTE ONLY): 18 min Past Medical History: Past Medical History: Diagnosis Date  Anxiety   CKD (chronic kidney disease)   Depression   Essential tremor   HTN (hypertension)   IBS (irritable bowel syndrome)   with diarrhea Past Surgical History: Past Surgical History: Procedure Laterality Date  ENDARTERECTOMY Left 10/04/2019  Procedure: ENDARTERECTOMY CAROTID;  Surgeon: Chuck Hint, MD;  Location: [facility] OR;  Service: Vascular;  Laterality: Left;  EYE SURGERY   HPI:  Elizabeth Tapia is an 86 year old female with a history of hypertension, hyperlipidemia, CKD IIIa, ?rheumatoid arthritis, anxiety, and depression who presents with an acute stroke. Patient was in her usual state of health last night and awoke this morning with slurred speech and R sided weakness. She was unable to walk. She lives with her grandson and called to him  for help. She has no history of prior stroke or known vascular disease.   10/01/19 MRI head indicated: This demonstrates multiple foci of acute infarcts within the left frontal and parietal lobes in a watershed distribution.  Underwent left CEA 10/04/19.  Subjective: pt alert, cooperative Assessment / Plan / Recommendation CHL IP CLINICAL IMPRESSIONS 10/07/2019 Clinical Impression Pt has a mild pharyngeal and cervical esophageal dysphagia, with oral phase relatively functional. She has reduced base of tongue retraction and anterior hyoid movement, with reduced epiglottic inversion and laryngeal vestibule closure. Her UES opening also appears to be somewhat reduced and there is an outpouching in which barium collects during the swallow. Backflow occurs between the opening of the UES and the location of this outpouching. Of note, barium was also noted to remain more distally in the esophagus in a more tortuous looking shape, with areas that also appeared to be dilated at rest.  Pt has moderate amounts of vallecular residue that does not clear her pharynx during the swallow that is more prevalent with solids than with liquids. Trace amounts of thin liquids also enter the airway with larger boluses. Aspiration occurred with a spontaneous cough response, which was also elicited when penetration reached the level of the vocal folds. Her sensation appears to be good considering this response to trace stimulation, as well as her spontaneous subswallows that she performs to reduce (but not clear) the residue from her pharynx. Pt can achieve better efficiency with cues for hard swallows, and she can protect her airway better when taking small, single sips via cup. Recommend continuing current diet with use of these swallowing strategies to maximize safety.   SLP Visit Diagnosis Dysphagia, pharyngoesophageal phase (R13.14) Attention and concentration deficit following -- Frontal lobe and executive function deficit following --  Impact on safety and function Mild aspiration risk   CHL IP TREATMENT RECOMMENDATION 10/07/2019 Treatment  Recommendations Therapy as outlined in treatment plan below   Prognosis 10/07/2019 Prognosis for Safe Diet Advancement Good Barriers to Reach Goals Time post onset Barriers/Prognosis Comment -- CHL IP DIET RECOMMENDATION 10/07/2019 SLP Diet Recommendations Dysphagia 3 (Mech soft) solids;Thin liquid Liquid Administration via Cup;No straw Medication Administration Crushed with puree Compensations Slow rate;Small sips/bites;Effortful swallow Postural Changes Seated upright at 90 degrees;Remain semi-upright after after feeds/meals (Comment)   CHL IP OTHER RECOMMENDATIONS 10/07/2019 Recommended Consults -- Oral Care Recommendations Oral care BID Other Recommendations --   CHL IP FOLLOW UP RECOMMENDATIONS 10/07/2019 Follow up Recommendations Inpatient Rehab   CHL IP FREQUENCY AND DURATION 10/07/2019 Speech Therapy Frequency (ACUTE ONLY) min 2x/week Treatment Duration 2 weeks      CHL IP ORAL PHASE 10/07/2019 Oral Phase WFL Oral - Pudding Teaspoon -- Oral - Pudding Cup -- Oral - Honey Teaspoon -- Oral - Honey Cup -- Oral - Nectar Teaspoon -- Oral - Nectar Cup -- Oral - Nectar Straw -- Oral - Thin Teaspoon -- Oral - Thin Cup -- Oral - Thin Straw -- Oral - Puree -- Oral - Mech Soft -- Oral - Regular -- Oral - Multi-Consistency -- Oral - Pill -- Oral Phase - Comment --  CHL IP PHARYNGEAL PHASE 10/07/2019 Pharyngeal Phase Impaired Pharyngeal- Pudding Teaspoon -- Pharyngeal -- Pharyngeal- Pudding Cup -- Pharyngeal -- Pharyngeal- Honey Teaspoon -- Pharyngeal -- Pharyngeal- Honey Cup -- Pharyngeal -- Pharyngeal- Nectar Teaspoon -- Pharyngeal -- Pharyngeal- Nectar Cup -- Pharyngeal -- Pharyngeal- Nectar Straw -- Pharyngeal -- Pharyngeal- Thin Teaspoon -- Pharyngeal -- Pharyngeal- Thin Cup Reduced tongue base retraction;Reduced epiglottic inversion;Reduced anterior laryngeal mobility;Reduced airway/laryngeal closure;Pharyngeal residue  - valleculae;Penetration/Aspiration before swallow Pharyngeal Material enters airway, passes BELOW cords then ejected out Pharyngeal- Thin Straw Reduced tongue base retraction;Reduced epiglottic inversion;Reduced anterior laryngeal mobility;Reduced airway/laryngeal closure;Pharyngeal residue - valleculae;Penetration/Aspiration during swallow Pharyngeal Material enters airway, CONTACTS cords and not ejected out Pharyngeal- Puree Reduced tongue base retraction;Reduced epiglottic inversion;Reduced anterior laryngeal mobility;Reduced airway/laryngeal closure;Pharyngeal residue - valleculae Pharyngeal -- Pharyngeal- Mechanical Soft Reduced tongue base retraction;Reduced epiglottic inversion;Reduced anterior laryngeal mobility;Reduced airway/laryngeal closure;Pharyngeal residue - valleculae Pharyngeal -- Pharyngeal- Regular -- Pharyngeal -- Pharyngeal- Multi-consistency -- Pharyngeal -- Pharyngeal- Pill -- Pharyngeal -- Pharyngeal Comment --  CHL IP CERVICAL ESOPHAGEAL PHASE 10/07/2019 Cervical Esophageal Phase Impaired Pudding Teaspoon -- Pudding Cup -- Honey Teaspoon -- Honey Cup -- Nectar Teaspoon -- Nectar Cup -- Nectar Straw -- Thin Teaspoon -- Thin Cup Esophageal backflow into cervical esophagus Thin Straw Esophageal backflow into cervical esophagus Puree Esophageal backflow into cervical esophagus Mechanical Soft Esophageal backflow into cervical esophagus Regular -- Multi-consistency -- Pill -- Cervical Esophageal Comment -- Mahala Menghini., M.A. CCC-SLP Acute Rehabilitation Services Pager [phone] Office [phone] 10/07/2019, 3:39 PM                Labs:  Basic Metabolic Panel: BMP Latest Ref Rng & Units 10/28/2019 10/24/2019 10/21/2019  Glucose 70 - 99 mg/dL 95 657(Q) 469(G)  BUN 8 - 23 mg/dL 15 14 16   Creatinine 0.44 - 1.00 mg/dL ) 2.95(M 8.41  Sodium 135 - 145 mmol/L 140 139 138  Potassium 3.5 - 5.1 mmol/L 3.8 3.7 3.4(L)  Chloride 98 - 111 mmol/L 103 106 104  CO2 22 - 32 mmol/L 29 27 25    Calcium 8.9 - 10.3 mg/dL 9.0 3.24) 8.3(L)    CBC: CBC Latest Ref Rng & Units 10/28/2019 10/24/2019 10/23/2019  WBC 4.0 - 10.5 K/uL 7.2 7.0 7.8  Hemoglobin 12.0 - 15.0 g/dL 10.0(L) 9.7(L) 9.3(L)  Hematocrit 36 - 46 % 32.0(L) 31.1(L) 30.1(L)  Platelets 150 - 400 K/uL 625(H) 569(H) 568(H)    CBG: No results for input(s): GLUCAP in the last 168 hours.  Brief HPI:   Elizabeth Tapia is a 86 y.o. female with history of HTN, CKD, anxiety/depression, tremors; was admitted on 10/01/2019 with dysarthria and right hemiparesis.  CTA head/neck showed critical stenosis L-ICA greater than 90% with string sign, hypoperfusion left parietal lobe with possible distal arterial occlusion left MCA.  MRI brain done showing multiple foci of acute infarcts in left frontal and parietal lobe in watershed distribution.  She was started on IV heparin and left carotid endarterectomy recommended by Dr. Durwin Nora.  She underwent left CEA on 10/01 and.  Started on ASA/Plavix as well as Cleviprex for BP control.  She was found to have signs of dysphagia therefore MBS done for objective evaluation with recommendation of dysphagia 3 diet.  Issues with anxiety and tachycardia managed with resumption of propanolol as well as Valium.  She continued to have impairments due to RUE weakness with balance deficits, difficulty with sequencing as well as dysphagia affecting ADLs ad activity.  CIR was recommended due to functional decline.   Hospital Course: Elizabeth Tapia was admitted to rehab 10/07/2019 for inpatient therapies to consist of PT, ST and OT at least three hours five days a week. Past admission physiatrist, therapy team and rehab RN have worked together to provide customized collaborative inpatient rehab. Hospital course significant for development of acute blood loss anemia with drop in H&H to 5.3/17.1 on 10/11.  She was transfused with 2 units packed PRBC.  She developed abdominal pain on 10/12 and CT abdomen pelvis done revealing  large rectus sheath hematoma with abnormal density of around abdominal aorta suspicious for aortic leak as well as airspace opacity left lower lobe.   Dr. Durwin Nora was consulted for input and recommended conservative management including holding aspirin Plavix and DC of subcu heparin.  He did not feel that aorta looks aneurysmal.  She continued to report fatigue and was found to have E. coli UTI and was on cefepime to cover bladder as well as lung due to question of left PNA.  She completed 10-day course of antibiotic regimen without side effects.CBC was monitored with daily checks and showed H&H to be stable and slowly improving.  However p.o. intake continues to be poor therefore Marinol was added as stimulant.  Nutritional supplements were also offered between meals.   Her blood pressures were monitored on TID basis and and has been controlled on Inderal twice daily.  She was found to have acute on chronic renal failure and was treated with IV fluids briefly.  She was also encouraged to increase fluid intake and serial check of labs shows renal status back to baseline and hyponatremia has resolved therefore fluids were discontinued.  She has made slow progress during her stay and continues to be right hemiparesis tendencies as well as issues with apraxia and poor activity tolerance.  She requires mod assist at wheelchair level and will continue to receive follow-up HHPT, HHOT, HHST, HHRN and aide by [facility] after discharge.    Rehab course: During patient's stay in rehab weekly team conferences were held to monitor patient's progress, set goals and discuss barriers to discharge. At admission, patient required max to total assist with mobility and basic ADL task.  She exhibited moderate cognitive impairments with task completion, severe impairments in short-term memory and low voice with intermittent hoarse/wet quality.  She has had improvement in activity tolerance, balance, postural control as well as  ability to compensate for deficits. She requires max assist for lower body bathing and dressing task as well as total assist for toileting.  She requires max assist for mobility at wheelchair level. She requires mod to max assist with verbal cues for functional problem-solving and for recall with use of strategies.  She is tolerating dysphagia 3 diet without signs or symptoms of aspiration.  Family education was completed regarding all aspects of safety, care and cognitive assistance needed.  Discharge disposition: 01-Home or Self Care  Diet: Dysphagia 3, thin liquids   Special Instructions: 1.  Family to offer supplements between meals as well as encourage fluid intake.    Discharge Instructions    Ambulatory referral to Physical Medicine Rehab   Complete by: As directed    1-2 weeks TC follow up     Allergies as of 10/29/2019   No Known Allergies     Medication List    STOP taking these medications   clopidogrel 75 MG tablet Commonly known as: PLAVIX     TAKE these medications   acetaminophen 500 MG tablet Commonly known as: TYLENOL Take 500 mg by mouth every 6 (six) hours as needed for mild pain.   aspirin EC 81 MG tablet Take 1 tablet (81 mg total) by mouth daily. What changed: how much to take   atorvastatin 80 MG tablet Commonly known as: LIPITOR Take 1 tablet (80 mg total) by mouth daily.   diazepam 2 MG tablet Commonly known as: VALIUM Take 1 tablet (2 mg total) by mouth at bedtime. What changed:   when to take this  reasons to take this   dronabinol 2.5 MG capsule--Rx #30 pills Commonly known as: MARINOL Take 1 capsule (2.5 mg total) by mouth daily before lunch.   FLUoxetine 10 MG capsule Commonly known as: PROZAC Take 1 capsule (10 mg total) by mouth daily.   pantoprazole 40 MG tablet Commonly known as: PROTONIX Take 1 tablet (40 mg total) by mouth daily.   propranolol 60 MG tablet Commonly known as: INDERAL Take 1 tablet (60 mg total) by  mouth 2 (two) times daily.       Follow-up Information    Tarri Fuller, MD. Call in 1 day(s).   Specialty: Family Medicine Why: for post hospital follow up Contact information: [address] [place] [address] [phone]        Marcello Fennel, MD Follow up.   Specialty: Physical Medicine and Rehabilitation Why: Office will call you with follow up appointment Contact information: [address] [place] [address] [phone]        [facility] Follow up.   Why: for post stroke follow up Contact information: [address]     [address] [place] [address] [phone]       Chuck Hint, MD Follow up on 11/13/2019.   Specialties: Vascular Surgery, Cardiology Why: Appointment at 3:40 pm Contact information: [address] [place] [address] [phone]               Signed: Jacquelynn Cree 10/30/2019, 6:33 PM

## 2019-10-28 NOTE — Progress Notes (Signed)
Physical Therapy Session Note  Patient Details  Name: Elizabeth Tapia MRN: [id number] Date of Birth: 01-17-1929  Today's Date: 10/28/2019 PT Individual Time: 1000-1043 PT Individual Time Calculation (min): 43 min   Short Term Goals: Week 2:  PT Short Term Goal 1 (Week 2): Pt will perform bed mobility with supervision consistantly PT Short Term Goal 1 - Progress (Week 2): Progressing toward goal PT Short Term Goal 2 (Week 2): Pt wil transfer sit<>stand with LRAD mod A consistantly PT Short Term Goal 2 - Progress (Week 2): Progressing toward goal PT Short Term Goal 3 (Week 2): Pt will perform simulated car transfer with LRAD mod A PT Short Term Goal 3 - Progress (Week 2): Not progressing Week 3:  PT Short Term Goal 1 (Week 3): STG=LTG due to LOS  Skilled Therapeutic Interventions/Progress Updates:   Received pt supine in bed with pt's son present for family education training. Pt denied any pain during session. Session with emphasis on discharge planning, bed mobility, dressing, toileting, functional mobility/transfers, and improved activity tolerance. Pt reported feeling wet and noted pt with soiled brief. Pt's son expressed that he hadn't practiced much with toileting and dressing. Educated pt's son on toileting, hygiene, and dressing from bed level. Doffed dirty brief and pt rolled L and R with min A x 4 trials from therapist and from pt's son and required total A for peri-care. Pt's son assisted with donning clean brief and pants with total A with cues from therapist for body mechanics and safety. Pt transferred supine<>sitting EOB with mod A from pt's son with cues for hand placement and technique. Doffed dirty shirt and donned clean one with max A from pt's son with cues from therapist for hemi-technique. Pt's son set up W/C for squat<>pivot transfer from bed<>WC with max A demonstrating correct body mechanics and positioning with transfer. Pt's son verbalized confidence with car transfer and  bumping pt up/down stairs in [facility] and declined practicing. Pt/pt's son with no further questions regarding discharge. Concluded session with pt sitting in [facility], needs within reach, and son present at bedside, handoff to OT.   Therapy Documentation Precautions:  Precautions Precautions: Fall Precaution Comments: R hemiparesis Restrictions Weight Bearing Restrictions: No   Therapy/Group: Individual Therapy Martin Majestic PT, DPT   10/28/2019, 7:44 AM

## 2019-10-28 NOTE — Progress Notes (Signed)
   Covid-19 Vaccination Clinic  Name:  Jamilex Bohnsack    MRN: [id number] DOB: 1929/11/28  10/28/2019  Ms. Winnie was observed post Covid-19 immunization for 15 minutes without incident. She was provided with Vaccine Information Sheet and instruction to access the V-Safe system.   Ms. Balgobin was instructed to call 911 with any severe reactions post vaccine: Marland Kitchen Difficulty breathing  . Swelling of face and throat  . A fast heartbeat  . A bad rash all over body  . Dizziness and weakness   Immunizations Administered    Name Date Dose VIS Date Route   Moderna COVID-19 Vaccine 10/28/2019 12:24 PM 0.5 mL 12/2018 Intramuscular   Manufacturer: Moderna   Lot: [id number]   NDC: 26203-559-74

## 2019-10-28 NOTE — Progress Notes (Signed)
Patient ID: Elizabeth Tapia, female   DOB: 06/27/1929, 86 y.o.   MRN: [id number]  Met with pt and son who is here for family education. Both report it is going well and looking forward to discharge tomorrow. Son has questions regarding the wheelchair and how to fold it to get home. Hannah-OT to show this afternoon at their session, so will be prepared tomorrow for discharge. He feels both he and his wife can provide the care pt requires at home. Daughter in-law is at home awaiting the delivery of the hospital bed and drop-arm bedside commode. Will see in am for any last minute questions or concerns.

## 2019-10-29 ENCOUNTER — Other Ambulatory Visit: Payer: Self-pay | Admitting: Physical Medicine and Rehabilitation

## 2019-10-29 MED ORDER — DRONABINOL 2.5 MG PO CAPS: 2.5000 mg | ORAL_CAPSULE | Freq: Every day | ORAL | 0 refills | Status: AC

## 2019-10-29 MED ORDER — PROPRANOLOL HCL 60 MG PO TABS: 60.0000 mg | ORAL_TABLET | Freq: Two times a day (BID) | ORAL | 0 refills | Status: AC

## 2019-10-29 MED ORDER — ATORVASTATIN CALCIUM 80 MG PO TABS: 80.0000 mg | ORAL_TABLET | Freq: Every day | ORAL | 0 refills | Status: AC

## 2019-10-29 MED ORDER — PANTOPRAZOLE SODIUM 40 MG PO TBEC: 40.0000 mg | DELAYED_RELEASE_TABLET | Freq: Every day | ORAL | 0 refills | Status: AC

## 2019-10-29 MED ORDER — DIAZEPAM 2 MG PO TABS: 2.0000 mg | ORAL_TABLET | Freq: Every day | ORAL | 0 refills | Status: AC

## 2019-10-29 MED ORDER — FLUOXETINE HCL 10 MG PO CAPS: 10.0000 mg | ORAL_CAPSULE | Freq: Every day | ORAL | 0 refills | Status: DC

## 2019-10-29 NOTE — Plan of Care (Signed)
  Problem: Consults Goal: Nutrition Consult-if indicated Outcome: Completed/Met   Problem: RH BOWEL ELIMINATION Goal: RH STG MANAGE BOWEL W/MEDICATION W/ASSISTANCE Description: STG Manage Bowel with Medication with Mod I Assistance. Outcome: Completed/Met   Problem: RH BLADDER ELIMINATION Goal: RH STG MANAGE BLADDER WITH ASSISTANCE Description: STG Manage Bladder With Mod I Assistance Outcome: Completed/Met   Problem: RH SKIN INTEGRITY Goal: RH STG SKIN FREE OF INFECTION/BREAKDOWN Description: Mod i Outcome: Completed/Met   Problem: RH SAFETY Goal: RH STG ADHERE TO SAFETY PRECAUTIONS W/ASSISTANCE/DEVICE Description: STG Adhere to Safety Precautions With Mod I Assistance/Device. Outcome: Completed/Met Goal: RH STG DECREASED RISK OF FALL WITH ASSISTANCE Description: STG Decreased Risk of Fall With Mod I Assistance. Outcome: Completed/Met   Problem: RH KNOWLEDGE DEFICIT Goal: RH STG INCREASE KNOWLEDGE OF DYSPHAGIA/FLUID INTAKE Description: Mod I Outcome: Completed/Met   Problem: Consults Goal: RH STROKE PATIENT EDUCATION Description: See Patient Education module for education specifics  Outcome: Completed/Met

## 2019-10-29 NOTE — Progress Notes (Signed)
Patient discharged off of unit with all belongings. Discharge papers/instructions explained by physician assistant to family. Patient and family have no further questions at time of discharge. No complications noted at this time.  Kashawn Dirr L Rashad Auld  

## 2019-10-30 ENCOUNTER — Encounter: Payer: Medicare Other | Admitting: Vascular Surgery

## 2019-10-30 ENCOUNTER — Other Ambulatory Visit: Payer: Self-pay | Admitting: Physical Medicine and Rehabilitation

## 2019-10-31 ENCOUNTER — Telehealth: Payer: Self-pay | Admitting: Registered Nurse

## 2019-10-31 NOTE — Telephone Encounter (Signed)
Placed a call to Mr. Griffey ( son), no answer. Left message to return the call.

## 2019-11-01 ENCOUNTER — Telehealth: Payer: Self-pay | Admitting: Registered Nurse

## 2019-11-01 NOTE — Telephone Encounter (Signed)
Received a call from Ms. [name]: Daughter in Social worker of Ms. Corky Crafts. She reports the medication she was referring to was acetaminophen. Explained to her this was Tylenol, she verbalizes understanding. This provider also placed a call to [facility] and left message for them to call Ms. [name] to schedule an appointment for Ms. Corky Crafts.

## 2019-11-01 NOTE — Telephone Encounter (Signed)
Transitional Care call Transitional Questions answered by Daughter in Monico Hoar   Patient name: Elizabeth Tapia  DOB: April 12, 1929 1. Are you/is patient experiencing any problems since coming home? No a. Are there any questions regarding any aspect of care? No 2. Are there any questions regarding medications administration/dosing? Yes, she states she hasn't received one of the medications, couldn't recall which one. She will speak with her husband and call this provider with medication name.  a. Are meds being taken as prescribed? Yes, all except the above, awaiting a return call.  b. "Patient should review meds with caller to confirm" Medication List Reviewed. 3. Have there been any falls? No 4. Has Home Health been to the house and/or have they contacted you?Yes: [facility] a. If not, have you tried to contact them? NA b. Can we help you contact them? NA 5. Are bowels and bladder emptying properly? Yes a. Are there any unexpected incontinence issues? No b. If applicable, is patient following bowel/bladder programs? (                          ) 6. Any fevers, problems with breathing, unexpected pain?No 7. Are there any skin problems or new areas of breakdown? No 8. Has the patient/family member arranged specialty MD follow up (ie cardiology/neurology/renal/surgical/etc.)?  She was instructed to call [facility] to schedule HFU appointment. The other appointments are scheduled, she verbalizes understanding.  a. Can we help arrange? No 9. Does the patient need any other services or support that we can help arrange? No 10. Are caregivers following through as expected in assisting the patient? Yes 11. Has the patient quit smoking, drinking alcohol, or using drugs as recommended? (                        )  Appointment date/time 11/12/2019  arrival time 10:20 for 10:40 appointment with Dr Allena Katz. At  [address] [facility] [address]

## 2019-11-04 ENCOUNTER — Telehealth: Payer: Self-pay

## 2019-11-04 NOTE — Telephone Encounter (Signed)
Jae Dire, PT from [facility] called requesting verbal orders 1wk9. Orders approved and given per discharge summary.

## 2019-11-12 ENCOUNTER — Encounter: Payer: Self-pay | Admitting: Physical Medicine & Rehabilitation

## 2019-11-12 ENCOUNTER — Other Ambulatory Visit: Payer: Self-pay

## 2019-11-12 ENCOUNTER — Encounter: Payer: Medicare Other | Attending: Physical Medicine & Rehabilitation | Admitting: Physical Medicine & Rehabilitation

## 2019-11-12 VITALS — BP 181/77 | HR 62 | Temp 98.3°F | Ht 62.0 in | Wt 96.1 lb

## 2019-11-12 DIAGNOSIS — R269 Unspecified abnormalities of gait and mobility: Secondary | ICD-10-CM | POA: Diagnosis present

## 2019-11-12 DIAGNOSIS — I10 Essential (primary) hypertension: Secondary | ICD-10-CM | POA: Insufficient documentation

## 2019-11-12 DIAGNOSIS — G8191 Hemiplegia, unspecified affecting right dominant side: Secondary | ICD-10-CM | POA: Insufficient documentation

## 2019-11-12 DIAGNOSIS — I639 Cerebral infarction, unspecified: Secondary | ICD-10-CM | POA: Diagnosis present

## 2019-11-12 DIAGNOSIS — F32A Depression, unspecified: Secondary | ICD-10-CM | POA: Insufficient documentation

## 2019-11-12 MED ORDER — FLUOXETINE HCL 20 MG PO CAPS: 20.0000 mg | ORAL_CAPSULE | Freq: Every day | ORAL | 3 refills | Status: AC

## 2019-11-12 NOTE — Progress Notes (Signed)
Subjective:    Patient ID: Elizabeth Tapia, female    DOB: 02/19/1929, 86 y.o.   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  Admit date: 10/07/2019 Discharge date: 10/29/2019  Son supplements history. At discharge, she was instructed to encourage intake, son states appetite has improved, despite not being able to get Marinol. She saw PCP. She sees Engineer, production. She does not have an appointment Neuro. Denies falls. Denies overt bleeding. She states she feels unwell. BP is elevated, elevated at home. PCP aware. She is still on modified diet. Denies abdominal discomfort. She states she feels depressed.  Therapies: 1/weel DME: Hospital bed, bedside commode Mobility: Wheelchair at all times  Pain Inventory Average Pain 0 Pain Right Now 0 My pain is no pain  LOCATION OF PAIN  no pain  BOWEL Number of stools per week: na Oral laxative use No  Type of laxative na Enema or suppository use No  History of colostomy No  Incontinent Yes   BLADDER Normal and Pads In and out cath, frequency na Able to self cath na Bladder incontinence Yes  Frequent urination No  Leakage with coughing No  Difficulty starting stream No  Incomplete bladder emptying No    Mobility ability to climb steps?  no do you drive?  no use a wheelchair needs help with transfers  Function retired I need assistance with the following:  feeding, dressing, bathing, toileting, meal prep, household duties and shopping  Neuro/Psych trouble walking  Prior Studies Any changes since last visit?  no  Physicians involved in your care Any changes since last visit?  no Primary care Tarri Fuller MD--saw last week and will see again next week Waverly Ferrari MD Vasc--sees tomorrow   Family History  Problem  Relation Age of Onset  . Alcohol abuse Father   . Alcohol abuse Sister   . Alcohol abuse Brother    Social History   Socioeconomic History  . Marital status: Unknown    Spouse name: Not on file  . Number of children: Not on file  . Years of education: Not on file  . Highest education level: Not on file  Occupational History  . Not on file  Tobacco Use  . Smoking status: Never Smoker  . Smokeless tobacco: Never Used  Substance and Sexual Activity  . Alcohol use: Not on file  . Drug use: Not on file  . Sexual activity: Not on file  Other Topics Concern  . Not on file  Social History Narrative  . Not on file   Social Determinants of Health   Financial Resource Strain:   . Difficulty of Paying Living Expenses: Not on file  Food Insecurity:   . Worried About Programme researcher, broadcasting/film/video in the Last Year: Not on file  . Ran Out of Food in the Last Year: Not on file  Transportation Needs:   . Lack of Transportation (Medical): Not on file  . Lack of Transportation (Non-Medical): Not on file  Physical Activity:   . Days of Exercise per Week: Not on file  . Minutes of Exercise per Session: Not on file  Stress:   . Feeling of Stress : Not on file  Social Connections:   . Frequency of Communication with Friends and Family: Not on file  . Frequency of Social Gatherings with Friends and Family:  Not on file  . Attends Religious Services: Not on file  . Active Member of Clubs or Organizations: Not on file  . Attends Banker Meetings: Not on file  . Marital Status: Not on file   Past Surgical History:  Procedure Laterality Date  . ENDARTERECTOMY Left 10/04/2019   Procedure: ENDARTERECTOMY CAROTID;  Surgeon: Chuck Hint, MD;  Location: [facility] OR;  Service: Vascular;  Laterality: Left;  . EYE SURGERY     Past Medical History:  Diagnosis Date  . Anxiety   . CKD (chronic kidney disease)   . Depression   . Essential tremor   . HTN (hypertension)   . IBS (irritable  bowel syndrome)    with diarrhea   BP (!) 181/77   Pulse 62   Temp 98.3 F (36.8 C)   Ht 5\' 2"  (1.575 m) Comment: last recorded  Wt 96 lb 1.9 oz (43.6 kg) Comment: last recorded, unable to stand for weight  SpO2 97%   BMI 17.58 kg/m   Opioid Risk Score:   Fall Risk Score:  `1  Depression screen PHQ 2/9  Depression screen PHQ 2/9 11/12/2019  Decreased Interest 0  Down, Depressed, Hopeless 0  PHQ - 2 Score 0  Altered sleeping 0  Tired, decreased energy 1  Change in appetite 0  Feeling bad or failure about yourself  0  Trouble concentrating 1  Moving slowly or fidgety/restless 0  Suicidal thoughts 0  PHQ-9 Score 2    Review of Systems  Constitutional:       Does not feel well  HENT: Negative.   Eyes: Negative.   Respiratory: Negative.   Cardiovascular: Negative.   Gastrointestinal: Negative.   Endocrine: Negative.   Genitourinary:       Incont, uses pads  Musculoskeletal: Positive for gait problem.  Skin: Negative.   Allergic/Immunologic: Negative.   Neurological: Positive for weakness.  Hematological: Negative.   Psychiatric/Behavioral: Positive for confusion.       Depressed  All other systems reviewed and are negative.     Objective:   Physical Exam  Constitutional: No distress . Vital signs reviewed. HENT: Normocephalic.  Atraumatic. Eyes: EOMI. No discharge. Cardiovascular: No JVD.   Respiratory: Normal effort.  No stridor.   GI: Non-distended.   Skin: Warm and dry.  Intact. Neck incision healed. Psych: Normal mood.  Normal behavior. Musc: No edema in extremities.  No tenderness in extremities. 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  1.  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 Pt s/p left CEA on 10/04/19.             Cont therapies  Follow up with Vasc  Follow up up with Neuro - needs appointment Do not want to try oral anti-spastic medications due to lethargy and cognitive side effects.  Discussed risks/benefit of Botulinum toxin injection, patient and son would like to proceed. Will schedule for Xeomin injection:  Right  Biceps: 25 units   FCU: 12.5   FCR: 12.5   FDS 25   FDP 25  2. Anxiety/Depression  Prozaac increased  to 20mg   3. HTN:              Inderal 60 mg bid  Very elevated today - discussed recording at home, which have also been elevated.  States PCP following, with appt in 2 weeks.  Encouraged follow up with PCP sooner.  4.  Post stroke dysphagia  Cont therapies             Cont D1 thins, advancing  5. Gait abnormality  Cont therapies  Cont wheelchair for safety

## 2019-11-12 NOTE — Addendum Note (Signed)
Addended by: Maryla Morrow A on: 11/12/2019 11:23 AM   Modules accepted: Orders

## 2019-11-13 ENCOUNTER — Encounter: Payer: Medicare Other | Admitting: Vascular Surgery

## 2019-11-20 ENCOUNTER — Encounter: Payer: Medicare Other | Admitting: Vascular Surgery

## 2019-12-05 ENCOUNTER — Encounter: Payer: Medicare Other | Attending: Physical Medicine & Rehabilitation | Admitting: Physical Medicine & Rehabilitation

## 2019-12-05 DIAGNOSIS — G8191 Hemiplegia, unspecified affecting right dominant side: Secondary | ICD-10-CM | POA: Insufficient documentation

## 2019-12-05 DIAGNOSIS — I10 Essential (primary) hypertension: Secondary | ICD-10-CM | POA: Insufficient documentation

## 2019-12-05 DIAGNOSIS — R269 Unspecified abnormalities of gait and mobility: Secondary | ICD-10-CM | POA: Insufficient documentation

## 2019-12-05 DIAGNOSIS — I639 Cerebral infarction, unspecified: Secondary | ICD-10-CM | POA: Insufficient documentation

## 2019-12-05 DIAGNOSIS — F32A Depression, unspecified: Secondary | ICD-10-CM | POA: Insufficient documentation

## 2020-01-04 DEATH — deceased

## 2021-05-26 IMAGING — CT CT HEAD W/O CM
4 series · 16 of 47 positions shown, 18 images · non-contrast
Comparison: CT and MRI 10/01/2019

CLINICAL DATA: Stroke follow-up.

EXAM:
CT HEAD WITHOUT CONTRAST
TECHNIQUE: Contiguous axial images were obtained from the base of the skull
through the vertex without intravenous contrast.

[Series 3: head wo · axial · 0.43mm/px · z∈[-212,-82]mm · 7 of 36 slices shown, 9 images]
[im 5/36  brain]
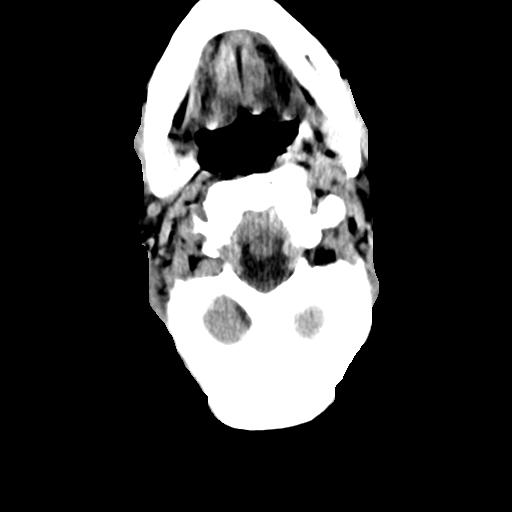
[im 5/36  bone]
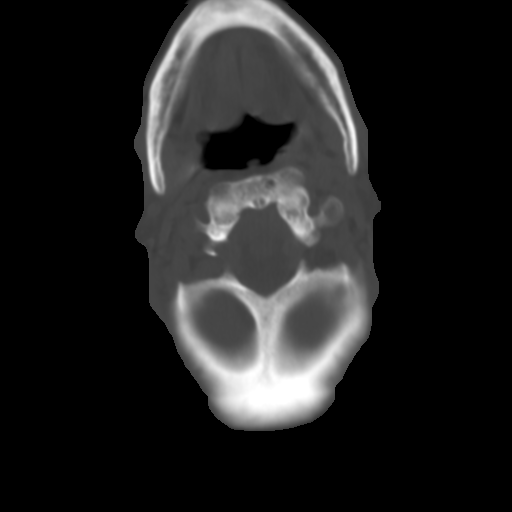
[im 9/36  brain]
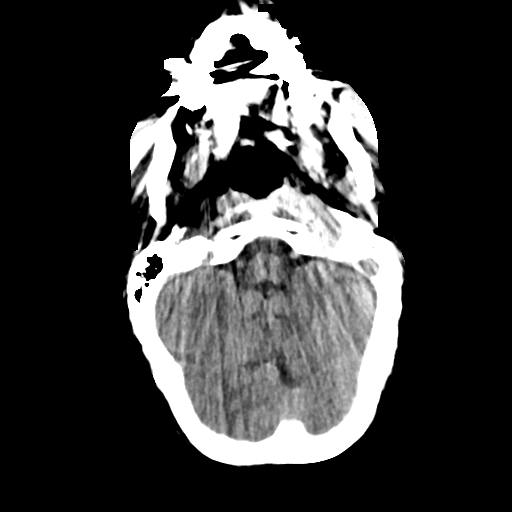
[im 14/36  brain]
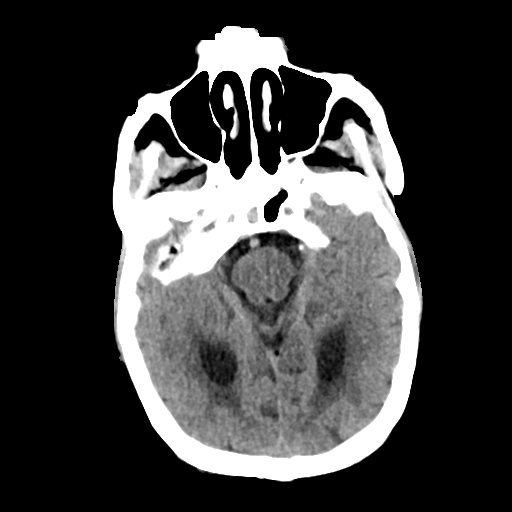
[im 18/36  brain]
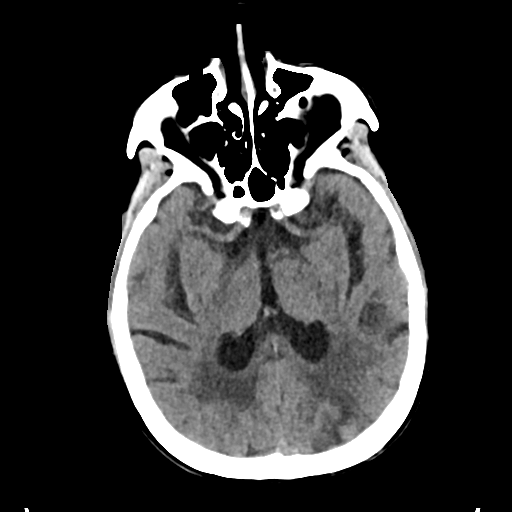
[im 22/36  brain]
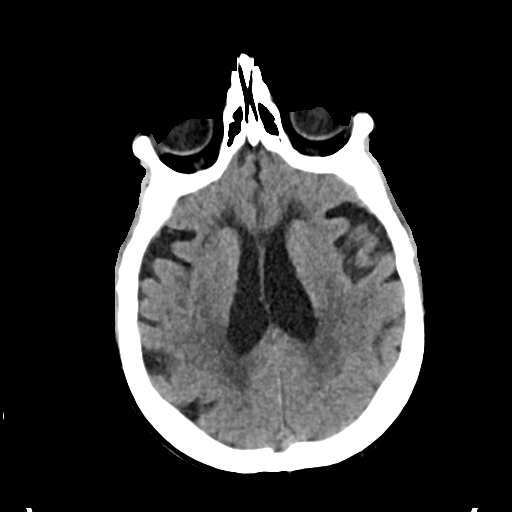
[im 22/36  bone]
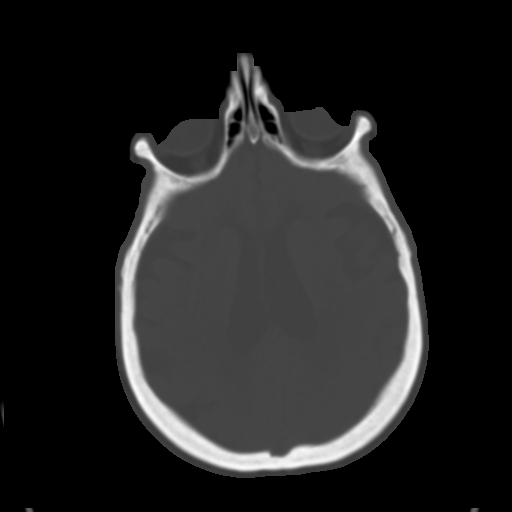
[im 27/36  brain]
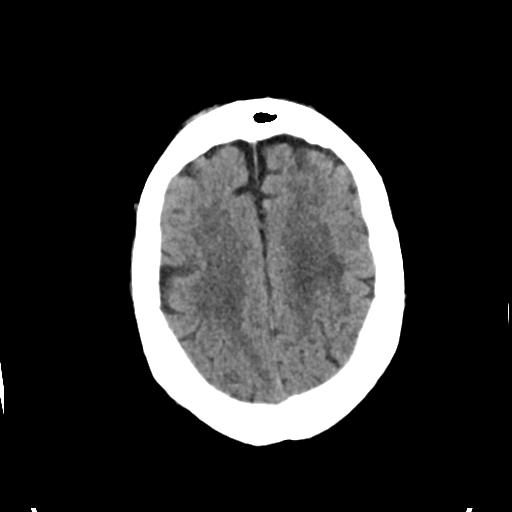
[im 31/36  brain]
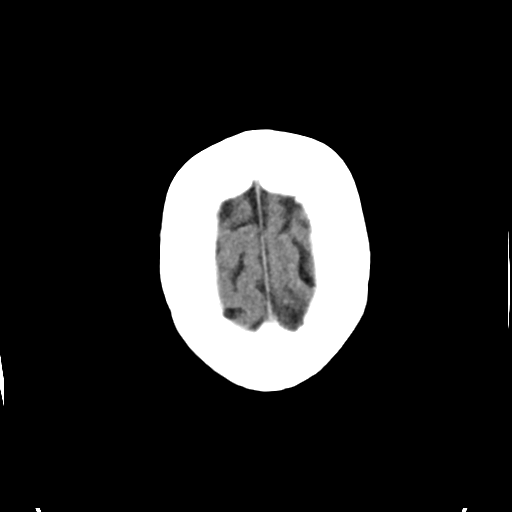

[Series 4: head bone · axial · 0.43mm/px · z∈[-216,-180]mm · 3 of 89 slices shown]
[im 9/89  bone]
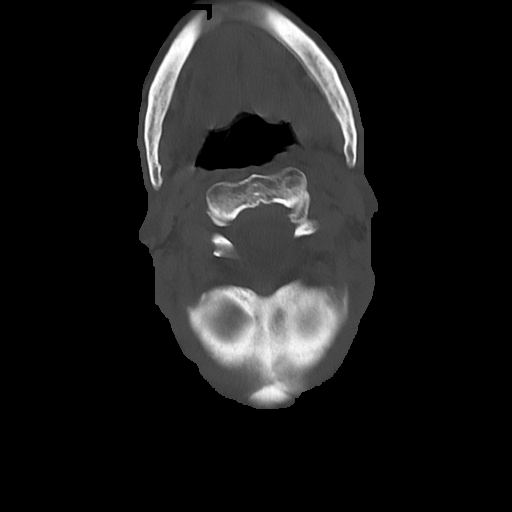
[im 18/89  bone]
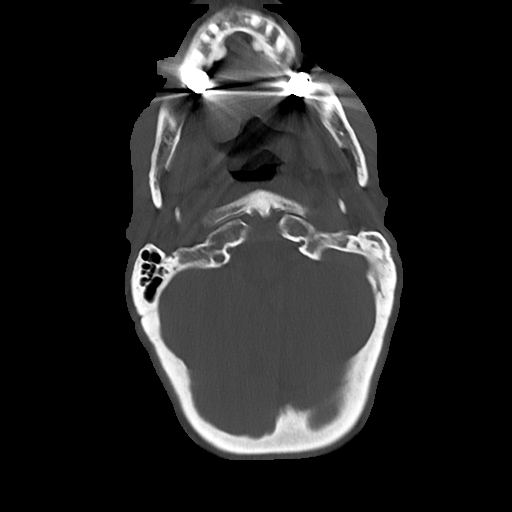
[im 27/89  bone]
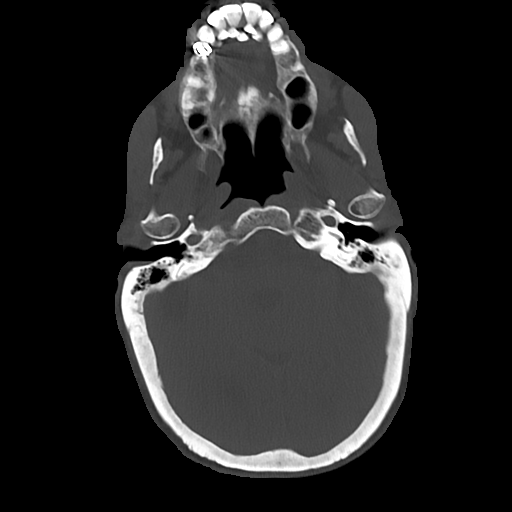

[Series 5: cor soft · coronal · 0.32mm/px · 3 of 65 slices shown]
[im 25/65  brain]
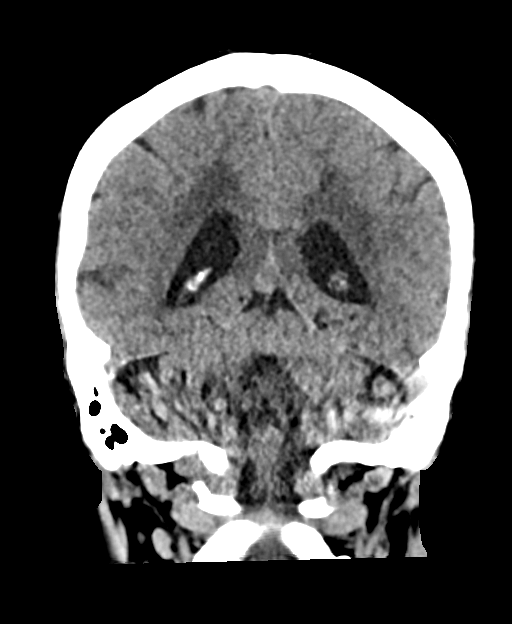
[im 30/65  brain]
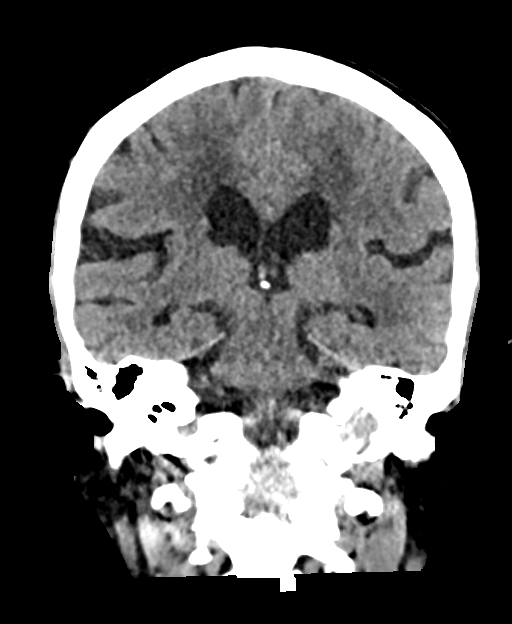
[im 36/65  brain]
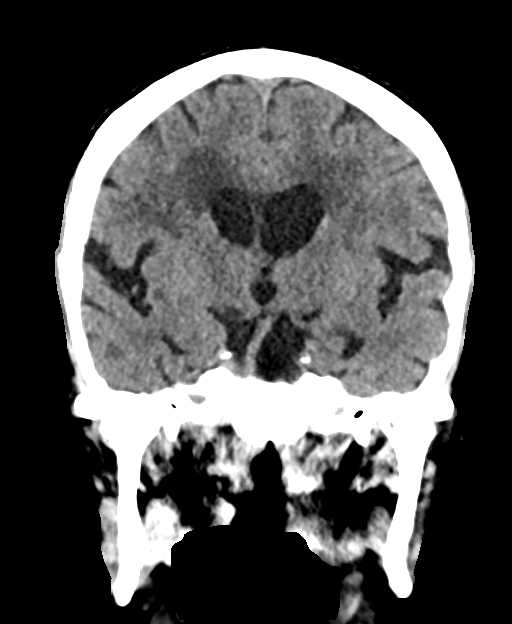

[Series 6: sag soft · sagittal · 0.39mm/px · 3 of 55 slices shown]
[im 19/55  brain]
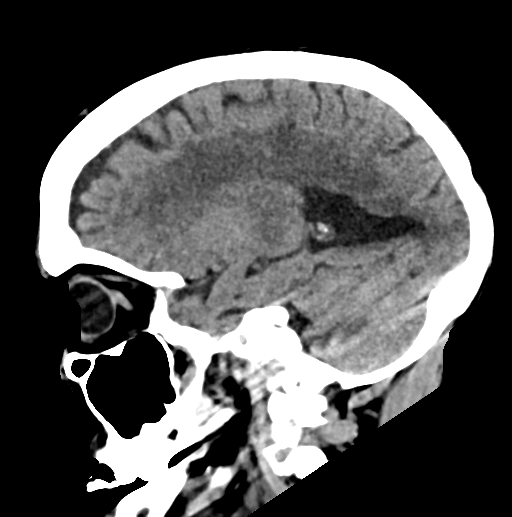
[im 28/55  brain]
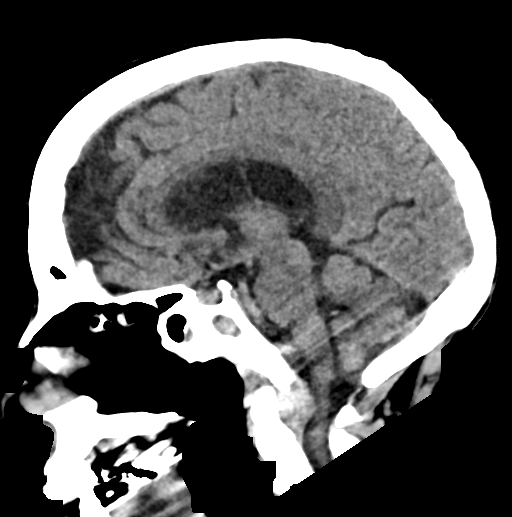
[im 37/55  brain]
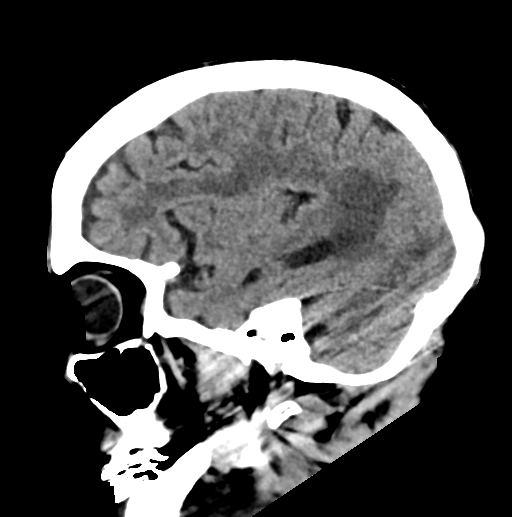

[16 of 47 positions shown; findings below may reference images not displayed]

FINDINGS: Brain: Similar appearance of high left frontal and left parietal
hypodensities with areas of loss of gray-white differentiation,
compatible with acute/subacute infarcts better seen on recent [facility]
MRI. Similar small area of relative hyperdensity in the left
parietal infarct. No new hemorrhage. No progressive mass effect. No
midline shift. Basal cisterns are patent. No evidence of new large
vascular territory infarct. No hydrocephalus. Similar additional
scattered white matter hypoattenuation, compatible with chronic
microvascular ischemic disease.

Vascular: Calcific atherosclerosis without evidence of hyperdense
vessel.

Skull: Normal. Negative for fracture or focal lesion.

Sinuses/Orbits: Similar chronic right sphenoid sinus disease.
Remaining sinuses are clear.

Other: Similar underpneumatized left mastoid air cells with trace
left mastoid effusion.
IMPRESSION: 1. Similar findings of acute/subacute left frontoparietal infarcts.
2. Similar small area of relative hyperdensity in the left parietal
infarct may represent preserved brain parenchyma (favored) or a
small amount of hemorrhage. No new hemorrhage. No progressive mass
effect.
